# Patient Record
Sex: Male | Born: 1955 | Race: White | Hispanic: No | Marital: Single | Smoking: Never smoker
Health system: Southern US, Community
[De-identification: ages and names within clinical notes are randomized; demographics above are authoritative.]

## PROBLEM LIST (undated history)

## (undated) DIAGNOSIS — G8929 Other chronic pain: Secondary | ICD-10-CM

## (undated) DIAGNOSIS — G4730 Sleep apnea, unspecified: Secondary | ICD-10-CM

## (undated) HISTORY — PX: ABLATION OF DYSRHYTHMIC FOCUS: SHX254

## (undated) NOTE — *Deleted (*Deleted)
1030:Pt awoken for meds, pt asked to go back to sleep. Pt attempting to take a nap now. Will continue to monitor pt.

---

## 2020-02-21 ENCOUNTER — Other Ambulatory Visit: Payer: Self-pay

## 2020-02-21 ENCOUNTER — Inpatient Hospital Stay (HOSPITAL_COMMUNITY)
Admission: EM | Admit: 2020-02-21 | Discharge: 2020-03-27 | DRG: 917 | Disposition: A | Discharge status: Home / Self Care | Payer: Medicare Other | Attending: Family Medicine | Admitting: Family Medicine

## 2020-02-21 ENCOUNTER — Emergency Department (HOSPITAL_COMMUNITY): Payer: Medicare Other

## 2020-02-21 ENCOUNTER — Encounter (HOSPITAL_COMMUNITY): Payer: Self-pay | Admitting: Pulmonary Disease

## 2020-02-21 DIAGNOSIS — Z6836 Body mass index (BMI) 36.0-36.9, adult: Secondary | ICD-10-CM

## 2020-02-21 DIAGNOSIS — K625 Hemorrhage of anus and rectum: Secondary | ICD-10-CM | POA: Diagnosis not present

## 2020-02-21 DIAGNOSIS — D509 Iron deficiency anemia, unspecified: Secondary | ICD-10-CM | POA: Diagnosis present

## 2020-02-21 DIAGNOSIS — C9110 Chronic lymphocytic leukemia of B-cell type not having achieved remission: Secondary | ICD-10-CM | POA: Diagnosis present

## 2020-02-21 DIAGNOSIS — F4321 Adjustment disorder with depressed mood: Secondary | ICD-10-CM | POA: Diagnosis present

## 2020-02-21 DIAGNOSIS — S61512A Laceration without foreign body of left wrist, initial encounter: Secondary | ICD-10-CM | POA: Diagnosis present

## 2020-02-21 DIAGNOSIS — F1210 Cannabis abuse, uncomplicated: Secondary | ICD-10-CM | POA: Diagnosis present

## 2020-02-21 DIAGNOSIS — Z86711 Personal history of pulmonary embolism: Secondary | ICD-10-CM

## 2020-02-21 DIAGNOSIS — J984 Other disorders of lung: Secondary | ICD-10-CM | POA: Diagnosis present

## 2020-02-21 DIAGNOSIS — S066X9A Traumatic subarachnoid hemorrhage with loss of consciousness of unspecified duration, initial encounter: Secondary | ICD-10-CM | POA: Diagnosis present

## 2020-02-21 DIAGNOSIS — S61511D Laceration without foreign body of right wrist, subsequent encounter: Secondary | ICD-10-CM

## 2020-02-21 DIAGNOSIS — I110 Hypertensive heart disease with heart failure: Secondary | ICD-10-CM | POA: Diagnosis present

## 2020-02-21 DIAGNOSIS — R112 Nausea with vomiting, unspecified: Secondary | ICD-10-CM | POA: Diagnosis not present

## 2020-02-21 DIAGNOSIS — I482 Chronic atrial fibrillation, unspecified: Secondary | ICD-10-CM | POA: Diagnosis present

## 2020-02-21 DIAGNOSIS — E871 Hypo-osmolality and hyponatremia: Secondary | ICD-10-CM | POA: Diagnosis present

## 2020-02-21 DIAGNOSIS — W19XXXA Unspecified fall, initial encounter: Secondary | ICD-10-CM | POA: Diagnosis present

## 2020-02-21 DIAGNOSIS — K5900 Constipation, unspecified: Secondary | ICD-10-CM | POA: Diagnosis not present

## 2020-02-21 DIAGNOSIS — I252 Old myocardial infarction: Secondary | ICD-10-CM

## 2020-02-21 DIAGNOSIS — F1027 Alcohol dependence with alcohol-induced persisting dementia: Secondary | ICD-10-CM | POA: Diagnosis present

## 2020-02-21 DIAGNOSIS — X789XXA Intentional self-harm by unspecified sharp object, initial encounter: Secondary | ICD-10-CM | POA: Diagnosis present

## 2020-02-21 DIAGNOSIS — I4892 Unspecified atrial flutter: Secondary | ICD-10-CM | POA: Diagnosis present

## 2020-02-21 DIAGNOSIS — T424X2A Poisoning by benzodiazepines, intentional self-harm, initial encounter: Principal | ICD-10-CM | POA: Diagnosis present

## 2020-02-21 DIAGNOSIS — F332 Major depressive disorder, recurrent severe without psychotic features: Secondary | ICD-10-CM | POA: Diagnosis present

## 2020-02-21 DIAGNOSIS — G309 Alzheimer's disease, unspecified: Secondary | ICD-10-CM | POA: Diagnosis present

## 2020-02-21 DIAGNOSIS — E669 Obesity, unspecified: Secondary | ICD-10-CM | POA: Diagnosis present

## 2020-02-21 DIAGNOSIS — J4540 Moderate persistent asthma, uncomplicated: Secondary | ICD-10-CM | POA: Diagnosis present

## 2020-02-21 DIAGNOSIS — R45850 Homicidal ideations: Secondary | ICD-10-CM | POA: Diagnosis present

## 2020-02-21 DIAGNOSIS — Z7901 Long term (current) use of anticoagulants: Secondary | ICD-10-CM

## 2020-02-21 DIAGNOSIS — T1491XA Suicide attempt, initial encounter: Secondary | ICD-10-CM | POA: Diagnosis present

## 2020-02-21 DIAGNOSIS — M5030 Other cervical disc degeneration, unspecified cervical region: Secondary | ICD-10-CM | POA: Diagnosis present

## 2020-02-21 DIAGNOSIS — Z7982 Long term (current) use of aspirin: Secondary | ICD-10-CM

## 2020-02-21 DIAGNOSIS — Z23 Encounter for immunization: Secondary | ICD-10-CM

## 2020-02-21 DIAGNOSIS — G2581 Restless legs syndrome: Secondary | ICD-10-CM | POA: Diagnosis present

## 2020-02-21 DIAGNOSIS — S06369A Traumatic hemorrhage of cerebrum, unspecified, with loss of consciousness of unspecified duration, initial encounter: Secondary | ICD-10-CM | POA: Diagnosis present

## 2020-02-21 DIAGNOSIS — I5032 Chronic diastolic (congestive) heart failure: Secondary | ICD-10-CM | POA: Diagnosis present

## 2020-02-21 DIAGNOSIS — I609 Nontraumatic subarachnoid hemorrhage, unspecified: Secondary | ICD-10-CM

## 2020-02-21 DIAGNOSIS — M549 Dorsalgia, unspecified: Secondary | ICD-10-CM | POA: Diagnosis present

## 2020-02-21 DIAGNOSIS — F0280 Dementia in other diseases classified elsewhere without behavioral disturbance: Secondary | ICD-10-CM | POA: Diagnosis present

## 2020-02-21 DIAGNOSIS — R159 Full incontinence of feces: Secondary | ICD-10-CM | POA: Diagnosis not present

## 2020-02-21 DIAGNOSIS — T50902A Poisoning by unspecified drugs, medicaments and biological substances, intentional self-harm, initial encounter: Secondary | ICD-10-CM | POA: Diagnosis not present

## 2020-02-21 DIAGNOSIS — K5730 Diverticulosis of large intestine without perforation or abscess without bleeding: Secondary | ICD-10-CM | POA: Diagnosis present

## 2020-02-21 DIAGNOSIS — G928 Other toxic encephalopathy: Secondary | ICD-10-CM | POA: Diagnosis present

## 2020-02-21 DIAGNOSIS — N179 Acute kidney failure, unspecified: Secondary | ICD-10-CM | POA: Diagnosis present

## 2020-02-21 DIAGNOSIS — R110 Nausea: Secondary | ICD-10-CM

## 2020-02-21 DIAGNOSIS — S51812A Laceration without foreign body of left forearm, initial encounter: Secondary | ICD-10-CM | POA: Diagnosis present

## 2020-02-21 DIAGNOSIS — R1115 Cyclical vomiting syndrome unrelated to migraine: Secondary | ICD-10-CM | POA: Diagnosis not present

## 2020-02-21 DIAGNOSIS — Z7951 Long term (current) use of inhaled steroids: Secondary | ICD-10-CM

## 2020-02-21 DIAGNOSIS — Z85828 Personal history of other malignant neoplasm of skin: Secondary | ICD-10-CM

## 2020-02-21 DIAGNOSIS — I629 Nontraumatic intracranial hemorrhage, unspecified: Secondary | ICD-10-CM

## 2020-02-21 DIAGNOSIS — S51811A Laceration without foreign body of right forearm, initial encounter: Secondary | ICD-10-CM | POA: Diagnosis present

## 2020-02-21 DIAGNOSIS — Z820 Family history of epilepsy and other diseases of the nervous system: Secondary | ICD-10-CM

## 2020-02-21 DIAGNOSIS — R160 Hepatomegaly, not elsewhere classified: Secondary | ICD-10-CM | POA: Diagnosis present

## 2020-02-21 DIAGNOSIS — E512 Wernicke's encephalopathy: Secondary | ICD-10-CM | POA: Diagnosis present

## 2020-02-21 DIAGNOSIS — Z803 Family history of malignant neoplasm of breast: Secondary | ICD-10-CM

## 2020-02-21 DIAGNOSIS — I509 Heart failure, unspecified: Secondary | ICD-10-CM

## 2020-02-21 DIAGNOSIS — Z20822 Contact with and (suspected) exposure to covid-19: Secondary | ICD-10-CM | POA: Diagnosis present

## 2020-02-21 DIAGNOSIS — R441 Visual hallucinations: Secondary | ICD-10-CM | POA: Diagnosis present

## 2020-02-21 DIAGNOSIS — F1021 Alcohol dependence, in remission: Secondary | ICD-10-CM | POA: Diagnosis present

## 2020-02-21 DIAGNOSIS — Z833 Family history of diabetes mellitus: Secondary | ICD-10-CM

## 2020-02-21 DIAGNOSIS — K219 Gastro-esophageal reflux disease without esophagitis: Secondary | ICD-10-CM | POA: Diagnosis present

## 2020-02-21 DIAGNOSIS — G8929 Other chronic pain: Secondary | ICD-10-CM | POA: Diagnosis present

## 2020-02-21 DIAGNOSIS — T50902D Poisoning by unspecified drugs, medicaments and biological substances, intentional self-harm, subsequent encounter: Secondary | ICD-10-CM | POA: Diagnosis not present

## 2020-02-21 DIAGNOSIS — I272 Pulmonary hypertension, unspecified: Secondary | ICD-10-CM | POA: Diagnosis present

## 2020-02-21 DIAGNOSIS — G629 Polyneuropathy, unspecified: Secondary | ICD-10-CM | POA: Diagnosis present

## 2020-02-21 DIAGNOSIS — R590 Localized enlarged lymph nodes: Secondary | ICD-10-CM | POA: Diagnosis present

## 2020-02-21 DIAGNOSIS — F17290 Nicotine dependence, other tobacco product, uncomplicated: Secondary | ICD-10-CM | POA: Diagnosis present

## 2020-02-21 DIAGNOSIS — K229 Disease of esophagus, unspecified: Secondary | ICD-10-CM | POA: Diagnosis not present

## 2020-02-21 DIAGNOSIS — M4802 Spinal stenosis, cervical region: Secondary | ICD-10-CM | POA: Diagnosis present

## 2020-02-21 DIAGNOSIS — S61511A Laceration without foreign body of right wrist, initial encounter: Secondary | ICD-10-CM | POA: Diagnosis present

## 2020-02-21 DIAGNOSIS — R001 Bradycardia, unspecified: Secondary | ICD-10-CM | POA: Diagnosis not present

## 2020-02-21 DIAGNOSIS — G4733 Obstructive sleep apnea (adult) (pediatric): Secondary | ICD-10-CM | POA: Diagnosis present

## 2020-02-21 DIAGNOSIS — S61512D Laceration without foreign body of left wrist, subsequent encounter: Secondary | ICD-10-CM | POA: Diagnosis not present

## 2020-02-21 DIAGNOSIS — I959 Hypotension, unspecified: Secondary | ICD-10-CM | POA: Diagnosis not present

## 2020-02-21 DIAGNOSIS — Z8249 Family history of ischemic heart disease and other diseases of the circulatory system: Secondary | ICD-10-CM

## 2020-02-21 DIAGNOSIS — M62838 Other muscle spasm: Secondary | ICD-10-CM | POA: Diagnosis present

## 2020-02-21 DIAGNOSIS — R1310 Dysphagia, unspecified: Secondary | ICD-10-CM | POA: Diagnosis not present

## 2020-02-21 DIAGNOSIS — E876 Hypokalemia: Secondary | ICD-10-CM | POA: Diagnosis not present

## 2020-02-21 DIAGNOSIS — F5104 Psychophysiologic insomnia: Secondary | ICD-10-CM | POA: Diagnosis present

## 2020-02-21 HISTORY — DX: Sleep apnea, unspecified: G47.30

## 2020-02-21 HISTORY — DX: Other chronic pain: G89.29

## 2020-02-21 LAB — I-STAT ARTERIAL BLOOD GAS, ED
Acid-Base Excess: 0 mmol/L (ref 0.0–2.0)
Bicarbonate: 24.5 mmol/L (ref 20.0–28.0)
Calcium, Ion: 1.19 mmol/L (ref 1.15–1.40)
HCT: 33 % — ABNORMAL LOW (ref 39.0–52.0)
Hemoglobin: 11.2 g/dL — ABNORMAL LOW (ref 13.0–17.0)
O2 Saturation: 93 %
Potassium: 2.9 mmol/L — ABNORMAL LOW (ref 3.5–5.1)
Sodium: 133 mmol/L — ABNORMAL LOW (ref 135–145)
TCO2: 26 mmol/L (ref 22–32)
pCO2 arterial: 37 mmHg (ref 32.0–48.0)
pH, Arterial: 7.429 (ref 7.350–7.450)
pO2, Arterial: 65 mmHg — ABNORMAL LOW (ref 83.0–108.0)

## 2020-02-21 LAB — COMPREHENSIVE METABOLIC PANEL
ALT: 15 U/L (ref 0–44)
AST: 29 U/L (ref 15–41)
Albumin: 3.3 g/dL — ABNORMAL LOW (ref 3.5–5.0)
Alkaline Phosphatase: 56 U/L (ref 38–126)
Anion gap: 11 (ref 5–15)
BUN: 16 mg/dL (ref 8–23)
CO2: 22 mmol/L (ref 22–32)
Calcium: 8.3 mg/dL — ABNORMAL LOW (ref 8.9–10.3)
Chloride: 93 mmol/L — ABNORMAL LOW (ref 98–111)
Creatinine, Ser: 0.99 mg/dL (ref 0.61–1.24)
GFR calc non Af Amer: >60 mL/min (ref >60)
Glucose, Bld: 137 mg/dL — ABNORMAL HIGH (ref 70–99)
Potassium: 3.3 mmol/L — ABNORMAL LOW (ref 3.5–5.1)
Sodium: 126 mmol/L — ABNORMAL LOW (ref 135–145)
Total Bilirubin: 1.6 mg/dL — ABNORMAL HIGH (ref 0.3–1.2)
Total Protein: 5.8 g/dL — ABNORMAL LOW (ref 6.5–8.1)

## 2020-02-21 LAB — RAPID URINE DRUG SCREEN, HOSP PERFORMED
Amphetamines: NOT DETECTED
Barbiturates: NOT DETECTED
Benzodiazepines: NOT DETECTED
Cocaine: NOT DETECTED
Opiates: NOT DETECTED
Tetrahydrocannabinol: POSITIVE — AB

## 2020-02-21 LAB — CBC
HCT: 33 % — ABNORMAL LOW (ref 39.0–52.0)
Hemoglobin: 10.7 g/dL — ABNORMAL LOW (ref 13.0–17.0)
MCH: 28.2 pg (ref 26.0–34.0)
MCHC: 32.4 g/dL (ref 30.0–36.0)
MCV: 87.1 fL (ref 80.0–100.0)
Platelets: 204 10*3/uL (ref 150–400)
RBC: 3.79 MIL/uL — ABNORMAL LOW (ref 4.22–5.81)
RDW: 15 % (ref 11.5–15.5)
WBC: 27.1 10*3/uL — ABNORMAL HIGH (ref 4.0–10.5)
nRBC: 0 % (ref 0.0–0.2)

## 2020-02-21 LAB — GLUCOSE, CAPILLARY: Glucose-Capillary: 99 mg/dL (ref 70–99)

## 2020-02-21 LAB — RESPIRATORY PANEL BY RT PCR (FLU A&B, COVID)
Influenza A by PCR: NEGATIVE
Influenza B by PCR: NEGATIVE
SARS Coronavirus 2 by RT PCR: NEGATIVE

## 2020-02-21 LAB — CBG MONITORING, ED: Glucose-Capillary: 138 mg/dL — ABNORMAL HIGH (ref 70–99)

## 2020-02-21 LAB — SALICYLATE LEVEL: Salicylate Lvl: <7 mg/dL — ABNORMAL LOW (ref 7.0–30.0)

## 2020-02-21 LAB — SODIUM: Sodium: 133 mmol/L — ABNORMAL LOW (ref 135–145)

## 2020-02-21 LAB — PROTIME-INR
INR: 1.3 — ABNORMAL HIGH (ref 0.8–1.2)
INR: 1 (ref 0.8–1.2)
Prothrombin Time: 15.9 seconds — ABNORMAL HIGH (ref 11.4–15.2)
Prothrombin Time: 13.2 seconds (ref 11.4–15.2)

## 2020-02-21 LAB — ACETAMINOPHEN LEVEL: Acetaminophen (Tylenol), Serum: <10 ug/mL — ABNORMAL LOW (ref 10–30)

## 2020-02-21 LAB — MAGNESIUM: Magnesium: 2 mg/dL (ref 1.7–2.4)

## 2020-02-21 LAB — PHOSPHORUS: Phosphorus: 4 mg/dL (ref 2.5–4.6)

## 2020-02-21 LAB — HIV ANTIBODY (ROUTINE TESTING W REFLEX): HIV Screen 4th Generation wRfx: NONREACTIVE

## 2020-02-21 LAB — BRAIN NATRIURETIC PEPTIDE: B Natriuretic Peptide: 359.3 pg/mL — ABNORMAL HIGH (ref 0.0–100.0)

## 2020-02-21 LAB — MRSA PCR SCREENING: MRSA by PCR: NEGATIVE

## 2020-02-21 LAB — ETHANOL: Alcohol, Ethyl (B): <10 mg/dL (ref <10)

## 2020-02-21 MED ORDER — ALBUTEROL SULFATE (2.5 MG/3ML) 0.083% IN NEBU: 2.5000 mg | INHALATION_SOLUTION | RESPIRATORY_TRACT | Status: DC | PRN

## 2020-02-21 MED ORDER — DOPAMINE-DEXTROSE 3.2-5 MG/ML-% IV SOLN
0.0000 ug/kg/min | INTRAVENOUS | Status: DC
  Administered 2020-02-21: 5 ug/kg/min via INTRAVENOUS
  Filled 2020-02-21: qty 250

## 2020-02-21 MED ORDER — ONDANSETRON HCL 4 MG/2ML IJ SOLN
4.0000 mg | Freq: Four times a day (QID) | INTRAMUSCULAR | Status: DC | PRN
  Administered 2020-02-21 – 2020-03-27 (×23): 4 mg via INTRAVENOUS
  Filled 2020-02-21 (×26): qty 2

## 2020-02-21 MED ORDER — PROTHROMBIN COMPLEX CONC HUMAN 500 UNITS IV KIT
4713.0000 [IU] | PACK | Status: AC
  Administered 2020-02-21: 4713 [IU] via INTRAVENOUS
  Filled 2020-02-21: qty 4213

## 2020-02-21 MED ORDER — POTASSIUM CHLORIDE 10 MEQ/100ML IV SOLN: 10.0000 meq | INTRAVENOUS | Status: AC

## 2020-02-21 MED ORDER — DOCUSATE SODIUM 100 MG PO CAPS
100.0000 mg | ORAL_CAPSULE | Freq: Two times a day (BID) | ORAL | Status: DC | PRN
  Administered 2020-02-27 – 2020-03-17 (×5): 100 mg via ORAL
  Filled 2020-02-21 (×5): qty 1

## 2020-02-21 MED ORDER — NALOXONE HCL 0.4 MG/ML IJ SOLN
0.4000 mg | INTRAMUSCULAR | Status: DC | PRN
  Administered 2020-02-21: 0.4 mg via INTRAVENOUS
  Filled 2020-02-21: qty 1

## 2020-02-21 MED ORDER — DEXMEDETOMIDINE HCL IN NACL 400 MCG/100ML IV SOLN
0.2000 ug/kg/h | INTRAVENOUS | Status: DC
  Administered 2020-02-21: 0.2 ug/kg/h via INTRAVENOUS
  Filled 2020-02-21: qty 100

## 2020-02-21 MED ORDER — MIDAZOLAM HCL 2 MG/2ML IJ SOLN
INTRAMUSCULAR | Status: AC
  Filled 2020-02-21: qty 4

## 2020-02-21 MED ORDER — LIDOCAINE-EPINEPHRINE 1 %-1:100000 IJ SOLN
20.0000 mL | Freq: Once | INTRAMUSCULAR | Status: AC
  Administered 2020-02-21: 20 mL
  Filled 2020-02-21: qty 1

## 2020-02-21 MED ORDER — FAMOTIDINE IN NACL 20-0.9 MG/50ML-% IV SOLN
20.0000 mg | Freq: Two times a day (BID) | INTRAVENOUS | Status: DC
  Administered 2020-02-21 – 2020-02-23 (×5): 20 mg via INTRAVENOUS
  Filled 2020-02-21 (×5): qty 50

## 2020-02-21 MED ORDER — INFLUENZA VAC A&B SA ADJ QUAD 0.5 ML IM PRSY
0.5000 mL | PREFILLED_SYRINGE | INTRAMUSCULAR | Status: AC
  Administered 2020-02-22: 0.5 mL via INTRAMUSCULAR
  Filled 2020-02-21: qty 0.5

## 2020-02-21 MED ORDER — CHLORHEXIDINE GLUCONATE CLOTH 2 % EX PADS
6.0000 | MEDICATED_PAD | Freq: Every day | CUTANEOUS | Status: DC
  Administered 2020-02-22: 6 via TOPICAL

## 2020-02-21 MED ORDER — POTASSIUM CHLORIDE CRYS ER 20 MEQ PO TBCR
20.0000 meq | EXTENDED_RELEASE_TABLET | Freq: Once | ORAL | Status: AC
  Administered 2020-02-21: 20 meq via ORAL
  Filled 2020-02-21: qty 1

## 2020-02-21 MED ORDER — FENTANYL CITRATE (PF) 100 MCG/2ML IJ SOLN
INTRAMUSCULAR | Status: DC
Start: 2020-02-21 — End: 2020-02-21
  Filled 2020-02-21: qty 2

## 2020-02-21 MED ORDER — NALOXONE HCL 0.4 MG/ML IJ SOLN: 0.4000 mg | INTRAMUSCULAR | Status: DC | PRN

## 2020-02-21 MED ORDER — ETOMIDATE 2 MG/ML IV SOLN
INTRAVENOUS | Status: AC
  Filled 2020-02-21: qty 20

## 2020-02-21 MED ORDER — FENTANYL CITRATE (PF) 100 MCG/2ML IJ SOLN
25.0000 ug | INTRAMUSCULAR | Status: DC | PRN
  Administered 2020-02-21 (×3): 100 ug via INTRAVENOUS
  Filled 2020-02-21 (×3): qty 2

## 2020-02-21 MED ORDER — TETANUS-DIPHTH-ACELL PERTUSSIS 5-2.5-18.5 LF-MCG/0.5 IM SUSP
0.5000 mL | Freq: Once | INTRAMUSCULAR | Status: AC
  Administered 2020-02-21: 0.5 mL via INTRAMUSCULAR
  Filled 2020-02-21: qty 0.5

## 2020-02-21 MED ORDER — POLYETHYLENE GLYCOL 3350 17 G PO PACK
17.0000 g | PACK | Freq: Every day | ORAL | Status: DC | PRN
  Administered 2020-02-25 – 2020-03-03 (×7): 17 g via ORAL
  Filled 2020-02-21 (×9): qty 1

## 2020-02-21 MED ORDER — ROCURONIUM BROMIDE 10 MG/ML (PF) SYRINGE
PREFILLED_SYRINGE | INTRAVENOUS | Status: AC
  Filled 2020-02-21: qty 10

## 2020-02-21 MED ORDER — ENSURE ENLIVE PO LIQD
237.0000 mL | Freq: Two times a day (BID) | ORAL | Status: DC
  Administered 2020-02-21 – 2020-02-22 (×2): 237 mL via ORAL

## 2020-02-21 NOTE — Consult Note (Signed)
Reason for Consult: Intracranial hemorrhage Referring Physician: Emergency department  David Oneill is an 64 y.o. male.  HPI: 64 year old male status post likely suicide attempt.  Unclear as to whether there was any significant trauma to his head.  Patient amnestic to events and cannot provide history.  Patient's mental status is gradually improved.  He is awake and conversant but remains confused.  No past medical history on file.    No family history on file.  Social History:  has no history on file for tobacco use, alcohol use, and drug use.  Allergies: Not on File  Medications: I have reviewed the patient's current medications.  Results for orders placed or performed during the hospital encounter of 02/21/20 (from the past 48 hour(s))  Comprehensive metabolic panel     Status: Abnormal   Collection Time: 02/21/20  6:17 AM  Result Value Ref Range   Sodium 126 (L) 135 - 145 mmol/L   Potassium 3.3 (L) 3.5 - 5.1 mmol/L   Chloride 93 (L) 98 - 111 mmol/L   CO2 22 22 - 32 mmol/L   Glucose, Bld 137 (H) 70 - 99 mg/dL    Comment: Glucose reference range applies only to samples taken after fasting for at least 8 hours.   BUN 16 8 - 23 mg/dL   Creatinine, Ser 0.99 0.61 - 1.24 mg/dL   Calcium 8.3 (L) 8.9 - 10.3 mg/dL   Total Protein 5.8 (L) 6.5 - 8.1 g/dL   Albumin 3.3 (L) 3.5 - 5.0 g/dL   AST 29 15 - 41 U/L   ALT 15 0 - 44 U/L   Alkaline Phosphatase 56 38 - 126 U/L   Total Bilirubin 1.6 (H) 0.3 - 1.2 mg/dL   GFR calc non Af Amer >60 >60 mL/min   Anion gap 11 5 - 15    Comment: Performed at [facility], [place] [address] [place], [place] [address]  Ethanol     Status: None   Collection Time: 02/21/20  6:17 AM  Result Value Ref Range   Alcohol, Ethyl (B) <10 <10 mg/dL    Comment: (NOTE) Lowest detectable limit for serum alcohol is 10 mg/dL.  For medical purposes only. Performed at [facility], [place] [address], [place], [place] [address]   Salicylate level      Status: Abnormal   Collection Time: 02/21/20  6:17 AM  Result Value Ref Range   Salicylate Lvl <6.1 (L) 7.0 - 30.0 mg/dL    Comment: Performed at [facility] [address] [place], [place] [address]  Acetaminophen level     Status: Abnormal   Collection Time: 02/21/20  6:17 AM  Result Value Ref Range   Acetaminophen (Tylenol), Serum <10 (L) 10 - 30 ug/mL    Comment: (NOTE) Therapeutic concentrations vary significantly. A range of 10-30 ug/mL  may be an effective concentration for many patients. However, some  are best treated at concentrations outside of this range. Acetaminophen concentrations >150 ug/mL at 4 hours after ingestion  and >50 ug/mL at 12 hours after ingestion are often associated with  toxic reactions.  Performed at [facility], [place] [address] [place], [place] [address]   cbc     Status: Abnormal   Collection Time: 02/21/20  6:17 AM  Result Value Ref Range   WBC 27.1 (H) 4.0 - 10.5 K/uL   RBC 3.79 (L) 4.22 - 5.81 MIL/uL   Hemoglobin 10.7 (L) 13.0 - 17.0 g/dL  HCT 33.0 (L) 39 - 52 %   MCV 87.1 80.0 - 100.0 fL   MCH 28.2 26.0 - 34.0 pg   MCHC 32.4 30.0 - 36.0 g/dL   RDW 15.0 11.5 - 15.5 %   Platelets 204 150 - 400 K/uL   nRBC 0.0 0.0 - 0.2 %    Comment: Performed at [facility], [place] [address], [place], [place] [address]  Protime-INR     Status: Abnormal   Collection Time: 02/21/20  6:44 AM  Result Value Ref Range   Prothrombin Time 15.9 (H) 11.4 - 15.2 seconds   INR 1.3 (H) 0.8 - 1.2    Comment: (NOTE) INR goal varies based on device and disease states. Performed at [facility], [place] [address], [place], [place] [address]   Respiratory Panel by RT PCR (Flu A&B, Covid) - Nasopharyngeal Swab     Status: None   Collection Time: 02/21/20  6:58 AM   Specimen: Nasopharyngeal Swab  Result Value Ref Range   SARS Coronavirus 2 by RT PCR NEGATIVE NEGATIVE    Comment: (NOTE) SARS-CoV-2 target nucleic acids are NOT DETECTED.  The  SARS-CoV-2 RNA is generally detectable in upper respiratoy specimens during the acute phase of infection. The lowest concentration of SARS-CoV-2 viral copies this assay can detect is 131 copies/mL. A negative result does not preclude SARS-Cov-2 infection and should not be used as the sole basis for treatment or other patient management decisions. A negative result may occur with  improper specimen collection/handling, submission of specimen other than nasopharyngeal swab, presence of viral mutation(s) within the areas targeted by this assay, and inadequate number of viral copies (<131 copies/mL). A negative result must be combined with clinical observations, patient history, and epidemiological information. The expected result is Negative.  Fact Sheet for Patients:  PinkCheek.be  Fact Sheet for Healthcare Providers:  GravelBags.it  This test is no t yet approved or cleared by the [place] FDA and  has been authorized for detection and/or diagnosis of SARS-CoV-2 by FDA under an Emergency Use Authorization (EUA). This EUA will remain  in effect (meaning this test can be used) for the duration of the COVID-19 declaration under Section 564(b)(1) of the Act, 21 U.S.C. section 360bbb-3(b)(1), unless the authorization is terminated or revoked sooner.     Influenza A by PCR NEGATIVE NEGATIVE   Influenza B by PCR NEGATIVE NEGATIVE    Comment: (NOTE) The Xpert Xpress SARS-CoV-2/FLU/RSV assay is intended as an aid in  the diagnosis of influenza from Nasopharyngeal swab specimens and  should not be used as a sole basis for treatment. Nasal washings and  aspirates are unacceptable for Xpert Xpress SARS-CoV-2/FLU/RSV  testing.  Fact Sheet for Patients: PinkCheek.be  Fact Sheet for Healthcare Providers: GravelBags.it  This test is not yet approved or cleared by the [place] FDA and  has been authorized for detection and/or diagnosis of SARS-CoV-2 by  FDA under an Emergency Use Authorization (EUA). This EUA will remain  in effect (meaning this test can be used) for the duration of the  Covid-19 declaration under Section 564(b)(1) of the Act, 21  U.S.C. section 360bbb-3(b)(1), unless the authorization is  terminated or revoked. Performed at [facility], [place] [address] [place], [place] [address]   CBG monitoring, ED     Status: Abnormal   Collection Time: 02/21/20  7:39 AM  Result Value Ref Range   Glucose-Capillary 138 (H) 70 - 99 mg/dL  Comment: Glucose reference range applies only to samples taken after fasting for at least 8 hours.   Comment 1 Notify RN    Comment 2 Document in Chart   Rapid urine drug screen (hospital performed)     Status: Abnormal   Collection Time: 02/21/20 10:00 AM  Result Value Ref Range   Opiates NONE DETECTED NONE DETECTED   Cocaine NONE DETECTED NONE DETECTED   Benzodiazepines NONE DETECTED NONE DETECTED   Amphetamines NONE DETECTED NONE DETECTED   Tetrahydrocannabinol POSITIVE (A) NONE DETECTED   Barbiturates NONE DETECTED NONE DETECTED    Comment: (NOTE) DRUG SCREEN FOR MEDICAL PURPOSES ONLY.  IF CONFIRMATION IS NEEDED FOR ANY PURPOSE, NOTIFY LAB WITHIN 5 DAYS.  LOWEST DETECTABLE LIMITS FOR URINE DRUG SCREEN Drug Class                     Cutoff (ng/mL) Amphetamine and metabolites    1000 Barbiturate and metabolites    200 Benzodiazepine                 833 Tricyclics and metabolites     300 Opiates and metabolites        300 Cocaine and metabolites        300 THC                            50 Performed at [facility], [place] [address] [place], [place] [address]     CT Head Wo Contrast  Result Date: 02/21/2020 CLINICAL DATA:  Mental status change.  Intentional drug overdose. EXAM: CT HEAD WITHOUT CONTRAST TECHNIQUE: Contiguous axial images were obtained from the base of the skull  through the vertex without intravenous contrast. COMPARISON:  None. FINDINGS: Brain: Superficial left occipital hematoma measuring 18 mm. Small volume adjacent subarachnoid blood. No contusion or hemorrhage seen in the contralateral occipital lobe. The presence of trauma is unknown by the clinical team given the altered mental status. No generalized findings of posterior reversible encephalopathy. No infarct, hydrocephalus, or midline shift. Vascular: No hyperdense vessel or unexpected calcification. Skull: Normal. Negative for fracture or focal lesion. Scar-like appearance to the right temporal scalp. Sinuses/Orbits: No acute finding. Retention cyst appearance in the left maxillary sinus. Nasal trumpet in place. Other: Critical Value/emergent results were called by telephone at the time of interpretation on 02/21/2020 at 7:45 am to provider [facility] , who verbally acknowledged these results. IMPRESSION: 18 mm left occipital hematoma with small volume subarachnoid extension. Electronically Signed   By: Monte Fantasia M.D.   On: 02/21/2020 07:45   DG Chest Portable 1 View  Result Date: 02/21/2020 CLINICAL DATA:  Altered mental status EXAM: PORTABLE CHEST 1 VIEW COMPARISON:  None. FINDINGS: Cardiomegaly. Hazy opacity at the bases. There is no edema, air bronchogram, effusion, or pneumothorax. No acute osseous finding. Diffuse degenerative endplate spurring IMPRESSION: 1. Low volume chest with atelectatic appearance at bases. 2. Cardiomegaly. Electronically Signed   By: Monte Fantasia M.D.   On: 02/21/2020 07:02    Review of systems not obtained due to patient factors. Blood pressure (!) 94/58, pulse (!) 27, temperature (!) 97.5 F (36.4 C), temperature source Oral, resp. rate 14, height 6\' 1"  (1.854 m), weight 127 kg, SpO2 96 %. Awake and aware.  Oriented times person and year.  Speech fluent.  Cranial nerve function normal.  Motor and sensory exam appear intact in all extremities.  No outward signs  of  head trauma.  Oropharynx nasopharynx and external Auditory canals clear.  Chest and abdomen benign.  Assessment/Plan: Small left occipital cortical hemorrhage of unclear etiology but likely nontraumatic.  Situation complicated by Eliquis usage.  Agree with [facility] and ICU observation.  Should get follow-up head CT scan tomorrow.  No indication for neurosurgical intervention at present.  Mallie Mussel A Belvin Gauss 02/21/2020, 11:52 AM

## 2020-02-21 NOTE — ED Notes (Signed)
PA suturing right wrist at bedside.

## 2020-02-21 NOTE — ED Provider Notes (Signed)
Patient seen and examined, agree with assessment and plan by APP. Patient with SI, cut his wrists, ? Overdose, found on the neighbor's porch, has an ICH in occipital lobe on Xarelto. He is somnolent, does not rouse to verbal stimuli but maintaining airway. Bradycardic. Will reverse xarelto, anticipate ICU admission.    Truddie Hidden, MD 02/21/20 [phone]

## 2020-02-21 NOTE — H&P (Addendum)
NAME:  David Oneill, MRN:  [id number], DOB:  05/07/1956, LOS: 0 ADMISSION DATE:  02/21/2020, CONSULTATION DATE:  02/21/2020 REFERRING MD:  Junious Silk, CHIEF COMPLAINT:  OD, Suicide attempt   Brief History   David Oneill is a 64 y.o. male presenting 02/21/2020  for evaluation of Suicidal Intent  and drug overdose. Pt. Slit both wrists ( on Xarelto) and overdosed on his Oxycodone and Restoril. He fell and hit the back of  his head. Mental status is altered.He did receive Narcan 2 mg by EMS with little response.  CT Head shows  small occipital bleed with subarachnoid extension. No signs of head trauma. No mass effect. Eppie Gibson is currently infusing to reverse Xarelto in setting of head bleed.  . Additionally patient has bradycardia with rates dropping into the 30's.He was seen at [facility] yesterday due to abdominal pain, and discharged. PCCM have been asked to admit and manage care.  History of present illness   David Oneill is a 64 y.o. male with history of A. fib, on Xarelto, s/p ablation x 2, restrictive lung disease, OSA, HTN,  Large PEs 11/2019 prompting an NSTEMI, started on heparin and subsequently developed a chest hematoma, CLL with baseline white count of 20-30, history of alcohol abuse and .  Additionally, he has been  diagnosed with likely early stages of Alzheimer's versus possible dementia per [facility].  He also has  a history of Warnicke's encephalopathy diagnosed early 2020. [name]   Pt. Presented  10/5  for evaluation of Suicidal Intent and drug overdose. Mental status was altered on presentation. Pt. Slit both wrists ( on Xarelto) and overdosed on his Oxycodone( per neighbor at the scene, patient told him)  and Restoril. Per EMS this was an attempt to kill himself. He fell and hit the back of  his head on his daughters porch. He has had homicidal thoughts to hurt towards his daughter  and her children . She is afraid for her families safety, and can no longer olive with  him. She is his health care POA. Mental status was  altered on presentation . CT Head shows  small occipital bleed with subarachnoid extension. No signs of head trauma. No mass effect. Eppie Gibson is currently infusing to reverse Xarelto in setting of head bleed. Additionally patient has bradycardia with rates dropping into the 30's. Respirations are somnolent at times, but currently protecting airway and oxygenating well with sats of 94-98%.   Initially, only responsive to pain, however he has gradually improved to where he was responsive to voice.  When he is awake, he is alert to person, place, time.  He is unable to describe what happened.  Unable to state exactly what he took, when he took it, how much.  Patient with several large lacerations of the forearms, however he remains  grossly neurovascularly intact.   Drug screen, EKG, chest x-ray were ordered in the ED, and CT head was done as patient is on blood thinners. .   Labs show significant leukocytosis of 27, but when compared to yesterday [facility])  this is similar.  Patient with a history of CLL, currently being ops.  As he is without fever or known infectious symptoms, no antibiotics were initiated Labs were reassuring.  Chest x-ray shows no obvious pneumonia.  There is mild cardiomegaly.  Labs in ED :  Salicylate Lvl 7.0 - 86.5 mg/dL <7.0Low    Acetaminophen (Tylenol), Serum 10 - 30 ug/mL <10Low     Ethanol Level <  10 Na 126 K 3.3 Chloride 93 Creatinine 0.99 Total Bili 1.6 AST 29 ALT 15 WBC 27.1 HGB 10.7 Platelets 204 PT 15.9 INR 1.3 SARS Coronavirus 2>> Negative Flu A & B >> Negative Drug screen pending Lactate 10/4 at [facility] was 1.3 Troponin was 15  PCCM have been asked to admit and manage care.  Past Medical History  . Acute renal failure ([place]) 01/01/2020  . Atrial fibrillation ([place])  . Basal cell carcinoma  . Chronic neck pain  . Headache  . History of chronic back pain  . History of neuropathy  .  Hypertension  . Hypertension with goal to be determined  . Insomnia  . Moderate persistent asthma without complication 07/19/4399  . Neck pain  . Neuropathy  . NSTEMI (non-ST elevated myocardial infarction) ([place]) 11/27/2019  . OSA (obstructive sleep apnea)  . Restless leg syndrome  . Restrictive lung disease  . Sleep apnea  . Squamous cell carcinoma  . Stomach burning  due to smoking Hemp  . Syncope 01/01/2020  . Urinary tract infection  . Weight gain  Chest hematoma Ablation x 2  Significant Hospital Events   10/5 Admission for SI, OD and slashed wrists  Consults:  Neuro surgery  Procedures:    Significant Diagnostic Tests:  10/5 CT Head Superficial left occipital hematoma measuring 18 mm. Small volume adjacent subarachnoid blood. No contusion or hemorrhage seen in the contralateral occipital lobe. The presence of trauma is unknown by the clinical team given the altered mental status. No generalized findings of posterior reversible encephalopathy. No infarct, hydrocephalus, or midline shift.  Vascular: No hyperdense vessel or unexpected calcification.  Skull: Normal. Negative for fracture or focal lesion. Scar-like appearance to the right temporal scalp.  Sinuses/Orbits: No acute finding. Retention cyst appearance in the left maxillary sinus. Nasal trumpet in place.  Other: Critical Value/emergent results were called by telephone at the time of interpretation on 02/21/2020 at 7:45 am to provider [facility] , who verbally acknowledged these results.  IMPRESSION: 18 mm left occipital hematoma with small volume subarachnoid Extension.  10/5: CXR  Cardiomegaly. Hazy opacity at the bases. There is no edema, air bronchogram, effusion, or pneumothorax. No acute osseous finding. Diffuse degenerative endplate spurring 1. Low volume chest with atelectatic appearance at bases. 2. Cardiomegaly.  CT Abdomen 10/4 [facility] No evidence of mesenteric ischemia.  Patent vasculature.  2. Interval progression of adenopathy in the lower chest, abdomen, and pelvis.  3. Additional ancillary findings as detailed above.   Micro Data:  10/5: SARS Coronavirus 2>> Negative 10/5: Flu A & B >> Negative   Antimicrobials:  None  Interim history/subjective:  More awake with stimulation GCS 13 Remains lethargic  Bradycardia per Tele into the upper 30's atrial fib per EKG, QTc 456 ms  Objective   Blood pressure (!) 94/58, pulse (!) 27, temperature (!) 97.5 F (36.4 C), temperature source Oral, resp. rate 14, height 6\' 1"  (1.854 m), weight 127 kg, SpO2 96 %.       No intake or output data in the 24 hours ending 02/21/20 1007 Filed Weights   02/21/20 0614  Weight: 127 kg    Examination: General: Obtunded elderly male, currently protecting airway, on RA in NAD HENT: [place], No LAD, No JVD, PERRLA 2 mm and sluggish to respond Lungs: Bilateral chest excursion, clear throughout, diminished per bases Cardiovascular: S1, S2, IRR, fib with rate 30-50, No RMG Abdomen: Soft, NT, ND, BS diminished, Body mass index is 36.94 kg/m. Extremities:Bandages to wrists  bilaterally, warm and dry, brisk refill, scrapes and abrasions to arms bilaterally Neuro: Obtunded, arouses to call of mane and stimulation for short intervals, follws afew commands before falling back to sleep GU: Condom cath with good UO  Resolved Hospital Problem list     Assessment & Plan:  18 mm left occipital hematoma with small volume subarachnoid Extension. S/p fall, on blood thinners INR 1.3 Plan Appreciate Neuro Surgery consult No immediate intervention due at this time  Frequent Neuro Checks Q 2 Repeat CT Head per neuro surgery  Kaycentra given in ED on 10/5 ( INR was 1.3) Trend CBC and co-ags No heparin/ blood thinners   AMS in setting of  Drug OD and head trauma after fall Per neighbor oxycodone OD Plan Urine Drug Screen ( Pending) No sedation Frequent re-orientation Fall  risk  At risk for inability to protect airway Hx of OSA Plan ICU admission Monitor sats Titrate Oxygen to maintain sats of > 92% Consider CPAP as needed   Hyponatremia in setting of ETOH abuse( ? Beer Potomania) Na 126 K 3,3  Plan  Trend BMET Replete electrolytes as needed  Check Serum sodium now and Q 6 Replete K ( 2 runs now) Mag now  No IVF unless symptomatic IV meds in dextrose    A fib/ bradycardia Hx. Ablations x 2 Plan Tele  EKG in am and prn QTc monitoring Hold home Lopressor for now Consider cards consult Consider Echo Follow urine drug screen for potential drugs causing QTc abnormalities/ Arrythmias   CLL Baseline WBC 20-30>> WBC or 27K on 10/5 Afebrile  Plan Trend CBC Trend fever curve Culture as is clinically indicated Follow up with [facility] Oncology at discharge    Self inflicted injuries to wrists bilaterally Sutured in the ED No tendon or vascular damage noted in ED Full ROM bilaterally Plan Monitor neurovascular status Tetanus in ED 10/5 Wound care, monitor for drainage, redness or swelling Consider wound nurse consult  Best practice:  Diet: NPO for now Pain/Anxiety/Delirium protocol (if indicated):  VAP protocol (if indicated): NA DVT prophylaxis: PAS hose GI prophylaxis: Pepcid Glucose control: CBG Q 4 with SSI Mobility: BR for now Code Status: Full Family Communication: Daughter updated by ED staff Disposition: ICU  Labs   CBC: Recent Labs  Lab 02/21/20 0617  WBC 27.1*  HGB 10.7*  HCT 33.0*  MCV 87.1  PLT 601    Basic Metabolic Panel: Recent Labs  Lab 02/21/20 0617  NA 126*  K 3.3*  CL 93*  CO2 22  GLUCOSE 137*  BUN 16  CREATININE 0.99  CALCIUM 8.3*   GFR: Estimated Creatinine Clearance: 103.9 mL/min (by C-G formula based on SCr of 0.99 mg/dL). Recent Labs  Lab 02/21/20 0617  WBC 27.1*    Liver Function Tests: Recent Labs  Lab 02/21/20 0617  AST 29  ALT 15  ALKPHOS 56  BILITOT 1.6*    PROT 5.8*  ALBUMIN 3.3*   No results for input(s): LIPASE, AMYLASE in the last 168 hours. No results for input(s): AMMONIA in the last 168 hours.  ABG No results found for: PHART, PCO2ART, PO2ART, HCO3, TCO2, ACIDBASEDEF, O2SAT   Coagulation Profile: Recent Labs  Lab 02/21/20 0644  INR 1.3*    Cardiac Enzymes: No results for input(s): CKTOTAL, CKMB, CKMBINDEX, TROPONINI in the last 168 hours.  HbA1C: No results found for: HGBA1C  CBG: Recent Labs  Lab 02/21/20 0739  GLUCAP 138*    Review of Systems:   Unable>> Obtunded  Past Medical History  He,  has no past medical history on file.   Surgical History   CARDIAC ELECTROPHYSIOLOGY STUDY AND ABLATION 2012  x 2  . CERVICAL MEDIAL BRANCH BLOCK Left 09/23/2018  Procedure: CERVICAL MEDIAL BRANCH BLOCK C2-C3, TON ; Surgeon: London Sheer, MD; Location: [facility] [facility]; Service: Mittie Bodo; Laterality: Left;  . CERVICAL MEDIAL BRANCH BLOCK Left 10/13/2018  Procedure: CERVICAL MEDIAL BRANCH BLOCK C2-C3, TON Left (2nd block); Surgeon: London Sheer, MD; Location: [facility] [place] [facility]; Service: Mittie Bodo; Laterality: Left;  . CERVICAL MEDIAL BRANCH BLOCK Left 04/06/2019  Procedure: CERVICAL MEDIAL BRANCH BLOCK C3-6; Surgeon: London Sheer, MD; Location: [facility] [place] [facility]; Service: [facility] Darter Physiatry; Laterality: Left;  . COLONOSCOPY  . RADIOFREQUENCY ABLATION NERVES Left 11/11/2018  Procedure: RADIOFREQUENCY MEDIAL DENERVATION -CERVICAL left C2-C3; Surgeon: London Sheer, MD; Location: [facility] [place] [facility]; Service: Scheryl Darter Physiatry; Laterality: Left; patient is on Coumdin and must have a normal INR ie1.2 or less prior to his procedure  . SKIN BIOPSY  . TONSILLECTOMY  . WISDOM TOOTH EXTRACTION     Social History    Use  . Smoking status: Never Smoker  . Smokeless tobacco: Never Used  Vaping Use  . Vaping Use: Every day  . Substances: CBD,  HEMP  . Devices: Disposable  Substance Use Topics  . Alcohol use: Not Currently  Comment: none in over a year  . Drug use: Not Currently  Comment: last ~ 02/2017: HEMP now      Family History    Breast cancer Mother  . Hypertension Mother  . Diabetes Father  . Hypertension Father  . Hypertension Sister  . Hypertension Brother  . Hypertension Sister  . Allergic rhinitis Daughter  . Cancer Neg Hx  . Psoriasis Neg Hx  . Eczema Neg Hx   Allergies Not on File   Home Medications  Prior to Admission medications   Medication Sig Start Date End Date Taking? Authorizing Provider  acetaminophen (TYLENOL) 325 MG tablet Take 325 mg by mouth every 4 (four) hours as needed for mild pain.  01/01/20   [provider]  albuterol (VENTOLIN HFA) 108 (90 Base) MCG/ACT inhaler Inhale 1-2 puffs into the lungs every 6 (six) hours as needed for wheezing or shortness of breath.  08/24/19   [provider]  aspirin 81 MG EC tablet Take 81 mg by mouth daily.  12/07/19   [provider]  budesonide-formoterol (SYMBICORT) 160-4.5 MCG/ACT inhaler Inhale 2 puffs into the lungs daily.  01/01/20 03/31/20  [provider]  cyanocobalamin 1000 MCG tablet Take 1,000 mcg by mouth daily.  07/09/17   [provider]  DULoxetine (CYMBALTA) 60 MG capsule Take 60 mg by mouth daily.  10/27/19   [provider]  fluticasone (FLONASE) 50 MCG/ACT nasal spray Place 2 sprays into both nostrils daily as needed for allergies.  06/17/18   [provider]  melatonin 3 MG TABS tablet Take 3 mg by mouth. 12/07/19   [provider]  metoprolol tartrate (LOPRESSOR) 25 MG tablet Take 12.5 mg by mouth in the morning and at bedtime. 01/01/20   [provider]  ondansetron (ZOFRAN-ODT) 4 MG disintegrating tablet Take 4 mg by mouth every 8 (eight) hours as needed for nausea.  02/20/20 02/23/20  [provider]  oxyCODONE (OXY IR/ROXICODONE) 5 MG immediate  release tablet Take 10 mg by mouth every 8 (eight) hours as needed for moderate pain.     [provider]  pantoprazole (PROTONIX) 40 MG tablet Take 40 mg by mouth daily.  12/07/19   [provider]  promethazine (PHENERGAN) 25 MG tablet Take 25 mg by mouth every 6 (six) hours as needed for nausea.  12/27/19   [provider]  rOPINIRole (REQUIP) 1 MG tablet Take 2 mg by mouth at bedtime.  10/02/19   [provider]  rosuvastatin (CRESTOR) 40 MG tablet Take 40 mg by mouth daily.  12/07/19   [provider]  thiamine 500 MG tablet Take 1,000 mg by mouth daily.  02/09/19   [provider]  tiZANidine (ZANAFLEX) 4 MG tablet Take 4-8 mg by mouth every 8 (eight) hours as needed for muscle spasms.  02/01/20   [provider]  XARELTO 20 MG TABS tablet Take 20 mg by mouth at bedtime. 12/29/19   [provider]     Critical care time: 38 minutes    Magdalen Spatz, MSN, AGACNP-BC [place] for personal pager PCCM on call pager [phone] 02/21/2020 11:31 AM

## 2020-02-21 NOTE — ED Notes (Signed)
2mg  of Narcan given per ZN.BVAPOLI

## 2020-02-21 NOTE — ED Notes (Signed)
Pt to CT via stretcher

## 2020-02-21 NOTE — Progress Notes (Signed)
Brief update Admitted to 4N 24 Awake and alert Dopamine started for HR in the 40's BP remains soft Prn Narcan for decreased LOC Suicide sitter at bedside  Magdalen Spatz, MSN, AGACNP-BC [place] for personal pager PCCM on call pager [phone] 02/21/2020 2:25 PM

## 2020-02-21 NOTE — ED Notes (Signed)
PT IS OBTUNDED -CCM at bedside. Orders received.

## 2020-02-21 NOTE — ED Notes (Signed)
cbg 138

## 2020-02-21 NOTE — Progress Notes (Signed)
2130 SI sitter called this RN into the room because pt speech was more garbled and confused. Pt confused and no longer able to make fluid sentences about situation. Elink called and Narcan given d/t AMS.   2200 pt became agitated, attempting to gag himself, trying to get out of bed and yelling. New order for precedex gtt.  2330 pt is on 0.6 of precedex (max) and continues to gag himself with his finger attempting to make himself vomit.  RN will continue to monitor

## 2020-02-21 NOTE — Progress Notes (Signed)
eLink Physician-Brief Progress Note Patient Name: David Oneill DOB: 1955-10-02 MRN: [id number]   Date of Service  02/21/2020  HPI/Events of Note  Severe agitation - Patient trying to get out of bed.   eICU Interventions  Plan: 1. Precedex IV infusion (low dose) 0.2 to 0.6 mcg/kg/min Titrate to RASS = 0.      Intervention Category Major Interventions: Delirium, psychosis, severe agitation - evaluation and management  Corneisha Alvi Eugene 02/21/2020, 10:23 PM

## 2020-02-21 NOTE — ED Triage Notes (Signed)
Pt arrived via [facility] for eval of SI & overdose on Restoril. Pt found on neighbor's porch; per EMS, daughter lives 2 doors down from pt, stated that pt expressed intent; was seen & DC from hospital yesterday, attempt was made to IVC but was unsuccessful. Pt presents with bilateral wrist lacerations, bleeding controlled with gauze on arrival. Pt presents with pinpoint pupils, given 2mg  Narcan IV PTA, little response. Pt responsive to pain, CA&Ox4 when aroused.  VSS on arrival 20# to L anterior [facility]

## 2020-02-21 NOTE — ED Provider Notes (Addendum)
[facility]ERG[facility]Y DEPARTMENT Provider Note   CSN: [id number] Arrival date & time: 02/21/20  0551     History Chief Complaint  Patient presents with  . Suicidal  . Drug Overdose    David Oneill is a 64 y.o. male presenting for evaluation of SI and drug overdose.   Level V caveat due to AMS.   Pt unable to provide any history due to altered mental status.  Per EMS, patient to slit his wrists and attempt to kill himself.  He is reporting SI recently.  He was seen at another hospital recently, unknown hospital or complaint.  Per triage note, patient overdosed on Restoril.  Additional history obtained from patient's daughter, Welford Roche.  She reports patient has been reporting SI as well as homicidal thoughts to hurt towards her and her children.  He was seen at [facility] yesterday due to abdominal pain.  Due to concerns for her safety and the safety of her children, she told patient yesterday that he will not be able to continue living with him.  Daughter reports patient has a history of A. fib, on Xarelto.  In July he had large PEs prompting an NSTEMI.  He was started on heparin and subsequently developed a chest hematoma.  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  Patient's daughter is his healthcare power of attorney.  Additional history obtained from [place].  Patient told neighbor on scene that he overdosed on his oxycodone.  Pill bottle is visible in the truck window, however was not able to be counted.  No other pill bottles noted.  Additional history obtained from chart review.  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 fib status post ablation x2, restrictive lung disease,  OSA, hypertension, PE and subsequent NSTEMI on Xarelto.  HPI     No past medical history on file.  There are no problems to display for this patient.    No family history on file.  Social History   Tobacco Use  . Smoking status: Not on file  Substance Use Topics  . Alcohol use: Not on file  . Drug use: Not on file    Home Medications Prior to Admission medications   Medication Sig Start Date End Date Taking? Authorizing Provider  acetaminophen (TYLENOL) 325 MG tablet Take 325 mg by mouth every 4 (four) hours as needed for mild pain.  01/01/20   [provider]  albuterol (VENTOLIN HFA) 108 (90 Base) MCG/ACT inhaler Inhale 1-2 puffs into the lungs every 6 (six) hours as needed for wheezing or shortness of breath.  08/24/19   [provider]  aspirin 81 MG EC tablet Take 81 mg by mouth daily.  12/07/19   [provider]  budesonide-formoterol (SYMBICORT) 160-4.5 MCG/ACT inhaler Inhale 2 puffs into the lungs daily.  01/01/20 03/31/20  [provider]  cyanocobalamin 1000 MCG tablet Take 1,000 mcg by mouth daily.  07/09/17   [provider]  DULoxetine (CYMBALTA) 60 MG capsule Take 60 mg by mouth daily.  10/27/19   [provider]  fluticasone (FLONASE) 50 MCG/ACT nasal spray Place 2 sprays into both nostrils daily as needed for allergies.  06/17/18   [provider]  melatonin 3 MG TABS tablet Take  3 mg by mouth. 12/07/19   [provider]  metoprolol tartrate (LOPRESSOR) 25 MG tablet Take 12.5 mg by mouth in the morning and at bedtime. 01/01/20   [provider]  ondansetron (ZOFRAN-ODT) 4 MG disintegrating tablet Take 4 mg by mouth every 8 (eight) hours as needed for nausea.  02/20/20 02/23/20  [provider]  oxyCODONE (OXY IR/ROXICODONE) 5 MG immediate release tablet Take 10 mg by mouth every 8 (eight) hours as needed for moderate pain.     [provider]  pantoprazole (PROTONIX) 40 MG  tablet Take 40 mg by mouth daily.  12/07/19   [provider]  promethazine (PHENERGAN) 25 MG tablet Take 25 mg by mouth every 6 (six) hours as needed for nausea.  12/27/19   [provider]  rOPINIRole (REQUIP) 1 MG tablet Take 2 mg by mouth at bedtime.  10/02/19   [provider]  rosuvastatin (CRESTOR) 40 MG tablet Take 40 mg by mouth daily.  12/07/19   [provider]  thiamine 500 MG tablet Take 1,000 mg by mouth daily.  02/09/19   [provider]  tiZANidine (ZANAFLEX) 4 MG tablet Take 4-8 mg by mouth every 8 (eight) hours as needed for muscle spasms.  02/01/20   [provider]  XARELTO 20 MG TABS tablet Take 20 mg by mouth at bedtime. 12/29/19   [provider]    Allergies    Patient has no allergy information on record.  Review of Systems   Review of Systems  Unable to perform ROS: Mental status change  Hematological: Bruises/bleeds easily.  Psychiatric/Behavioral: Positive for confusion, self-injury and suicidal ideas.    Physical Exam Updated Vital Signs BP (!) 94/58   Pulse (!) 27   Temp (!) 97.5 F (36.4 C) (Oral)   Resp 14   Ht 6\' 1"  (1.854 m)   Wt 127 kg   SpO2 96%   BMI 36.94 kg/m   Physical Exam Vitals and nursing note reviewed.  Constitutional:      General: He is not in acute distress.    Appearance: He is well-developed.  HENT:     Head: Normocephalic and atraumatic.  Eyes:     Extraocular Movements: Extraocular movements intact.     Conjunctiva/sclera: Conjunctivae normal.     Comments: Pinpoint pupils  Cardiovascular:     Rate and Rhythm: Tachycardia present. Rhythm irregular.     Pulses: Normal pulses.     Comments: Bradycardic between 45 and 55.  Irregular. Pulmonary:     Effort: Pulmonary effort is normal. No respiratory distress.     Breath sounds: Normal breath sounds. No wheezing.  Abdominal:     General: There is no distension.     Palpations: Abdomen is soft. There is no mass.      Tenderness: There is no abdominal tenderness. There is no guarding or rebound.  Musculoskeletal:        General: Normal range of motion.     Cervical back: Normal range of motion and neck supple.     Right lower leg: No edema.     Left lower leg: No edema.     Comments: Large laceration of the right distal forearm with minimal bleeding.  Exam limited due to mental status, however patient with intact grip strength and good cap refill. 2 lacerations of the distal left forearm with no active bleeding. intact grip strength and good distal cap refill  Radial pulses 2+ bilaterally.  Skin:  General: Skin is warm and dry.     Capillary Refill: Capillary refill takes less than 2 seconds.  Neurological:     GCS: GCS eye subscore is 2. GCS verbal subscore is 4. GCS motor subscore is 6.     Comments: Initial GCS of 12, improved to 13 over time.      ED Results / Procedures / Treatments   Labs (all labs ordered are listed, but only abnormal results are displayed) Labs Reviewed  COMPREHENSIVE METABOLIC PANEL - Abnormal; Notable for the following components:      Result Value   Sodium 126 (*)    Potassium 3.3 (*)    Chloride 93 (*)    Glucose, Bld 137 (*)    Calcium 8.3 (*)    Total Protein 5.8 (*)    Albumin 3.3 (*)    Total Bilirubin 1.6 (*)    All other components within normal limits  SALICYLATE LEVEL - Abnormal; Notable for the following components:   Salicylate Lvl <1.9 (*)    All other components within normal limits  ACETAMINOPHEN LEVEL - Abnormal; Notable for the following components:   Acetaminophen (Tylenol), Serum <10 (*)    All other components within normal limits  CBC - Abnormal; Notable for the following components:   WBC 27.1 (*)    RBC 3.79 (*)    Hemoglobin 10.7 (*)    HCT 33.0 (*)    All other components within normal limits  PROTIME-INR - Abnormal; Notable for the following components:   Prothrombin Time 15.9 (*)    INR 1.3 (*)    All other components within  normal limits  CBG MONITORING, ED - Abnormal; Notable for the following components:   Glucose-Capillary 138 (*)    All other components within normal limits  RESPIRATORY PANEL BY RT PCR (FLU A&B, COVID)  ETHANOL  RAPID URINE DRUG SCREEN, HOSP PERFORMED  DRUG SCREEN 10 W/CONF, SERUM  DRUG SCREEN 10 W/CONF, SERUM    EKG EKG Interpretation  Date/Time:  Tuesday February 21 2020 06:15:04 EDT Ventricular Rate:  55 PR Interval:    QRS Duration: 112 QT Interval:  493 QTC Calculation: 472 R Axis:   86 Text Interpretation: Atrial fibrillation Consider anterior infarct No prior ecg for comparison Confirmed by Veryl Speak [phone]) on 02/21/2020 6:20:46 AM   Radiology CT Head Wo Contrast  Result Date: 02/21/2020 CLINICAL DATA:  Mental status change.  Intentional drug overdose. EXAM: CT HEAD WITHOUT CONTRAST TECHNIQUE: Contiguous axial images were obtained from the base of the skull through the vertex without intravenous contrast. COMPARISON:  None. FINDINGS: Brain: Superficial left occipital hematoma measuring 18 mm. Small volume adjacent subarachnoid blood. No contusion or hemorrhage seen in the contralateral occipital lobe. The presence of trauma is unknown by the clinical team given the altered mental status. No generalized findings of posterior reversible encephalopathy. No infarct, hydrocephalus, or midline shift. Vascular: No hyperdense vessel or unexpected calcification. Skull: Normal. Negative for fracture or focal lesion. Scar-like appearance to the right temporal scalp. Sinuses/Orbits: No acute finding. Retention cyst appearance in the left maxillary sinus. Nasal trumpet in place. Other: Critical Value/emergent results were called by telephone at the time of interpretation on 02/21/2020 at 7:45 am to provider [facility] , who verbally acknowledged these results. IMPRESSION: 18 mm left occipital hematoma with small volume subarachnoid extension. Electronically Signed   By: Monte Fantasia  M.D.   On: 02/21/2020 07:45   DG Chest Portable 1 View  Result Date: 02/21/2020 CLINICAL  DATA:  Altered mental status EXAM: PORTABLE CHEST 1 VIEW COMPARISON:  None. FINDINGS: Cardiomegaly. Hazy opacity at the bases. There is no edema, air bronchogram, effusion, or pneumothorax. No acute osseous finding. Diffuse degenerative endplate spurring IMPRESSION: 1. Low volume chest with atelectatic appearance at bases. 2. Cardiomegaly. Electronically Signed   By: Monte Fantasia M.D.   On: 02/21/2020 07:02    Procedures .Marland KitchenLaceration Repair  Date/Time: 02/21/2020 9:20 AM Performed by: Franchot Heidelberg, PA-C Authorized by: Franchot Heidelberg, PA-C   Consent:    Consent obtained:  Verbal Anesthesia (see MAR for exact dosages):    Anesthesia method:  Local infiltration   Local anesthetic:  Lidocaine 2% WITH epi Laceration details:    Location:  Shoulder/arm   Shoulder/arm location:  R lower arm   Length (cm):  5   Depth (mm):  4 Repair type:    Repair type:  Simple Pre-procedure details:    Preparation:  Patient was prepped and draped in usual sterile fashion Exploration:    Hemostasis achieved with:  Cautery   Wound exploration: wound explored through full range of motion and entire depth of wound probed and visualized     Wound extent: no tendon damage noted and no vascular damage noted   Treatment:    Area cleansed with:  Saline   Amount of cleaning:  Standard Skin repair:    Repair method:  Sutures   Suture size:  3-0   Suture material:  Prolene   Suture technique:  Horizontal mattress   Number of sutures:  5 Approximation:    Approximation:  Close Post-procedure details:    Dressing:  Sterile dressing   Patient tolerance of procedure:  Tolerated well, no immediate complications .Marland KitchenLaceration Repair  Date/Time: 02/21/2020 9:21 AM Performed by: Franchot Heidelberg, PA-C Authorized by: Franchot Heidelberg, PA-C   Consent:    Consent obtained:  Verbal Anesthesia (see MAR for exact  dosages):    Anesthesia method:  Local infiltration   Local anesthetic:  Lidocaine 2% WITH epi Laceration details:    Location:  Shoulder/arm   Shoulder/arm location:  L lower arm   Length (cm):  3   Depth (mm):  2 Repair type:    Repair type:  Simple Pre-procedure details:    Preparation:  Patient was prepped and draped in usual sterile fashion Exploration:    Hemostasis achieved with:  Cautery   Wound exploration: wound explored through full range of motion and entire depth of wound probed and visualized     Wound extent: no vascular damage noted   Treatment:    Area cleansed with:  Saline   Amount of cleaning:  Standard Skin repair:    Repair method:  Sutures   Suture size:  3-0   Suture material:  Prolene   Suture technique:  Simple interrupted   Number of sutures:  4 Approximation:    Approximation:  Close Post-procedure details:    Dressing:  Sterile dressing   Patient tolerance of procedure:  Tolerated well, no immediate complications .Marland KitchenLaceration Repair  Date/Time: 02/21/2020 9:22 AM Performed by: Franchot Heidelberg, PA-C Authorized by: Franchot Heidelberg, PA-C   Consent:    Consent obtained:  Verbal Anesthesia (see MAR for exact dosages):    Anesthesia method:  Local infiltration   Local anesthetic:  Lidocaine 2% WITH epi Laceration details:    Location:  Shoulder/arm   Shoulder/arm location:  L lower arm   Length (cm):  4   Depth (mm):  3 Repair type:  Repair type:  Simple Pre-procedure details:    Preparation:  Patient was prepped and draped in usual sterile fashion Exploration:    Wound exploration: wound explored through full range of motion and entire depth of wound probed and visualized     Wound extent: no tendon damage noted and no vascular damage noted   Treatment:    Area cleansed with:  Saline   Amount of cleaning:  Standard Skin repair:    Repair method:  Sutures   Suture size:  3-0   Suture material:  Prolene   Suture technique:  Simple  interrupted   Number of sutures:  4 Approximation:    Approximation:  Close Post-procedure details:    Dressing:  Sterile dressing   Patient tolerance of procedure:  Tolerated well, no immediate complications .Critical Care Performed by: Franchot Heidelberg, PA-C Authorized by: Franchot Heidelberg, PA-C   Critical care provider statement:    Critical care time (minutes):  50   Critical care time was exclusive of:  Separately billable procedures and treating other patients and teaching time   Critical care was necessary to treat or prevent imminent or life-threatening deterioration of the following conditions:  CNS failure or compromise   Critical care was time spent personally by me on the following activities:  Blood draw for specimens, development of treatment plan with patient or surrogate, evaluation of patient's response to treatment, examination of patient, obtaining history from patient or surrogate, ordering and performing treatments and interventions, ordering and review of laboratory studies, ordering and review of radiographic studies, review of old charts, pulse oximetry and re-evaluation of patient's condition   I assumed direction of critical care for this patient from another provider in my specialty: no   Comments:     Pt with intracranial bleed on xarelto, kcentra started and pt admitted to ICU   (including critical care time)  Medications Ordered in ED Medications  lidocaine-EPINEPHrine (XYLOCAINE W/EPI) 1 %-1:100000 (with pres) injection 20 mL (20 mLs Infiltration Given 02/21/20 0828)  Tdap (BOOSTRIX) injection 0.5 mL (0.5 mLs Intramuscular Given 02/21/20 0828)  prothrombin complex conc human (KCENTRA) IVPB 4,713 Units (4,713 Units Intravenous New Bag/Given 02/21/20 0827)    ED Course  I have reviewed the triage vital signs and the nursing notes.  Pertinent labs & imaging results that were available during my care of the patient were reviewed by me and considered in my  medical decision making (see chart for details).    MDM Rules/Calculators/A&P                          Patient presenting for evaluation of altered mental status and suicidal attempt.  On exam, patient is altered.  Initially, only responsive to pain, however he gradually improved to where he was responsive to voice.  When he is awake, he is alert to person, place, time.  He is unable to describe what happened.  Unable to state exactly what he took, when he took it, how much.  Patient with several large lacerations of the forearms, however he is grossly neurovascularly intact.  Will obtain labs including drug screen, EKG, chest x-ray.  As patient is on a blood thinner, will obtain a CT head.  Labs show significant leukocytosis of 27, but when compared to yesterday this is similar.  Patient with a history of CLL, currently being ops.  As he is without fever or known infectious symptoms, will hold on antibiotics.  Otherwise labs are  reassuring.  Chest x-ray viewed interpreted by me, no obvious pneumonia.  There is mild cardiomegaly.  CT head shows small occipital bleed with subarachnoid extension. No signs of head trauma. No mass effect.  Discussed with pharmacy, recommend starting patient on Kcentra.  Will consult with neurosurgery.  Discussed with Dr. Trenton Gammon from neurosurgery, who states there is no immediate intervention that needs to be done at this time.  His team will continue to consult.  Lacerations repaired as described above.  Tetanus updated.  IVC paperwork filled out.   Discussed with S Minor from critical care, patient to be admitted.  Final Clinical Impression(s) / ED Diagnoses Final diagnoses:  Intracranial bleed ([place])  Suicide attempt [facility])  Intentional drug overdose, initial encounter [facility])    Rx / [place] Orders ED Discharge Orders    None       Franchot Heidelberg, PA-C 02/21/20 [place], Roberto Hlavaty, PA-C 02/21/20 1833    Truddie Hidden, MD 02/21/20 1225

## 2020-02-21 NOTE — ED Notes (Signed)
Pt is awake, answering questions appropriately- thinks he is at [facility]-- re-oriented. Does not remember what happened. This nurse removed nasal trumpet without any difficulty.

## 2020-02-22 ENCOUNTER — Inpatient Hospital Stay (HOSPITAL_COMMUNITY): Payer: Medicare Other

## 2020-02-22 ENCOUNTER — Encounter (HOSPITAL_COMMUNITY): Payer: Self-pay | Admitting: Pulmonary Disease

## 2020-02-22 DIAGNOSIS — I609 Nontraumatic subarachnoid hemorrhage, unspecified: Secondary | ICD-10-CM

## 2020-02-22 DIAGNOSIS — T1491XA Suicide attempt, initial encounter: Secondary | ICD-10-CM

## 2020-02-22 HISTORY — DX: Suicide attempt, initial encounter: T14.91XA

## 2020-02-22 LAB — BASIC METABOLIC PANEL
Anion gap: 11 (ref 5–15)
BUN: 15 mg/dL (ref 8–23)
CO2: 23 mmol/L (ref 22–32)
Calcium: 8.7 mg/dL — ABNORMAL LOW (ref 8.9–10.3)
Chloride: 101 mmol/L (ref 98–111)
Creatinine, Ser: 1.21 mg/dL (ref 0.61–1.24)
GFR calc non Af Amer: >60 mL/min (ref >60)
Glucose, Bld: 126 mg/dL — ABNORMAL HIGH (ref 70–99)
Potassium: 3.5 mmol/L (ref 3.5–5.1)
Sodium: 135 mmol/L (ref 135–145)

## 2020-02-22 LAB — HEPATIC FUNCTION PANEL
ALT: 23 U/L (ref 0–44)
AST: 42 U/L — ABNORMAL HIGH (ref 15–41)
Albumin: 3.5 g/dL (ref 3.5–5.0)
Alkaline Phosphatase: 58 U/L (ref 38–126)
Bilirubin, Direct: 0.3 mg/dL — ABNORMAL HIGH (ref 0.0–0.2)
Indirect Bilirubin: 1.2 mg/dL — ABNORMAL HIGH (ref 0.3–0.9)
Total Bilirubin: 1.5 mg/dL — ABNORMAL HIGH (ref 0.3–1.2)
Total Protein: 6.2 g/dL — ABNORMAL LOW (ref 6.5–8.1)

## 2020-02-22 LAB — CBC
HCT: 36.4 % — ABNORMAL LOW (ref 39.0–52.0)
Hemoglobin: 11.8 g/dL — ABNORMAL LOW (ref 13.0–17.0)
MCH: 28 pg (ref 26.0–34.0)
MCHC: 32.4 g/dL (ref 30.0–36.0)
MCV: 86.5 fL (ref 80.0–100.0)
Platelets: 197 10*3/uL (ref 150–400)
RBC: 4.21 MIL/uL — ABNORMAL LOW (ref 4.22–5.81)
RDW: 15.1 % (ref 11.5–15.5)
WBC: 24 10*3/uL — ABNORMAL HIGH (ref 4.0–10.5)
nRBC: 0 % (ref 0.0–0.2)

## 2020-02-22 LAB — PHOSPHORUS: Phosphorus: 4.7 mg/dL — ABNORMAL HIGH (ref 2.5–4.6)

## 2020-02-22 LAB — MAGNESIUM: Magnesium: 2 mg/dL (ref 1.7–2.4)

## 2020-02-22 MED ORDER — METOCLOPRAMIDE HCL 5 MG/ML IJ SOLN
5.0000 mg | Freq: Once | INTRAMUSCULAR | Status: AC
  Administered 2020-02-22: 5 mg via INTRAVENOUS
  Filled 2020-02-22: qty 2

## 2020-02-22 MED ORDER — ENSURE ENLIVE PO LIQD
237.0000 mL | Freq: Three times a day (TID) | ORAL | Status: DC
  Administered 2020-02-23 – 2020-02-29 (×17): 237 mL via ORAL

## 2020-02-22 MED ORDER — ACETAMINOPHEN 325 MG PO TABS
650.0000 mg | ORAL_TABLET | Freq: Four times a day (QID) | ORAL | Status: DC | PRN
  Administered 2020-02-23 – 2020-03-27 (×46): 650 mg via ORAL
  Filled 2020-02-22 (×46): qty 2

## 2020-02-22 MED ORDER — ADULT MULTIVITAMIN W/MINERALS CH
1.0000 | ORAL_TABLET | Freq: Every day | ORAL | Status: DC
  Administered 2020-02-23 – 2020-03-27 (×34): 1 via ORAL
  Filled 2020-02-22 (×34): qty 1

## 2020-02-22 MED ORDER — MOMETASONE FURO-FORMOTEROL FUM 100-5 MCG/ACT IN AERO
2.0000 | INHALATION_SPRAY | Freq: Two times a day (BID) | RESPIRATORY_TRACT | Status: DC
  Administered 2020-02-23 – 2020-03-27 (×60): 2 via RESPIRATORY_TRACT
  Filled 2020-02-22 (×3): qty 8.8

## 2020-02-22 MED ORDER — THIAMINE HCL 100 MG PO TABS
100.0000 mg | ORAL_TABLET | Freq: Every day | ORAL | Status: DC
  Administered 2020-02-23 – 2020-02-28 (×6): 100 mg via ORAL
  Filled 2020-02-22 (×6): qty 1

## 2020-02-22 NOTE — Progress Notes (Signed)
Overall stable through the night.  Currently awake and alert.  Pleasantly confused and conversant.  Denies significant headache.  Motor and sensory exam intact.  Follow-up head CT scan not performed yet.  Overall stable.  Patient is small left occipital likely traumatic contusion.  Recommend follow-up head CT scan to evaluate for progression of hemorrhage or new contusions becoming more apparent.  No other recommendations from my standpoint.

## 2020-02-22 NOTE — Progress Notes (Signed)
°   02/22/20 1345  Clinical Encounter Type  Visited With Patient  Visit Type Initial  Referral From Nurse  Consult/Referral To Chaplain  Stress Factors  Patient Stress Factors Family relationships;Health changes;Loss of control   Chaplain responded to referral. Chaplain provided emotional support. Pt appeared to be uncomfortable. Chaplain encouraged Pt and engaged in active listening. Chaplain remains available as needed.  This note was prepared by Chaplain Resident, Dante Gang, MDiv. For questions, please contact by phone at [phone].

## 2020-02-22 NOTE — Progress Notes (Signed)
Initial Nutrition Assessment  DOCUMENTATION CODES:   Obesity unspecified  INTERVENTION:  Increase Ensure Enlive to po TID, each supplement provides 350 kcal and 20 grams of protein  MVI daily   NUTRITION DIAGNOSIS:   Inadequate oral intake related to decreased appetite as evidenced by meal completion < 50%.    GOAL:   Patient will meet greater than or equal to 90% of their needs    MONITOR:   PO intake, Supplement acceptance, Weight trends, Skin, Labs, I & O's  REASON FOR ASSESSMENT:   Malnutrition Screening Tool    ASSESSMENT:   Pt admitted with AMS 2/2 intentional drug OD and head trauma after fall. Pt also slit both wrists. PMH includes A.fib, s/p ablation x2, restrictive lung disease, OSA, HTN, large PEs 11/2019 prompting NSTEMI, CLL, and h/o EtOH abuse. 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   Pt sleeping at time of RD visit and did not wake to RD voice/touch. Discussed pt with SI sitter. Per sitter, pt is typically very talkative. Pt informed sitter that he did not eat for 4 days PTA. Observed meal tray, pt did not eat well. However, per sitter and RN, pt did consume Ensure Enlive well (ordered BID).  PO intake: 25% x 1 recorded meal  Per Care Everywhere, pt weighed 250 lbs on 11/27/2019. Pt now weighs 233.91 lbs. This indicates a 6.4% wt loss x3 months, which is insignificant for time frame.   UOP: 3444ml x24 hours I/O: -2447.37ml since admit  Labs: Phosphorus 4.7 (H) Medications: thiamine, IV pepcid  NUTRITION - FOCUSED PHYSICAL EXAM:    Most Recent Value  Orbital Region No depletion  Upper Arm Region Mild depletion  Thoracic and Lumbar Region No depletion  Buccal Region No depletion  Temple Region No depletion  Clavicle Bone Region No depletion  Clavicle and Acromion Bone Region No depletion  Scapular Bone Region No depletion  Dorsal Hand No depletion  Patellar Region No depletion  Anterior Thigh Region  Mild depletion  Posterior Calf Region Mild depletion  Edema (RD Assessment) None  Hair Reviewed  Eyes Reviewed  Mouth Reviewed  Skin Reviewed  Nails Reviewed       Diet Order:   Diet Order            Diet regular Room service appropriate? Yes with Assist; Fluid consistency: Thin  Diet effective now                 EDUCATION NEEDS:   Not appropriate for education at this time  Skin:  Skin Assessment: Skin Integrity Issues: Skin Integrity Issues:: Other (Comment) Other: lacerations bilateral wrists  Last BM:  10/3  Height:   Ht Readings from Last 1 Encounters:  02/21/20 6' (1.829 m)    Weight:   Wt Readings from Last 1 Encounters:  02/21/20 106.1 kg    Ideal Body Weight:  80.91 kg  BMI:  Body mass index is 31.72 kg/m.  Estimated Nutritional Needs:   Kcal:  2600-2800  Protein:  130-150 grams  Fluid:  >/=2.6L/d    Larkin Ina, MS, RD, LDN RD pager number and weekend/on-call pager number located in [place].

## 2020-02-22 NOTE — Plan of Care (Signed)

## 2020-02-22 NOTE — Progress Notes (Signed)
CSW spoke with Welford Roche, pt daughter and POA.  She has emailed POA paperwork to pt RN.  She was asking about help with locating assisted living placement for pt once he is DC from geropsych or whichever department he ends up at the hospital.  Pt does have disability income at present, along with Medicare and a supplement.  She gave the following history: pt has lived with [place], her husband, and their young children for 3.5 years due to being unable to function independently.  Pt has history of alcohol issues that she has controlled by keeping pt from having access.  Pt also has history of using pain pills and currently is smoking CBD.  Pt has never been willing to seek any psychiatric treatment.  Things have deteriorated enough that [place] and her husband informed pt on Monday, 10/4, that he would no longer be able to live with them.  Pt has since been in the ED once and then had the current suicide attempt after his discharge from the ED.  Pt is not interested in living in a facility and told [place] that he had plenty of family options where he could stay but Vito Backers said that extended family are not going to be willing to take pt in either.  Pt was at SNF once before and was pretty uncooperative and she is not sure he will cooperate in an ALF either.  CSW discussed with pt that psych has been consulted and will see what they recommend, that there will be social workers working with pt but that securing ALF placement is not simple.  She also said that they did take out IVC paperwork after the suicide attempt and were told that pt would be served at the hospital.  Lurline Idol, MSW, LCSW 10/6/20214:03 PM

## 2020-02-22 NOTE — Progress Notes (Addendum)
NAME:  David Oneill, MRN:  [id number], DOB:  1955/07/05, LOS: 1 ADMISSION DATE:  02/21/2020, CONSULTATION DATE:  02/21/2020 REFERRING MD:  Junious Silk, CHIEF COMPLAINT:  OD, Suicide attempt   Brief History   David Oneill is a 64 y.o. male presenting 02/21/2020  for evaluation of Suicidal Intent  and drug overdose. Pt. Slit both wrists ( on Xarelto) and overdosed on his Oxycodone and Restoril. He fell and hit the back of  his head. Mental status is altered.He did receive Narcan 2 mg by EMS with little response.  CT Head shows  small occipital bleed with subarachnoid extension. No signs of head trauma. No mass effect. David Oneill is currently infusing to reverse Xarelto in setting of head bleed.  . Additionally patient has bradycardia with rates dropping into the 30's.He was seen at [facility] yesterday due to abdominal pain, and discharged. PCCM have been asked to admit and manage care.  History of present illness   David Oneill is a 64 y.o. male with history of A. fib, on Xarelto, s/p ablation x 2, restrictive lung disease, OSA, HTN,  Large PEs 11/2019 prompting an NSTEMI, started on heparin and subsequently developed a chest hematoma, CLL with baseline white count of 20-30, history of alcohol abuse and .  Additionally, he has been  diagnosed with likely early stages of Alzheimer's versus possible dementia per [facility].  He also has  a history of Warnicke's encephalopathy diagnosed early 2020. [name]   Pt. Presented  10/5  for evaluation of Suicidal Intent and drug overdose. Mental status was altered on presentation. Pt. Slit both wrists ( on Xarelto) and overdosed on his Oxycodone( per neighbor at the scene, patient told him)  and Restoril. Per EMS this was an attempt to kill himself. He fell and hit the back of  his head on his daughters porch. He has had homicidal thoughts to hurt towards his daughter  and her children . She is afraid for her families safety, and can no longer olive with  him. She is his health care POA. Mental status was  altered on presentation . CT Head shows  small occipital bleed with subarachnoid extension. No signs of head trauma. No mass effect. David Oneill is currently infusing to reverse Xarelto in setting of head bleed. Additionally patient has bradycardia with rates dropping into the 30's. Respirations are somnolent at times, but currently protecting airway and oxygenating well with sats of 94-98%.   Past Medical History  . Acute renal failure ([place]) 01/01/2020  . Atrial fibrillation ([place])  . Basal cell carcinoma  . Chronic neck pain  . Headache  . History of chronic back pain  . History of neuropathy  . Hypertension  . Hypertension with goal to be determined  . Insomnia  . Moderate persistent asthma without complication 0/10/3014  . Neck pain  . Neuropathy  . NSTEMI (non-ST elevated myocardial infarction) ([place]) 11/27/2019  . OSA (obstructive sleep apnea)  . Restless leg syndrome  . Restrictive lung disease  . Sleep apnea  . Squamous cell carcinoma  . Stomach burning  due to smoking Hemp  . Syncope 01/01/2020  . Urinary tract infection  . Weight gain  Chest hematoma Ablation x 2  Significant Hospital Events   10/5 Admission for SI, OD and slashed wrists  Consults:  Neuro surgery  Procedures:  N/A  Significant Diagnostic Tests:  10/5 CT Head 18 mm left occipital hematoma with small volume subarachnoid Extension. 10/5: CXR Cardiomegaly. Hazy opacity at the bases.  There is no edema, air bronchogram, effusion, or pneumothorax. No acute osseous finding. Diffuse degenerative endplate spurring 35/3 CT Head: Stable and unchanged hematoma with small volume subarachnoid hemorrhage   Micro Data:  10/5: SARS Coronavirus 2>> Negative 10/5: Flu A & B >> Negative  Antimicrobials:  None  Interim history/subjective:   Overnight, David Oneill was noted to be quite agitated and requiring the initiation of Precedex drip.  He was trying to gag  himself and has been nauseous.  He reports to me this morning that he did vomit small volume amounts.  In addition, patient developed bradycardia, requiring the initiation of dopamine drip.  This morning, both Precedex and dopamine drips were able to be weaned off this morning.  David Oneill states he feels fine at the moment.  He denies any headaches, dizziness, chest pain, palpitations, weakness.  We discussed the events that brought him to the hospital including his wrist injury.  David Oneill states he has never intentionally hurt himself before he does not quite remember cutting his wrists.  He notes he has been having difficulty with his family, as his daughter and son-in-law live by him. They are now moving to another home and do not want David Oneill to come with him.  He states his only support system is his daughter and son-in-law, but does not feel like they are there for him.  This is what led to that events that brought him in.  Mr. Panel/not remember following including his hemorrhage.  He denies any drug or alcohol use, but notes that he took 2 to 3 tablets of his prescribed tizanidine.  States he has been on tizanidine for an extended period of time and takes frequently for headache and neck pain.  Objective   Blood pressure (!) 88/76, pulse 75, temperature 98.4 F (36.9 C), temperature source Oral, resp. rate 14, height 6' (1.829 m), weight 106.1 kg, SpO2 100 %.        Intake/Output Summary (Last 24 hours) at 02/22/2020 0801 Last data filed at 02/22/2020 0600 Gross per 24 hour  Intake 511.51 ml  Output 3400 ml  Net -2888.49 ml   Filed Weights   02/21/20 0614 02/21/20 1307  Weight: 127 kg 106.1 kg   Physical Exam Vitals and nursing note reviewed.  Constitutional:      General: He is not in acute distress.    Appearance: He is overweight.  HENT:     Head: Normocephalic.     Mouth/Throat:     Mouth: Mucous membranes are moist.     Pharynx: Oropharynx is clear.  Eyes:      General: No visual field deficit or scleral icterus.    Extraocular Movements: Extraocular movements intact.     Conjunctiva/sclera: Conjunctivae normal.     Pupils: Pupils are equal, round, and reactive to light.  Cardiovascular:     Rate and Rhythm: Normal rate and regular rhythm.     Heart sounds: No murmur heard.   Pulmonary:     Effort: Pulmonary effort is normal. No respiratory distress.     Breath sounds: Normal breath sounds. No wheezing, rhonchi or rales.  Abdominal:     General: Bowel sounds are normal. There is no distension.     Palpations: Abdomen is soft.     Tenderness: There is no abdominal tenderness. There is no guarding.  Musculoskeletal:        General: No tenderness or signs of injury.     Right wrist: Laceration (3 cm  horizontal laceration to the wrist that is sutured.  There is significant tenting of the skin between the sutures with very little bloody oozing.) present. Normal range of motion. Normal pulse.     Left wrist: Laceration (4 cm horizontal laceration that is clean and dry with sutures intact.  Several superficial laceration surrounding this as well.) present. Normal range of motion. Normal pulse.     Right lower leg: No edema.     Left lower leg: No edema.  Skin:    General: Skin is warm and dry.  Neurological:     Mental Status: He is alert and oriented to person, place, and time.     Cranial Nerves: No dysarthria or facial asymmetry.     Sensory: Sensation is intact.     Motor: No weakness.     Comments: 5 out of 5 strength in bilateral upper and lower extremities.   Psychiatric:        Attention and Perception: Attention normal.        Speech: Speech is tangential.        Behavior: Behavior is not agitated, aggressive or withdrawn. Behavior is cooperative.    Resolved Hospital Problem list   Hyponatremia   Assessment & Plan:   # Subdural Hematoma with Subarachnoid Extension  - Secondary to trauma in light of anti-coagulant use (Xarelto)    - Xarelto reversed with Kcentra in the ED - Neurosurgery consulted; appreciate their recommendations - Repeat head CT stable - No neurological deficits on examination - Continue holding any coagulation  - Stable for transfer to floor  # Acute Encephalopathy:  - Likely multifactorial in light of head trauma + drug overdose + hx of ? dementia  - Patient declines any drug use but per ED note, neighbor endorses Oxycodone use.  - Initial UDS negative, extended UDS pending  - Overnight, patient did become agitated requiring Precedex and Narcan. This morning, he is back to baseline and AOx3. Wonder if aspect of delirium/sundowning.  - Discontinue opioids for now - If patient c/o pain, Tylenol has been ordered.   # Atrial Fibrillation/Flutter  - Hx of prior ablations without success. On Xarelto and Lopressor prior to admission  - Telemetry reviewed; patient continues to fluctuate between sinus and flutter without RVR - Holding Lopressor in light of bradycardia and hypotension overnight; consider restarting if vitals improve.  - Holding Xarelto given SAH  # Bradycardia  # Hx of First-Degree AVB - Likely secondary to Precedex. Improved today since drip was stopped.  - Monitor closely when using AV nodal blocking agents.   # Suicide Attempt  # Bilateral Wrist Lacerations  - Lacerations healing well at this time - Psychiatry consulted  - Sitter at bedside, suicide precautions   # HFrEF with recovered EF - Euvolemic on examination today - Patient is not on diuretics at home  - Continue to monitor   # Hx of Massive PE  - Massive PE c/b NSTEMI in July 2021. Patient on Warfarin at the time but subtherapeutic. Transitioned to [place] at this time due to [facility] - Will discuss with neurosurgery on restarting anticoagulation   # Leukocytosis # Hx of CLL - Improving - Outpatient follow up with Oncology   # Chronic Pain - Tizanidine strongly recommended to be discontinued. Given hx of  hypotension and hallucinations, this is a high risk medication for the patient. May have contributed to worsening mental status  - Avoid opioids given the need for Narcan twice in the  past 24 hours.   Best practice:  Diet: Regular  Pain/Anxiety/Delirium protocol (if indicated):  VAP protocol (if indicated): N/A DVT prophylaxis: PAS hose GI prophylaxis: Pepcid Glucose control: N/A Mobility: Up with assistance  Code Status: Full Family Communication:  Disposition: ICU  Labs   CBC: Recent Labs  Lab 02/21/20 0617 02/21/20 1208 02/22/20 0445  WBC 27.1*  --  24.0*  HGB 10.7* 11.2* 11.8*  HCT 33.0* 33.0* 36.4*  MCV 87.1  --  86.5  PLT 204  --  151    Basic Metabolic Panel: Recent Labs  Lab 02/21/20 0617 02/21/20 1144 02/21/20 1208 02/22/20 0445  NA 126* 133* 133* 135  K 3.3*  --  2.9* 3.5  CL 93*  --   --  101  CO2 22  --   --  23  GLUCOSE 137*  --   --  126*  BUN 16  --   --  15  CREATININE 0.99  --   --  1.21  CALCIUM 8.3*  --   --  8.7*  MG  --  2.0  --  2.0  PHOS  --  4.0  --  4.7*   GFR: Estimated Creatinine Clearance: 76.6 mL/min (by C-G formula based on SCr of 1.21 mg/dL). Recent Labs  Lab 02/21/20 0617 02/22/20 0445  WBC 27.1* 24.0*    Liver Function Tests: Recent Labs  Lab 02/21/20 0617 02/22/20 0445  AST 29 42*  ALT 15 23  ALKPHOS 56 58  BILITOT 1.6* 1.5*  PROT 5.8* 6.2*  ALBUMIN 3.3* 3.5   No results for input(s): LIPASE, AMYLASE in the last 168 hours. No results for input(s): AMMONIA in the last 168 hours.  ABG    Component Value Date/Time   PHART 7.429 02/21/2020 1208   PCO2ART 37.0 02/21/2020 1208   PO2ART 65 (L) 02/21/2020 1208   HCO3 24.5 02/21/2020 1208   TCO2 26 02/21/2020 1208   O2SAT 93.0 02/21/2020 1208     Coagulation Profile: Recent Labs  Lab 02/21/20 0644 02/21/20 1144  INR 1.3* 1.0    Cardiac Enzymes: No results for input(s): CKTOTAL, CKMB, CKMBINDEX, TROPONINI in the last 168 hours.  HbA1C: No results  found for: HGBA1C  CBG: Recent Labs  Lab 02/21/20 0739 02/21/20 2158  GLUCAP 138* 99    Review of Systems:   Negative except as noted above.   Past Medical History  He,  has no past medical history on file.   Surgical History   CARDIAC ELECTROPHYSIOLOGY STUDY AND ABLATION 2012  x 2  . CERVICAL MEDIAL BRANCH BLOCK Left 09/23/2018  Procedure: CERVICAL MEDIAL BRANCH BLOCK C2-C3, TON ; Surgeon: London Sheer, MD; Location: [facility]; Service: Mittie Bodo; Laterality: Left;  . CERVICAL MEDIAL BRANCH BLOCK Left 10/13/2018  Procedure: CERVICAL MEDIAL BRANCH BLOCK C2-C3, TON Left (2nd block); Surgeon: London Sheer, MD; Location: [facility]; Service: Mittie Bodo; Laterality: Left;  . CERVICAL MEDIAL BRANCH BLOCK Left 04/06/2019  Procedure: CERVICAL MEDIAL BRANCH BLOCK C3-6; Surgeon: London Sheer, MD; Location: [facility]; Service: [facility]; Laterality: Left;  . COLONOSCOPY  . RADIOFREQUENCY ABLATION NERVES Left 11/11/2018  Procedure: RADIOFREQUENCY MEDIAL DENERVATION -CERVICAL left C2-C3; Surgeon: London Sheer, MD; Location: [facility]; Service: Sc[facility] Darter Physiat[facility]; Laterality: Left; patient is on Coumdin and must have a normal INR ie1.2 or less prior to his procedure  . SKIN BIOPSY  . TONSILLECTOMY  . WISDOM TOOTH EXTRACTION  Social History    Use  . Smoking status: Never Smoker  . Smokeless tobacco: Never Used  Vaping Use  . Vaping Use: Every day  . Substances: CBD, HEMP  . Devices: Disposable  Substance Use Topics  . Alcohol use: Not Currently  Comment: none in over a year  . Drug use: Not Currently  Comment: last ~ 02/2017: HEMP now      Family History    Breast cancer Mother  . Hypertension Mother  . Diabetes Father  . Hypertension Father  . Hypertension Sister  . Hypertension Brother  . Hypertension Sister  . Allergic rhinitis Daughter  .  Cancer Neg Hx  . Psoriasis Neg Hx  . Eczema Neg Hx   Allergies Not on File   Home Medications  Prior to Admission medications   Medication Sig Start Date End Date Taking? Authorizing Provider  acetaminophen (TYLENOL) 325 MG tablet Take 325 mg by mouth every 4 (four) hours as needed for mild pain.  01/01/20   [provider]  albuterol (VENTOLIN HFA) 108 (90 Base) MCG/ACT inhaler Inhale 1-2 puffs into the lungs every 6 (six) hours as needed for wheezing or shortness of breath.  08/24/19   [provider]  aspirin 81 MG EC tablet Take 81 mg by mouth daily.  12/07/19   [provider]  budesonide-formoterol (SYMBICORT) 160-4.5 MCG/ACT inhaler Inhale 2 puffs into the lungs daily.  01/01/20 03/31/20  [provider]  cyanocobalamin 1000 MCG tablet Take 1,000 mcg by mouth daily.  07/09/17   [provider]  DULoxetine (CYMBALTA) 60 MG capsule Take 60 mg by mouth daily.  10/27/19   [provider]  fluticasone (FLONASE) 50 MCG/ACT nasal spray Place 2 sprays into both nostrils daily as needed for allergies.  06/17/18   [provider]  melatonin 3 MG TABS tablet Take 3 mg by mouth. 12/07/19   [provider]  metoprolol tartrate (LOPRESSOR) 25 MG tablet Take 12.5 mg by mouth in the morning and at bedtime. 01/01/20   [provider]  ondansetron (ZOFRAN-ODT) 4 MG disintegrating tablet Take 4 mg by mouth every 8 (eight) hours as needed for nausea.  02/20/20 02/23/20  [provider]  oxyCODONE (OXY IR/ROXICODONE) 5 MG immediate release tablet Take 10 mg by mouth every 8 (eight) hours as needed for moderate pain.     [provider]  pantoprazole (PROTONIX) 40 MG tablet Take 40 mg by mouth daily.  12/07/19   [provider]  promethazine (PHENERGAN) 25 MG tablet Take 25 mg by mouth every 6 (six) hours as needed for nausea.  12/27/19   [provider]  rOPINIRole (REQUIP) 1 MG tablet Take 2 mg by mouth  at bedtime.  10/02/19   [provider]  rosuvastatin (CRESTOR) 40 MG tablet Take 40 mg by mouth daily.  12/07/19   [provider]  thiamine 500 MG tablet Take 1,000 mg by mouth daily.  02/09/19   [provider]  tiZANidine (ZANAFLEX) 4 MG tablet Take 4-8 mg by mouth every 8 (eight) hours as needed for muscle spasms.  02/01/20   [provider]  XARELTO 20 MG TABS tablet Take 20 mg by mouth at bedtime. 12/29/19   [provider]     Dr. Jose Persia Internal Medicine PGY-2  Pager: [phone] After 5pm on weekdays and 1pm on weekends: On Call pager [phone]  02/22/2020, 1:30 PM

## 2020-02-22 NOTE — Progress Notes (Signed)
Transported patient to [id number] with all belongings.

## 2020-02-23 ENCOUNTER — Encounter (HOSPITAL_COMMUNITY): Payer: Self-pay | Admitting: Pulmonary Disease

## 2020-02-23 DIAGNOSIS — T50902A Poisoning by unspecified drugs, medicaments and biological substances, intentional self-harm, initial encounter: Secondary | ICD-10-CM | POA: Diagnosis not present

## 2020-02-23 DIAGNOSIS — I629 Nontraumatic intracranial hemorrhage, unspecified: Secondary | ICD-10-CM

## 2020-02-23 DIAGNOSIS — T1491XA Suicide attempt, initial encounter: Secondary | ICD-10-CM | POA: Diagnosis present

## 2020-02-23 LAB — COMPREHENSIVE METABOLIC PANEL
ALT: 24 U/L (ref 0–44)
AST: 43 U/L — ABNORMAL HIGH (ref 15–41)
Albumin: 3.7 g/dL (ref 3.5–5.0)
Alkaline Phosphatase: 60 U/L (ref 38–126)
Anion gap: 12 (ref 5–15)
BUN: 13 mg/dL (ref 8–23)
CO2: 24 mmol/L (ref 22–32)
Calcium: 8.8 mg/dL — ABNORMAL LOW (ref 8.9–10.3)
Chloride: 101 mmol/L (ref 98–111)
Creatinine, Ser: 1.07 mg/dL (ref 0.61–1.24)
GFR calc non Af Amer: >60 mL/min (ref >60)
Glucose, Bld: 89 mg/dL (ref 70–99)
Potassium: 3.1 mmol/L — ABNORMAL LOW (ref 3.5–5.1)
Sodium: 137 mmol/L (ref 135–145)
Total Bilirubin: 1 mg/dL (ref 0.3–1.2)
Total Protein: 6.4 g/dL — ABNORMAL LOW (ref 6.5–8.1)

## 2020-02-23 LAB — HEMOGLOBIN A1C
Hgb A1c MFr Bld: 5.5 % (ref 4.8–5.6)
Mean Plasma Glucose: 111.15 mg/dL

## 2020-02-23 LAB — CBC WITH DIFFERENTIAL/PLATELET
Abs Immature Granulocytes: 0 10*3/uL (ref 0.00–0.07)
Basophils Absolute: 0.3 10*3/uL — ABNORMAL HIGH (ref 0.0–0.1)
Basophils Relative: 1 %
Eosinophils Absolute: 1 10*3/uL — ABNORMAL HIGH (ref 0.0–0.5)
Eosinophils Relative: 4 %
HCT: 36 % — ABNORMAL LOW (ref 39.0–52.0)
Hemoglobin: 11.9 g/dL — ABNORMAL LOW (ref 13.0–17.0)
Lymphocytes Relative: 21 %
Lymphs Abs: 5.5 10*3/uL — ABNORMAL HIGH (ref 0.7–4.0)
MCH: 29 pg (ref 26.0–34.0)
MCHC: 33.1 g/dL (ref 30.0–36.0)
MCV: 87.6 fL (ref 80.0–100.0)
Monocytes Absolute: 3.1 10*3/uL — ABNORMAL HIGH (ref 0.1–1.0)
Monocytes Relative: 12 %
Neutro Abs: 16.2 10*3/uL — ABNORMAL HIGH (ref 1.7–7.7)
Neutrophils Relative %: 62 %
Platelets: 213 10*3/uL (ref 150–400)
RBC: 4.11 MIL/uL — ABNORMAL LOW (ref 4.22–5.81)
RDW: 15.2 % (ref 11.5–15.5)
WBC: 26.1 10*3/uL — ABNORMAL HIGH (ref 4.0–10.5)
nRBC: 0 % (ref 0.0–0.2)
nRBC: 0 /100 WBC

## 2020-02-23 MED ORDER — POTASSIUM CHLORIDE 20 MEQ PO PACK
40.0000 meq | PACK | Freq: Once | ORAL | Status: AC
  Administered 2020-02-23: 40 meq via ORAL
  Filled 2020-02-23: qty 2

## 2020-02-23 MED ORDER — METOPROLOL TARTRATE 12.5 MG HALF TABLET
12.5000 mg | ORAL_TABLET | Freq: Two times a day (BID) | ORAL | Status: DC
  Administered 2020-02-23 – 2020-03-27 (×67): 12.5 mg via ORAL
  Filled 2020-02-23 (×67): qty 1

## 2020-02-23 MED ORDER — FAMOTIDINE 20 MG PO TABS
20.0000 mg | ORAL_TABLET | Freq: Two times a day (BID) | ORAL | Status: DC
  Administered 2020-02-23 – 2020-03-05 (×23): 20 mg via ORAL
  Filled 2020-02-23 (×23): qty 1

## 2020-02-23 NOTE — Progress Notes (Signed)
Clinically stable. No new issues or problems.  Follow-up head CT scan with stable appearance of a small left occipital hemorrhage.  No new recommendations at this time. Patient may be mobilized ad lib. No further imaging required. Patient may resume his anticoagulation in 4 days. Please call me if any new problems.

## 2020-02-23 NOTE — Progress Notes (Addendum)
PHARMACIST - PHYSICIAN COMMUNICATION  DR:   Eliseo Squires  CONCERNING: IV to Oral Route Change Policy  RECOMMENDATION: This patient is receiving Famotidine by the intravenous route.  Based on criteria approved by the Pharmacy and Therapeutics Committee, the intravenous medication(s) is/are being converted to the equivalent oral dose form(s).   DESCRIPTION: These criteria include:  The patient is eating (either orally or via tube) and/or has been taking other orally administered medications for a least 24 hours  The patient has no evidence of active gastrointestinal bleeding or impaired GI absorption (gastrectomy, short bowel, patient on TNA or NPO).  If you have questions about this conversion, please contact the Pharmacy Department  []   [phone] )  [facility] []   [phone] )  [facility] [x]   [phone] )  [facility] []   [phone] )  [facility] []   [phone] )  [place], [place] 02/23/2020 11:14 AM   Addendum  Ok to give KCL 40 meq x1 per Dr. Iverson Alamin, PharmD, BCIDP, AAHIVP, CPP Infectious Disease Pharmacist 02/23/2020 11:32 AM

## 2020-02-23 NOTE — Progress Notes (Signed)
Patient inquired about his blood thing he stated that he had a blood clot in left after Covid vaccination. Explained that because of his brain bleed the medication is on hold and Md will reevaluate. He stated he understood.Arthor Captain LPN

## 2020-02-23 NOTE — Plan of Care (Signed)

## 2020-02-23 NOTE — Progress Notes (Signed)
Progress Note    David Oneill  ZOX:[id number] DOB: 27-Sep-1955  DOA: 02/21/2020 PCP: Patient, No Pcp Per    Brief Narrative:     Medical records reviewed and are as summarized below:  David Oneill is an 64 y.o. male with suicidal intent, presented after drug overdose with oxycodone and Restoril and was noted to have both a wrist lacerations.  CT head was noted with small occipital intraparenchymal hemorrhage with subarachnoid extension.  Assessment/Plan:   Active Problems:   SAH (subarachnoid hemorrhage) (HCC)   Non-aneurysmal small left occipital intraparenchymal hemorrhage with subarachnoid extension Acute toxic encephalopathy due to drug overdose with oxycodone and Restoril -Secondary to trauma in light of anti-coagulant use (Xarelto)  - Xarelto reversed with Kcentra in the ED - Neurosurgery consulted; appreciate their recommendations - Repeat head CT stable -mental status stable  Chronic atrial fibrillation -hold xarelto for 5 days -resume BB with holding parameters  Suicidal attempt with bilateral wrist laceration and drug overdose -psych consult -suicide precautions  HFrEF, not in acute exacerbation  Recent Massive PE, anticoagulation is on hold -per Neurosurgery can resume after 5 days.  obesity Body mass index is 32.05 kg/m.  Hypokalemia -replete   Family Communication/Anticipated D/C date and plan/Code Status   DVT prophylaxis: scd Code Status: Full Code.  Disposition Plan: Status is: Inpatient  Remains inpatient appropriate because:Unsafe d/c plan   Dispo: The patient is from: Home              Anticipated d/c is to: Home              Anticipated d/c date is: 2 days              Patient currently is not medically stable to d/c.         Medical Consultants:    PCCM  psych    Subjective:   In bed, sleeping-- will awaken. No complaints  Objective:    Vitals:   02/22/20 2017 02/22/20 2020 02/23/20 0535 02/23/20  1315  BP: (!) 91/50 (!) 145/72 134/69 126/71  Pulse: 71 79 63 96  Resp: 16  18 14   Temp: 99.5 F (37.5 C)  98.2 F (36.8 C) 98.1 F (36.7 C)  TempSrc: Oral Oral Oral Oral  SpO2: 98% 99% 98% 98%  Weight:   107.2 kg   Height:        Intake/Output Summary (Last 24 hours) at 02/23/2020 1330 Last data filed at 02/23/2020 1247 Gross per 24 hour  Intake 1800 ml  Output 2300 ml  Net -500 ml   Filed Weights   02/21/20 0614 02/21/20 1307 02/23/20 0535  Weight: 127 kg 106.1 kg 107.2 kg    Exam:  General: Appearance:    Obese male in no acute distress     Lungs:     respirations unlabored  Heart:    Normal heart rate. Normal rhythm. No murmurs, rubs, or gallops.   MS:   All extremities are intact.   Neurologic:   Awake, alert    Data Reviewed:   I have personally reviewed following labs and imaging studies:  Labs: Labs show the following:   Basic Metabolic Panel: Recent Labs  Lab 02/21/20 0617 02/21/20 0617 02/21/20 1144 02/21/20 1208 02/21/20 1208 02/22/20 0445 02/23/20 0545  NA 126*  --  133* 133*  --  135 137  K 3.3*   < >  --  2.9*   < > 3.5 3.1*  CL 93*  --   --   --   --  101 101  CO2 22  --   --   --   --  23 24  GLUCOSE 137*  --   --   --   --  126* 89  BUN 16  --   --   --   --  15 13  CREATININE 0.99  --   --   --   --  1.21 1.07  CALCIUM 8.3*  --   --   --   --  8.7* 8.8*  MG  --   --  2.0  --   --  2.0  --   PHOS  --   --  4.0  --   --  4.7*  --    < > = values in this interval not displayed.   GFR Estimated Creatinine Clearance: 87 mL/min (by C-G formula based on SCr of 1.07 mg/dL). Liver Function Tests: Recent Labs  Lab 02/21/20 0617 02/22/20 0445 02/23/20 0545  AST 29 42* 43*  ALT 15 23 24   ALKPHOS 56 58 60  BILITOT 1.6* 1.5* 1.0  PROT 5.8* 6.2* 6.4*  ALBUMIN 3.3* 3.5 3.7   No results for input(s): LIPASE, AMYLASE in the last 168 hours. No results for input(s): AMMONIA in the last 168 hours. Coagulation profile Recent Labs  Lab  02/21/20 0644 02/21/20 1144  INR 1.3* 1.0    CBC: Recent Labs  Lab 02/21/20 0617 02/21/20 1208 02/22/20 0445 02/23/20 0545  WBC 27.1*  --  24.0* 26.1*  NEUTROABS  --   --   --  16.2*  HGB 10.7* 11.2* 11.8* 11.9*  HCT 33.0* 33.0* 36.4* 36.0*  MCV 87.1  --  86.5 87.6  PLT 204  --  197 213   Cardiac Enzymes: No results for input(s): CKTOTAL, CKMB, CKMBINDEX, TROPONINI in the last 168 hours. BNP (last 3 results) No results for input(s): PROBNP in the last 8760 hours. CBG: Recent Labs  Lab 02/21/20 0739 02/21/20 2158  GLUCAP 138* 99   D-Dimer: No results for input(s): DDIMER in the last 72 hours. Hgb A1c: Recent Labs    02/23/20 0545  HGBA1C 5.5   Lipid Profile: No results for input(s): CHOL, HDL, LDLCALC, TRIG, CHOLHDL, LDLDIRECT in the last 72 hours. Thyroid function studies: No results for input(s): TSH, T4TOTAL, T3FREE, THYROIDAB in the last 72 hours.  Invalid input(s): FREET3 Anemia work up: No results for input(s): VITAMINB12, FOLATE, FERRITIN, TIBC, IRON, RETICCTPCT in the last 72 hours. Sepsis Labs: Recent Labs  Lab 02/21/20 0617 02/22/20 0445 02/23/20 0545  WBC 27.1* 24.0* 26.1*    Microbiology Recent Results (from the past 240 hour(s))  Respiratory Panel by RT PCR (Flu A&B, Covid) - Nasopharyngeal Swab     Status: None   Collection Time: 02/21/20  6:58 AM   Specimen: Nasopharyngeal Swab  Result Value Ref Range Status   SARS Coronavirus 2 by RT PCR NEGATIVE NEGATIVE Final    Comment: (NOTE) SARS-CoV-2 target nucleic acids are NOT DETECTED.  The SARS-CoV-2 RNA is generally detectable in upper respiratoy specimens during the acute phase of infection. The lowest concentration of SARS-CoV-2 viral copies this assay can detect is 131 copies/mL. A negative result does not preclude SARS-Cov-2 infection and should not be used as the sole basis for treatment or other patient management decisions. A negative result may occur with  improper specimen  collection/handling, submission of specimen other than nasopharyngeal swab, presence of viral mutation(s) within the areas targeted by this assay, and inadequate number of  viral copies (<131 copies/mL). A negative result must be combined with clinical observations, patient history, and epidemiological information. The expected result is Negative.  Fact Sheet for Patients:  PinkCheek.be  Fact Sheet for Healthcare Providers:  GravelBags.it  This test is no t yet approved or cleared by the [place] FDA and  has been authorized for detection and/or diagnosis of SARS-CoV-2 by FDA under an Emergency Use Authorization (EUA). This EUA will remain  in effect (meaning this test can be used) for the duration of the COVID-19 declaration under Section 564(b)(1) of the Act, 21 U.S.C. section 360bbb-3(b)(1), unless the authorization is terminated or revoked sooner.     Influenza A by PCR NEGATIVE NEGATIVE Final   Influenza B by PCR NEGATIVE NEGATIVE Final    Comment: (NOTE) The Xpert Xpress SARS-CoV-2/FLU/RSV assay is intended as an aid in  the diagnosis of influenza from Nasopharyngeal swab specimens and  should not be used as a sole basis for treatment. Nasal washings and  aspirates are unacceptable for Xpert Xpress SARS-CoV-2/FLU/RSV  testing.  Fact Sheet for Patients: PinkCheek.be  Fact Sheet for Healthcare Providers: GravelBags.it  This test is not yet approved or cleared by the [place] FDA and  has been authorized for detection and/or diagnosis of SARS-CoV-2 by  FDA under an Emergency Use Authorization (EUA). This EUA will remain  in effect (meaning this test can be used) for the duration of the  Covid-19 declaration under Section 564(b)(1) of the Act, 21  U.S.C. section 360bbb-3(b)(1), unless the authorization is  terminated or revoked. Performed at [facility], [place] [address] [place], [place] [address]   MRSA PCR Screening     Status: None   Collection Time: 02/21/20  1:04 PM   Specimen: Nasal Mucosa; Nasopharyngeal  Result Value Ref Range Status   MRSA by PCR NEGATIVE NEGATIVE Final    Comment:        The GeneXpert MRSA Assay (FDA approved for NASAL specimens only), is one component of a comprehensive MRSA colonization surveillance program. It is not intended to diagnose MRSA infection nor to guide or monitor treatment for MRSA infections. Performed at [facility], [place] [address], [place], [place] [address]     Procedures and diagnostic studies:  CT HEAD WO CONTRAST  Result Date: 02/22/2020 CLINICAL DATA:  Follow-up intracranial hemorrhage. EXAM: CT HEAD WITHOUT CONTRAST TECHNIQUE: Contiguous axial images were obtained from the base of the skull through the vertex without intravenous contrast. COMPARISON:  CT head 02/21/2020 FINDINGS: Brain: No substantial change in size/appearance of a superficial left occipital hematoma measuring approximatelyr 18 mm. Similar small volume of adjacent subarachnoid hemorrhage. Similar surrounding edema. No evidence of new hemorrhage. No midline shift. Basal cisterns are patent. No evidence of acute large vascular territory infarct. No hydrocephalus. Vascular: No hyperdense vessel or unexpected calcification. Calcific atherosclerosis. Skull: Normal. Negative for fracture or focal lesion. Sinuses/Orbits: Left maxillary sinus retention cyst. Frothy secretions in the right maxillary sinus and sphenoid sinuses, and scattered ethmoid air cells. IMPRESSION: Similar size/appearance of an 18 mm left occipital hematoma with similar small volume subarachnoid hemorrhage extension. No evidence of new hemorrhage. Electronically Signed   By: Margaretha Sheffield MD   On: 02/22/2020 11:11   DG Chest Port 1 View  Result Date: 02/22/2020 CLINICAL DATA:  Cardiomegaly. EXAM: PORTABLE CHEST 1 VIEW COMPARISON:   02/21/2020 FINDINGS: 0527 hours. The lungs are clear without focal pneumonia, edema, pneumothorax or pleural effusion. Cardiopericardial silhouette is at upper limits  of normal for size. Hazy opacity at the cardiac apex compatible with fat pad as seen on CT of 02/20/2020. IMPRESSION: No active disease. Electronically Signed   By: Misty Stanley M.D.   On: 02/22/2020 07:59    Medications:   . famotidine  20 mg Oral BID  . feeding supplement (ENSURE ENLIVE)  237 mL Oral TID BM  . mometasone-formoterol  2 puff Inhalation BID  . multivitamin with minerals  1 tablet Oral Daily  . potassium chloride  40 mEq Oral Once  . thiamine  100 mg Oral Daily   Continuous Infusions:   LOS: 2 days   Geradine Girt  [facility]   How to contact the [facility] Attending or Consulting provider [place] or covering provider during after hours [place], for this patient?  1. Check the care team in [facility] and look for a) attending/consulting [facility] provider listed and b) the [facility] team listed 2. Log into [email or web address] and use [place]'s universal password to access. If you do not have the password, please contact the hospital operator. 3. Locate the [facility] provider you are looking for under [facility] and page to a number that you can be directly reached. 4. If you still have difficulty reaching the provider, please page the [facility] (Director on Call) for the Hospitalists listed on amion for assistance.  02/23/2020, 1:30 PM

## 2020-02-23 NOTE — Consult Note (Addendum)
[facility] Consult   Reason for Consult:  Suicidal ideation Referring Physician:  Jacky Kindle, MD Patient Identification: David Oneill MRN:  [id number] Principal Diagnosis: Suicide attempt [facility]) Diagnosis:  Principal Problem:   Suicide attempt [facility]) Active Problems:   SAH (subarachnoid hemorrhage) ([place])   Total Time spent with patient: 45 minutes  Subjective:  David Oneill is a 64 y.o. male patient admitted after presented after drug overdose of oxycodone and Restoril, lacerations, bilateral wrist.  HPI:  David Oneill, 64 y.o., male patient seen face to face by this provider, consulted with Dr. Dwyane Dee; and chart reviewed on 02/23/20.  On evaluation David Oneill reports he doesn't know what happened prior to his hospital admission.  "I had been sick and my family was trying to get me to go to the hospital but I wanted to try and handle it myself.  I had some medication for nausea and I was just going to take that.  I remember waking up outside and crawling to my neighbors house; I believe I told them I had tried to kill myself or something.  But I don't remember."  Patient states that he has never attempted suicide in past and states that he is really not depressed.  "I have some family issues.  I moved here from [place] cause my daughter told me she would help; so I sold my property and gave the money to my daughter to build a house it's been over a year."  Patient continue to tell the story of how he may not be able to move back in with daughter, how the house doesn't have a 2 car garage and another garage for him to work on his old car collection eventually stating "My son in law said I was nothing but a bum and that he was tired of me." Patient denies that he has a reason to try and kill himself and denies prior suicide attempt.  Patient also states that he has never been admitted to a psychiatric hospital.  Patient also denies anxiety and depression.  Continues  to repeat he doesn't remember anything.  When asked about his support system patient goes off subject to talk about how he cared for his mother, she having a fall, which lead the family not trusting him and that he really doesn't have any support "was suppose to be my daughter that is the main reason I moved here but know don't know if I can go back there."  Patient denies illicit drug use; but tells a story of how he was once prescribed pain medication and stopped only to have a doctor prescribe it to him again.  Patient stating that he is ashamed of everything that has happened and wishes he would have went to the hospital when he was first asked by his family.  When asked if he was having suicidal thoughts he replied "No I have so much to live for, things I want to do in life; like work on my 45 camaro."   During evaluation David Oneill is sitting on side of bed eating his lunch.  He is alert/oriented x 4; calm/cooperative; and mood congruent with affect.  Patient is speaking in a clear tone at moderate volume, and normal pace; with good eye contact.  His thought process is coherent, circumstantial and at times irrelevant.  There is no indication that he is currently responding to internal/external stimuli or experiencing delusional thought content.  Patient denies suicidal/self-harm/homicidal ideation, psychosis, and paranoia, but also stating  that he reported to his neighbor that he tried to kill himself, now minimizes what has happened.  Patient has remained calm throughout assessment. Patient discharged from [facility] on 104/21 where he was treated for abdominal pain.  A phone call made to [facility] by patients' daughter to the Transitional Care Team on 02/21/20:  David Oneill, Pt identifiers verified David Oneill states that the pt is currently at [facility] because he slit his wrist last night and tried to commit suicide  She states that  after discharge he cannot live with her because he did that while her kids were present   David Oneill empathized with her and she states that she has been talking with the people from [facility] to see if he can live there   Past Psychiatric History: Denies prior psychiatric history  Risk to Self:  Yes Risk to Others:  NO Prior Inpatient Therapy:  Denies Prior Outpatient Therapy:  Denies  Past Medical History:  Past Medical History:  Diagnosis Date  . Chronic back pain   . Suicide attempt ([place]) 02/22/2020    Past Surgical History:  Procedure Laterality Date  . ABLATION OF DYSRHYTHMIC FOCUS     Family History: History reviewed. No pertinent family history. Family Psychiatric  History: Denies Social History:  Social History   Substance and Sexual Activity  Alcohol Use Not Currently     Social History   Substance and Sexual Activity  Drug Use Yes  . Types: Marijuana    Social History   Socioeconomic History  . Marital status: Single    Spouse name: Not on file  . Number of children: Not on file  . Years of education: Not on file  . Highest education level: Not on file  Occupational History  . Not on file  Tobacco Use  . Smoking status: Never Smoker  . Smokeless tobacco: Never Used  Vaping Use  . Vaping Use: Never used  Substance and Sexual Activity  . Alcohol use: Not Currently  . Drug use: Yes    Types: Marijuana  . Sexual activity: Not on file  Other Topics Concern  . Not on file  Social History Narrative  . Not on file   Social Determinants of Health   Financial Resource Strain:   . Difficulty of Paying Living Expenses: Not on file  Food Insecurity:   . Worried About Charity fundraiser in the Last Year: Not on file  . Ran Out of Food in the Last Year: Not on file  Transportation Needs:   . Lack of Transportation (Medical): Not on file  . Lack of Transportation (Non-Medical): Not on file  Physical Activity:   . Days of Exercise per Week: Not on  file  . Minutes of Exercise per Session: Not on file  Stress:   . Feeling of Stress : Not on file  Social Connections:   . Frequency of Communication with Friends and Family: Not on file  . Frequency of Social Gatherings with Friends and Family: Not on file  . Attends Religious Services: Not on file  . Active Member of Clubs or Organizations: Not on file  . Attends Archivist Meetings: Not on file  . Marital Status: Not on file   Additional Social History:    Allergies:  Not on File  Labs:  Results for orders placed or performed during the hospital encounter of 02/21/20 (from the past 48 hour(s))  Glucose, capillary     Status: None   Collection Time: 02/21/20  9:58 PM  Result Value Ref Range   Glucose-Capillary 99 70 - 99 mg/dL    Comment: Glucose reference range applies only to samples taken after fasting for at least 8 hours.  CBC     Status: Abnormal   Collection Time: 02/22/20  4:45 AM  Result Value Ref Range   WBC 24.0 (H) 4.0 - 10.5 K/uL   RBC 4.21 (L) 4.22 - 5.81 MIL/uL   Hemoglobin 11.8 (L) 13.0 - 17.0 g/dL   HCT 36.4 (L) 39 - 52 %   MCV 86.5 80.0 - 100.0 fL   MCH 28.0 26.0 - 34.0 pg   MCHC 32.4 30.0 - 36.0 g/dL   RDW 15.1 11.5 - 15.5 %   Platelets 197 150 - 400 K/uL   nRBC 0.0 0.0 - 0.2 %    Comment: Performed at [facility], [place] [address] [place], [place] [address]  Basic metabolic panel     Status: Abnormal   Collection Time: 02/22/20  4:45 AM  Result Value Ref Range   Sodium 135 135 - 145 mmol/L   Potassium 3.5 3.5 - 5.1 mmol/L   Chloride 101 98 - 111 mmol/L   CO2 23 22 - 32 mmol/L   Glucose, Bld 126 (H) 70 - 99 mg/dL    Comment: Glucose reference range applies only to samples taken after fasting for at least 8 hours.   BUN 15 8 - 23 mg/dL   Creatinine, Ser 1.21 0.61 - 1.24 mg/dL   Calcium 8.7 (L) 8.9 - 10.3 mg/dL   GFR calc non Af Amer >60 >60 mL/min   Anion gap 11 5 - 15    Comment: Performed at [facility] [address], [place], [place] [address]  Magnesium     Status: None   Collection Time: 02/22/20  4:45 AM  Result Value Ref Range   Magnesium 2.0 1.7 - 2.4 mg/dL    Comment: Performed at [facility] [address], [place], [place] [address]  Phosphorus     Status: Abnormal   Collection Time: 02/22/20  4:45 AM  Result Value Ref Range   Phosphorus 4.7 (H) 2.5 - 4.6 mg/dL    Comment: Performed at [facility] [address], [place], [place] [address]  Hepatic function panel     Status: Abnormal   Collection Time: 02/22/20  4:45 AM  Result Value Ref Range   Total Protein 6.2 (L) 6.5 - 8.1 g/dL   Albumin 3.5 3.5 - 5.0 g/dL   AST 42 (H) 15 - 41 U/L   ALT 23 0 - 44 U/L   Alkaline Phosphatase 58 38 - 126 U/L   Total Bilirubin 1.5 (H) 0.3 - 1.2 mg/dL   Bilirubin, Direct 0.3 (H) 0.0 - 0.2 mg/dL   Indirect Bilirubin 1.2 (H) 0.3 - 0.9 mg/dL    Comment: Performed at [facility] [address], [place], [place] [address]  Hemoglobin A1c     Status: None   Collection Time: 02/23/20  5:45 AM  Result Value Ref Range   Hgb A1c MFr Bld 5.5 4.8 - 5.6 %    Comment: (NOTE) Pre diabetes:          5.7%-6.4%  Diabetes:              >6.4%  Glycemic control for   <7.0% adults with diabetes  Mean Plasma Glucose 111.15 mg/dL    Comment: Performed at [facility] [address], [place], [place] [address]  Comprehensive metabolic panel     Status: Abnormal   Collection Time: 02/23/20  5:45 AM  Result Value Ref Range   Sodium 137 135 - 145 mmol/L   Potassium 3.1 (L) 3.5 - 5.1 mmol/L   Chloride 101 98 - 111 mmol/L   CO2 24 22 - 32 mmol/L   Glucose, Bld 89 70 - 99 mg/dL    Comment: Glucose reference range applies only to samples taken after fasting for at least 8 hours.   BUN 13 8 - 23 mg/dL   Creatinine, Ser 1.07 0.61 - 1.24 mg/dL   Calcium 8.8 (L) 8.9 - 10.3 mg/dL   Total Protein 6.4 (L) 6.5 - 8.1 g/dL   Albumin 3.7 3.5 - 5.0 g/dL   AST 43 (H) 15 - 41 U/L   ALT 24 0 - 44 U/L    Alkaline Phosphatase 60 38 - 126 U/L   Total Bilirubin 1.0 0.3 - 1.2 mg/dL   GFR calc non Af Amer >60 >60 mL/min   Anion gap 12 5 - 15    Comment: Performed at [facility], [place] [address] [place], [place] [address]  CBC with Differential/Platelet     Status: Abnormal   Collection Time: 02/23/20  5:45 AM  Result Value Ref Range   WBC 26.1 (H) 4.0 - 10.5 K/uL   RBC 4.11 (L) 4.22 - 5.81 MIL/uL   Hemoglobin 11.9 (L) 13.0 - 17.0 g/dL   HCT 36.0 (L) 39 - 52 %   MCV 87.6 80.0 - 100.0 fL   MCH 29.0 26.0 - 34.0 pg   MCHC 33.1 30.0 - 36.0 g/dL   RDW 15.2 11.5 - 15.5 %   Platelets 213 150 - 400 K/uL   nRBC 0.0 0.0 - 0.2 %   Neutrophils Relative % 62 %   Neutro Abs 16.2 (H) 1.7 - 7.7 K/uL   Lymphocytes Relative 21 %   Lymphs Abs 5.5 (H) 0.7 - 4.0 K/uL   Monocytes Relative 12 %   Monocytes Absolute 3.1 (H) 0.1 - 1.0 K/uL   Eosinophils Relative 4 %   Eosinophils Absolute 1.0 (H) 0 - 0 K/uL   Basophils Relative 1 %   Basophils Absolute 0.3 (H) 0 - 0 K/uL   nRBC 0 0 /100 WBC   Abs Immature Granulocytes 0.00 0.00 - 0.07 K/uL    Comment: Performed at [facility], [place] [address] [place], [place] [address]    Current Facility-Administered Medications  Medication Dose Route Frequency Provider Last Rate Last Admin  . acetaminophen (TYLENOL) tablet 650 mg  650 mg Oral Q6H PRN Jose Persia, MD   650 mg at 02/23/20 0945  . albuterol (PROVENTIL) (2.5 MG/3ML) 0.083% nebulizer solution 2.5 mg  2.5 mg Nebulization Q2H PRN Magdalen Spatz, NP      . docusate sodium (COLACE) capsule 100 mg  100 mg Oral BID PRN Magdalen Spatz, NP      . famotidine (PEPCID) tablet 20 mg  20 mg Oral BID Vann, Jessica U, DO      . feeding supplement (ENSURE ENLIVE) (ENSURE ENLIVE) liquid 237 mL  237 mL Oral TID BM Jacky Kindle, MD   237 mL at 02/23/20 1358  . metoprolol tartrate (LOPRESSOR) tablet 12.5 mg  12.5 mg Oral BID Eulogio Bear U, DO   12.5 mg at 02/23/20 1750  .  mometasone-formoterol (DULERA)  100-5 MCG/ACT inhaler 2 puff  2 puff Inhalation BID Jose Persia, MD   2 puff at 02/23/20 0758  . multivitamin with minerals tablet 1 tablet  1 tablet Oral Daily Jacky Kindle, MD   1 tablet at 02/23/20 0931  . naloxone [facility]) injection 0.4 mg  0.4 mg Intravenous PRN Simonne Maffucci B, MD   0.4 mg at 02/21/20 2202  . ondansetron (ZOFRAN) injection 4 mg  4 mg Intravenous Q6H PRN Magdalen Spatz, NP   4 mg at 02/22/20 2022  . polyethylene glycol (MIRALAX / GLYCOLAX) packet 17 g  17 g Oral Daily PRN Magdalen Spatz, NP      . thiamine tablet 100 mg  100 mg Oral Daily Jose Persia, MD   100 mg at 02/23/20 5170    Musculoskeletal: Strength & Muscle Tone: within normal limits Gait & Station: normal Patient leans: N/A  Psychiatric Specialty Exam: Physical Exam Vitals and nursing note reviewed. Exam conducted with a chaperone present.  HENT:     Head: Normocephalic.  Pulmonary:     Effort: Pulmonary effort is normal.  Musculoskeletal:        General: Normal range of motion.  Skin:    General: Skin is warm and dry.     Comments: Gauze bandage bilateral wrist  Neurological:     General: No focal deficit present.     Mental Status: He is alert and oriented to person, place, and time.  Psychiatric:        Attention and Perception: Attention and perception normal. He does not perceive auditory or visual hallucinations.        Mood and Affect: Mood and affect normal.        Speech: Speech is tangential.        Behavior: Behavior normal. Behavior is cooperative.        Thought Content: Thought content is delusional. Thought content is not paranoid. Thought content does not include homicidal or suicidal ideation.        Cognition and Memory: Cognition and memory normal.        Judgment: Judgment is impulsive.     Review of Systems  Gastrointestinal: Positive for abdominal pain.  Musculoskeletal: Positive for arthralgias.  Skin:       Laceration covered with gauze   Psychiatric/Behavioral: Confusion: Denies. Hallucinations: Denies. Self-injury: Denies. Suicidal ideas: Denies. Nervous/anxious: Denies.        Reports he has cuts on each of his wrist but doesn't remember how they got there and that he has no reason to try to kill himself  All other systems reviewed and are negative.   Blood pressure 134/75, pulse 81, temperature 98.1 F (36.7 C), temperature source Oral, resp. rate 18, height 6' (1.829 m), weight 107.2 kg, SpO2 100 %.Body mass index is 32.05 kg/m.  General Appearance: Casual  Eye Contact:  Good  Speech:  Clear and Coherent and Normal Rate  Volume:  Normal  Mood:  Anxious  Affect:  Appropriate  Thought Process:  Coherent and Descriptions of Associations: Circumstantial  Orientation:  Full (Time, Place, and Person)  Thought Content:  WDL and Rumination  Suicidal Thoughts:  Suicide attempt prior to admission.  Currently denies suicidal ideation, and denies that he has a reason to attempt suicide  Homicidal Thoughts:  No  Memory:  Immediate;   Good Recent;   Fair  Judgement:  Fair  Insight:  Fair and Present  Psychomotor Activity:  Normal  Concentration:  Concentration:  Good and Attention Span: Good  Recall:  [place] of Knowledge:  Good  Language:  Good  Akathisia:  No  Handed:  Right  AIMS (if indicated):     Assets:  Communication Skills Desire for Improvement Housing  ADL's:  Intact  Cognition:  WNL  Sleep:      It is apparent that patient did try to kill himself and is currently minimizing situation.   Treatment Plan Summary: Plan Inpatient psychiatric treatment  Disposition: Recommend psychiatric Inpatient admission when medically cleared.   Will order a social work consult to work with patient once he has been medically cleared to organize psychiatric hospitalization.     Rmoni Keplinger, NP 02/23/2020 6:18 PM

## 2020-02-24 DIAGNOSIS — C9110 Chronic lymphocytic leukemia of B-cell type not having achieved remission: Secondary | ICD-10-CM | POA: Diagnosis present

## 2020-02-24 DIAGNOSIS — I629 Nontraumatic intracranial hemorrhage, unspecified: Secondary | ICD-10-CM | POA: Diagnosis not present

## 2020-02-24 DIAGNOSIS — T50902A Poisoning by unspecified drugs, medicaments and biological substances, intentional self-harm, initial encounter: Secondary | ICD-10-CM | POA: Diagnosis not present

## 2020-02-24 DIAGNOSIS — T1491XA Suicide attempt, initial encounter: Secondary | ICD-10-CM | POA: Diagnosis not present

## 2020-02-24 LAB — BASIC METABOLIC PANEL
Anion gap: 10 (ref 5–15)
BUN: 13 mg/dL (ref 8–23)
CO2: 25 mmol/L (ref 22–32)
Calcium: 9.2 mg/dL (ref 8.9–10.3)
Chloride: 101 mmol/L (ref 98–111)
Creatinine, Ser: 1.07 mg/dL (ref 0.61–1.24)
GFR calc non Af Amer: >60 mL/min (ref >60)
Glucose, Bld: 105 mg/dL — ABNORMAL HIGH (ref 70–99)
Potassium: 3.3 mmol/L — ABNORMAL LOW (ref 3.5–5.1)
Sodium: 136 mmol/L (ref 135–145)

## 2020-02-24 LAB — CBC WITH DIFFERENTIAL/PLATELET
Abs Immature Granulocytes: 0.12 10*3/uL — ABNORMAL HIGH (ref 0.00–0.07)
Basophils Absolute: 0.1 10*3/uL (ref 0.0–0.1)
Basophils Relative: 0 %
Eosinophils Absolute: 0.4 10*3/uL (ref 0.0–0.5)
Eosinophils Relative: 2 %
HCT: 35.2 % — ABNORMAL LOW (ref 39.0–52.0)
Hemoglobin: 11.4 g/dL — ABNORMAL LOW (ref 13.0–17.0)
Immature Granulocytes: 0 %
Lymphocytes Relative: 40 %
Lymphs Abs: 10.7 10*3/uL — ABNORMAL HIGH (ref 0.7–4.0)
MCH: 28.8 pg (ref 26.0–34.0)
MCHC: 32.4 g/dL (ref 30.0–36.0)
MCV: 88.9 fL (ref 80.0–100.0)
Monocytes Absolute: 4.7 10*3/uL — ABNORMAL HIGH (ref 0.1–1.0)
Monocytes Relative: 18 %
Neutro Abs: 10.7 10*3/uL — ABNORMAL HIGH (ref 1.7–7.7)
Neutrophils Relative %: 40 %
Platelets: 226 10*3/uL (ref 150–400)
RBC: 3.96 MIL/uL — ABNORMAL LOW (ref 4.22–5.81)
RDW: 15.6 % — ABNORMAL HIGH (ref 11.5–15.5)
WBC: 26.7 10*3/uL — ABNORMAL HIGH (ref 4.0–10.5)
nRBC: 0 % (ref 0.0–0.2)

## 2020-02-24 MED ORDER — VITAMIN B-12 1000 MCG PO TABS
1000.0000 ug | ORAL_TABLET | Freq: Every day | ORAL | Status: DC
  Administered 2020-02-24 – 2020-03-27 (×33): 1000 ug via ORAL
  Filled 2020-02-24 (×33): qty 1

## 2020-02-24 MED ORDER — FLUTICASONE PROPIONATE 50 MCG/ACT NA SUSP
2.0000 | Freq: Every day | NASAL | Status: DC | PRN
  Administered 2020-02-25 – 2020-03-26 (×16): 2 via NASAL
  Filled 2020-02-24: qty 16

## 2020-02-24 MED ORDER — POTASSIUM CHLORIDE CRYS ER 20 MEQ PO TBCR
40.0000 meq | EXTENDED_RELEASE_TABLET | ORAL | Status: AC
  Administered 2020-02-24 (×2): 40 meq via ORAL
  Filled 2020-02-24 (×2): qty 2

## 2020-02-24 MED ORDER — PANTOPRAZOLE SODIUM 40 MG PO TBEC
40.0000 mg | DELAYED_RELEASE_TABLET | Freq: Every day | ORAL | Status: DC
  Administered 2020-02-24: 40 mg via ORAL
  Filled 2020-02-24: qty 1

## 2020-02-24 MED ORDER — MELATONIN 3 MG PO TABS
3.0000 mg | ORAL_TABLET | Freq: Every day | ORAL | Status: DC
  Administered 2020-02-24 – 2020-03-02 (×8): 3 mg via ORAL
  Filled 2020-02-24 (×8): qty 1

## 2020-02-24 NOTE — Progress Notes (Signed)
Progress Note    David Oneill  HBZ:[id number] DOB: [birth year]  DOA: 02/21/2020 PCP: Patient, No Pcp Per    Brief Narrative:     Medical records reviewed and are as summarized below:  David Oneill is an 64 y.o. male with suicidal intent, presented after drug overdose with oxycodone and Restoril and was noted to have both a wrist lacerations.  CT head was noted with small occipital intraparenchymal hemorrhage with subarachnoid extension.  Awaiting geri-psych placement  Assessment/Plan:   Active Problems:   SAH (subarachnoid hemorrhage) (HCC)   Suicide attempt ([place])   Intentional drug overdose ([place])   CLL (chronic lymphocytic leukemia) (HCC)   Non-aneurysmal small left occipital intraparenchymal hemorrhage with subarachnoid extension Acute toxic encephalopathy due to drug overdose with oxycodone and Restoril -Secondary to trauma in light of anti-coagulant use (Xarelto)  - Xarelto reversed with Kcentra in the ED - Neurosurgery consulted; appreciate their recommendations- resume xarelto on Monday - Repeat head CT stable -mental status stable  Chronic atrial fibrillation -can resume xraelto Monday -resume BB with holding parameters  Suicidal attempt with bilateral wrist laceration and drug overdose -psych consult- needs geri-psych placement -suicide precautions -medically stable for D/C  HFrEF, not in acute exacerbation  Recent Massive PE, anticoagulation is on hold -per Neurosurgery can resume on Monday  CLL -WBCs stable  obesity Body mass index is 31.26 kg/m.  Hypokalemia -replete   Family Communication/Anticipated D/C date and plan/Code Status   DVT prophylaxis: scd Code Status: Full Code.  Disposition Plan: Status is: Inpatient  Remains inpatient appropriate because:Unsafe d/c plan   Dispo: The patient is from: Home              Anticipated d/c is to: Home              Anticipated d/c date is: 2 days              Patient currently is  medically stable to inpatient psych         Medical Consultants:    PCCM  psych    Subjective:   Multiple complaints, difficult to keep on conversation  Objective:    Vitals:   02/24/20 0441 02/24/20 0728 02/24/20 1020 02/24/20 1216  BP: 125/71  (!) 114/51 123/67  Pulse: 76  91 82  Resp:    18  Temp: 98.9 F (37.2 C)  98.4 F (36.9 C) 98.5 F (36.9 C)  TempSrc: Oral  Oral Oral  SpO2: 100% 100% 98% 97%  Weight: 104.6 kg     Height:        Intake/Output Summary (Last 24 hours) at 02/24/2020 1506 Last data filed at 02/24/2020 1229 Gross per 24 hour  Intake 980 ml  Output 2200 ml  Net -1220 ml   Filed Weights   02/21/20 1307 02/23/20 0535 02/24/20 0441  Weight: 106.1 kg 107.2 kg 104.6 kg    Exam:  General: Appearance:    Obese male in no acute distress, rambles, no eye contact     Lungs:     Clear to auscultation bilaterally, respirations unlabored  Heart:    Normal heart rate. Normal rhythm. No murmurs, rubs, or gallops.   MS:   All extremities are intact.   Neurologic:   Awake, alert, oriented x 3. No apparent focal neurological           defect.      Data Reviewed:   I have personally reviewed following labs and imaging studies:  Labs: Labs  show the following:   Basic Metabolic Panel: Recent Labs  Lab 02/21/20 0617 02/21/20 0617 02/21/20 1144 02/21/20 1208 02/21/20 1208 02/22/20 0445 02/22/20 0445 02/23/20 0545 02/24/20 0319  NA 126*   < > 133* 133*  --  135  --  137 136  K 3.3*   < >  --  2.9*   < > 3.5   < > 3.1* 3.3*  CL 93*  --   --   --   --  101  --  101 101  CO2 22  --   --   --   --  23  --  24 25  GLUCOSE 137*  --   --   --   --  126*  --  89 105*  BUN 16  --   --   --   --  15  --  13 13  CREATININE 0.99  --   --   --   --  1.21  --  1.07 1.07  CALCIUM 8.3*  --   --   --   --  8.7*  --  8.8* 9.2  MG  --   --  2.0  --   --  2.0  --   --   --   PHOS  --   --  4.0  --   --  4.7*  --   --   --    < > = values in this  interval not displayed.   GFR Estimated Creatinine Clearance: 86.1 mL/min (by C-G formula based on SCr of 1.07 mg/dL). Liver Function Tests: Recent Labs  Lab 02/21/20 0617 02/22/20 0445 02/23/20 0545  AST 29 42* 43*  ALT 15 23 24   ALKPHOS 56 58 60  BILITOT 1.6* 1.5* 1.0  PROT 5.8* 6.2* 6.4*  ALBUMIN 3.3* 3.5 3.7   No results for input(s): LIPASE, AMYLASE in the last 168 hours. No results for input(s): AMMONIA in the last 168 hours. Coagulation profile Recent Labs  Lab 02/21/20 0644 02/21/20 1144  INR 1.3* 1.0    CBC: Recent Labs  Lab 02/21/20 0617 02/21/20 1208 02/22/20 0445 02/23/20 0545 02/24/20 0319  WBC 27.1*  --  24.0* 26.1* 26.7*  NEUTROABS  --   --   --  16.2* 10.7*  HGB 10.7* 11.2* 11.8* 11.9* 11.4*  HCT 33.0* 33.0* 36.4* 36.0* 35.2*  MCV 87.1  --  86.5 87.6 88.9  PLT 204  --  197 213 226   Cardiac Enzymes: No results for input(s): CKTOTAL, CKMB, CKMBINDEX, TROPONINI in the last 168 hours. BNP (last 3 results) No results for input(s): PROBNP in the last 8760 hours. CBG: Recent Labs  Lab 02/21/20 0739 02/21/20 2158  GLUCAP 138* 99   D-Dimer: No results for input(s): DDIMER in the last 72 hours. Hgb A1c: Recent Labs    02/23/20 0545  HGBA1C 5.5   Lipid Profile: No results for input(s): CHOL, HDL, LDLCALC, TRIG, CHOLHDL, LDLDIRECT in the last 72 hours. Thyroid function studies: No results for input(s): TSH, T4TOTAL, T3FREE, THYROIDAB in the last 72 hours.  Invalid input(s): FREET3 Anemia work up: No results for input(s): VITAMINB12, FOLATE, FERRITIN, TIBC, IRON, RETICCTPCT in the last 72 hours. Sepsis Labs: Recent Labs  Lab 02/21/20 0617 02/22/20 0445 02/23/20 0545 02/24/20 0319  WBC 27.1* 24.0* 26.1* 26.7*    Microbiology Recent Results (from the past 240 hour(s))  Respiratory Panel by RT PCR (Flu A&B, Covid) - Nasopharyngeal Swab  Status: None   Collection Time: 02/21/20  6:58 AM   Specimen: Nasopharyngeal Swab  Result  Value Ref Range Status   SARS Coronavirus 2 by RT PCR NEGATIVE NEGATIVE Final    Comment: (NOTE) SARS-CoV-2 target nucleic acids are NOT DETECTED.  The SARS-CoV-2 RNA is generally detectable in upper respiratoy specimens during the acute phase of infection. The lowest concentration of SARS-CoV-2 viral copies this assay can detect is 131 copies/mL. A negative result does not preclude SARS-Cov-2 infection and should not be used as the sole basis for treatment or other patient management decisions. A negative result may occur with  improper specimen collection/handling, submission of specimen other than nasopharyngeal swab, presence of viral mutation(s) within the areas targeted by this assay, and inadequate number of viral copies (<131 copies/mL). A negative result must be combined with clinical observations, patient history, and epidemiological information. The expected result is Negative.  Fact Sheet for Patients:  PinkCheek.be  Fact Sheet for Healthcare Providers:  GravelBags.it  This test is no t yet approved or cleared by the [place] FDA and  has been authorized for detection and/or diagnosis of SARS-CoV-2 by FDA under an Emergency Use Authorization (EUA). This EUA will remain  in effect (meaning this test can be used) for the duration of the COVID-19 declaration under Section 564(b)(1) of the Act, 21 U.S.C. section 360bbb-3(b)(1), unless the authorization is terminated or revoked sooner.     Influenza A by PCR NEGATIVE NEGATIVE Final   Influenza B by PCR NEGATIVE NEGATIVE Final    Comment: (NOTE) The Xpert Xpress SARS-CoV-2/FLU/RSV assay is intended as an aid in  the diagnosis of influenza from Nasopharyngeal swab specimens and  should not be used as a sole basis for treatment. Nasal washings and  aspirates are unacceptable for Xpert Xpress SARS-CoV-2/FLU/RSV  testing.  Fact Sheet for  Patients: PinkCheek.be  Fact Sheet for Healthcare Providers: GravelBags.it  This test is not yet approved or cleared by the [place] FDA and  has been authorized for detection and/or diagnosis of SARS-CoV-2 by  FDA under an Emergency Use Authorization (EUA). This EUA will remain  in effect (meaning this test can be used) for the duration of the  Covid-19 declaration under Section 564(b)(1) of the Act, 21  U.S.C. section 360bbb-3(b)(1), unless the authorization is  terminated or revoked. Performed at [facility], [place] [address] [place], [place] [address]   MRSA PCR Screening     Status: None   Collection Time: 02/21/20  1:04 PM   Specimen: Nasal Mucosa; Nasopharyngeal  Result Value Ref Range Status   MRSA by PCR NEGATIVE NEGATIVE Final    Comment:        The GeneXpert MRSA Assay (FDA approved for NASAL specimens only), is one component of a comprehensive MRSA colonization surveillance program. It is not intended to diagnose MRSA infection nor to guide or monitor treatment for MRSA infections. Performed at [facility], [place] [address], [place], [place] [address]     Procedures and diagnostic studies:  No results found.  Medications:   . famotidine  20 mg Oral BID  . feeding supplement (ENSURE ENLIVE)  237 mL Oral TID BM  . melatonin  3 mg Oral QHS  . metoprolol tartrate  12.5 mg Oral BID  . mometasone-formoterol  2 puff Inhalation BID  . multivitamin with minerals  1 tablet Oral Daily  . pantoprazole  40 mg Oral Daily  . potassium chloride  40 mEq Oral Q4H  while awake  . thiamine  100 mg Oral Daily  . cyanocobalamin  1,000 mcg Oral Daily   Continuous Infusions:   LOS: 3 days   Geradine Girt  [facility]   How to contact the [facility] Attending or Consulting provider [place] or covering provider during after hours [place], for this patient?  1. Check the care team in [facility] and look for  a) attending/consulting TR[facility] provider listed and b) the [facility] team listed 2. Log into [email or web address] and use [place] universal password to access. If you do not have the password, please contact the hospital operator. 3. Locate the [facility] provider you are looking for under [facility] and page to a number that you can be directly reached. 4. If you still have difficulty reaching the provider, please page the [facility] (Director on Call) for the Hospitalists listed on amion for assistance.  02/24/2020, 3:06 PM

## 2020-02-24 NOTE — Progress Notes (Addendum)
CSW spoke with pt at bedside at length about why he was in the hospital. Pt stated he would like the opportunity to speak with his daughter. CSW discussed with pt the need for him to go to inpatient psych, pt agreed but was focused on speaking with his daughter and asking for forgiveness. CSW advised pt that she would try to contact daughter and arrange a phone call but couldn't make any promises. Pt stated he is having an issue with nausea, CSW Agricultural engineer. Pt was very tearful and cried a few times during the conversation, stating he missed his daughter a great deal. CSW spoke with pt's daughter Vito Backers by phone ([phone]), she stated that the pt can't return to her home and needs to go to SNF/IL/ALF afterwards. CSW provided information on how she could do that. CSW updated Cassandra on how pt agreed to go to inpatient psych. CSW advised Cassandra that paperwork had already been sent to [facility]. Cassandra said that was fine, she stated she would be available to sign paperwork when needed. Cassandra also stated that pt could call her by phone, RN made aware. CSW faxed clinicals over to [place] at [place] stated she would have them reviewed and reach out to weekend CSW with a response. CSW will continue to follow.

## 2020-02-24 NOTE — Evaluation (Addendum)
Physical Therapy Evaluation and D/C Patient Details Name: David Oneill MRN: [id number] DOB: 1956/02/27 Today's Date: 02/24/2020   History of Present Illness  Drevion Offord is a 64 y.o. male patient admitted after presented after drug overdose of oxycodone and Restoril, lacerations, bilateral wrist.  Clinical Impression  Pt admitted with above diagnosis. Pt Independent and has no mobility issues at this time.  Can be up with nursing on unit.  Agree with A or I  living after pcychiatric admission if pt will agree.   No further skilled PT needs at this time.     Follow Up Recommendations No PT follow up    Equipment Recommendations  None recommended by PT    Recommendations for Other Services       Precautions / Restrictions Precautions Precautions: None Restrictions Weight Bearing Restrictions: No      Mobility  Bed Mobility Overal bed mobility: Independent                Transfers Overall transfer level: Independent                  Ambulation/Gait Ambulation/Gait assistance: Independent Gait Distance (Feet): 450 Feet Assistive device: None Gait Pattern/deviations: WFL(Within Functional Limits)   Gait velocity interpretation: >2.62 ft/sec, indicative of community ambulatory General Gait Details: No LOB with challenges.  Pt DOE 2/4 but overall pt tolerated walk well.   Stairs            Wheelchair Mobility    Modified Rankin (Stroke Patients Only)       Balance Overall balance assessment: Independent                               Standardized Balance Assessment Standardized Balance Assessment : Dynamic Gait Index   Dynamic Gait Index Level Surface: Normal Change in Gait Speed: Normal Gait with Horizontal Head Turns: Normal Gait with Vertical Head Turns: Normal Gait and Pivot Turn: Normal Step Over Obstacle: Normal Step Around Obstacles: Normal Steps: Normal Total Score: 24       Pertinent Vitals/Pain Pain  Assessment: No/denies pain    Home Living Family/patient expects to be discharged to:: Unsure                      Prior Function Level of Independence: Independent               Hand Dominance   Dominant Hand: Right    Extremity/Trunk Assessment   Upper Extremity Assessment Upper Extremity Assessment: Defer to OT evaluation    Lower Extremity Assessment Lower Extremity Assessment: Overall WFL for tasks assessed    Cervical / Trunk Assessment Cervical / Trunk Assessment: Normal  Communication   Communication: No difficulties  Cognition Arousal/Alertness: Awake/alert Behavior During Therapy: WFL for tasks assessed/performed Overall Cognitive Status: History of cognitive impairments - at baseline                                        General Comments      Exercises     Assessment/Plan    PT Assessment Patent does not need any further PT services  PT Problem List         PT Treatment Interventions      PT Goals (Current goals can be found in the Care Plan section)  Acute Rehab PT  Goals Patient Stated Goal: to go home PT Goal Formulation: All assessment and education complete, DC therapy    Frequency     Barriers to discharge        Co-evaluation               AM-PAC PT "6 Clicks" Mobility  Outcome Measure Help needed turning from your back to your side while in a flat bed without using bedrails?: None Help needed moving from lying on your back to sitting on the side of a flat bed without using bedrails?: None Help needed moving to and from a bed to a chair (including a wheelchair)?: None Help needed standing up from a chair using your arms (e.g., wheelchair or bedside chair)?: None Help needed to walk in hospital room?: None Help needed climbing 3-5 steps with a railing? : None 6 Click Score: 24    End of Session Equipment Utilized During Treatment: Gait belt Activity Tolerance: Patient tolerated treatment  well Patient left: in chair;with call bell/phone within reach;with nursing/sitter in room Nurse Communication: Mobility status PT Visit Diagnosis: Muscle weakness (generalized) (M62.81)    Time: 9234-1443 PT Time Calculation (min) (ACUTE ONLY): 10 min   Charges:   PT Evaluation $PT Eval Low Complexity: 1 Low          Brahm Barbeau W,PT Acute Rehabilitation Services Pager:  [phone]  Office:  [place] 02/24/2020, 3:16 PM

## 2020-02-25 DIAGNOSIS — T1491XA Suicide attempt, initial encounter: Secondary | ICD-10-CM | POA: Diagnosis not present

## 2020-02-25 DIAGNOSIS — I629 Nontraumatic intracranial hemorrhage, unspecified: Secondary | ICD-10-CM | POA: Diagnosis not present

## 2020-02-25 DIAGNOSIS — I609 Nontraumatic subarachnoid hemorrhage, unspecified: Secondary | ICD-10-CM | POA: Diagnosis not present

## 2020-02-25 DIAGNOSIS — C9110 Chronic lymphocytic leukemia of B-cell type not having achieved remission: Secondary | ICD-10-CM | POA: Diagnosis not present

## 2020-02-25 MED ORDER — METOCLOPRAMIDE HCL 5 MG/ML IJ SOLN
5.0000 mg | Freq: Four times a day (QID) | INTRAMUSCULAR | Status: DC | PRN
  Administered 2020-02-25: 5 mg via INTRAVENOUS
  Filled 2020-02-25: qty 2

## 2020-02-25 MED ORDER — PANTOPRAZOLE SODIUM 40 MG PO TBEC
40.0000 mg | DELAYED_RELEASE_TABLET | Freq: Two times a day (BID) | ORAL | Status: DC
  Administered 2020-02-25 – 2020-03-20 (×50): 40 mg via ORAL
  Filled 2020-02-25 (×50): qty 1

## 2020-02-25 MED ORDER — TRAZODONE HCL 100 MG PO TABS
100.0000 mg | ORAL_TABLET | Freq: Every day | ORAL | Status: DC
  Administered 2020-02-25 – 2020-02-27 (×3): 100 mg via ORAL
  Filled 2020-02-25 (×3): qty 1

## 2020-02-25 NOTE — Progress Notes (Signed)
Progress Note    David Oneill  HER:[id number] DOB: 1955-10-07  DOA: 02/21/2020 PCP: Patient, No Pcp Per    Brief Narrative:     Medical records reviewed and are as summarized below:  David Oneill is an 64 y.o. male with suicidal intent, presented after drug overdose with oxycodone and Restoril and was noted to have both a wrist lacerations.  CT head was noted with small occipital intraparenchymal hemorrhage with subarachnoid extension.  Awaiting geri-psych placement.   Assessment/Plan:   Active Problems:   SAH (subarachnoid hemorrhage) (HCC)   Suicide attempt ([place])   Intentional drug overdose ([place])   CLL (chronic lymphocytic leukemia) (HCC)   Non-aneurysmal small left occipital intraparenchymal hemorrhage with subarachnoid extension Acute toxic encephalopathy due to drug overdose with oxycodone and Restoril -Secondary to trauma in light of anti-coagulant use (Xarelto)  - Xarelto reversed with Kcentra in the ED - Neurosurgery consulted; appreciate their recommendations- resume xarelto on Monday - Repeat head CT stable -mental status stable  Chronic atrial fibrillation -can resume xraelto Monday -resume BB with holding parameters  Suicidal attempt with bilateral wrist laceration and drug overdose -psych consult- needs geri-psych placement -suicide precautions -medically stable for D/C  HFrEF, not in acute exacerbation  Recent Massive PE, anticoagulation is on hold -per Neurosurgery can resume on Monday  CLL -WBCs stable  obesity Body mass index is 31.32 kg/m.  Hypokalemia -replete  Insomnia -trazodone   Family Communication/Anticipated D/C date and plan/Code Status   DVT prophylaxis: scd Code Status: Full Code.  Disposition Plan: Status is: Inpatient  Remains inpatient appropriate because:Unsafe d/c plan   Dispo: The patient is from: Home              Anticipated d/c is to: inpatient psych              Anticipated d/c date is: when bed  available              Patient currently is medically stable to inpatient psych         Medical Consultants:    PCCM  psych    Subjective:   Has been walking in hallways which he likes  Objective:    Vitals:   02/24/20 2119 02/25/20 0420 02/25/20 1022 02/25/20 1134  BP: 136/70 133/80 (!) 162/86 (!) 120/57  Pulse: 88 84 100 94  Resp:  20 18 18   Temp:  98.9 F (37.2 C) 98.7 F (37.1 C) 98.5 F (36.9 C)  TempSrc:  Oral Oral   SpO2: 99% 98% 99% 95%  Weight:  104.7 kg    Height:        Intake/Output Summary (Last 24 hours) at 02/25/2020 1321 Last data filed at 02/25/2020 1209 Gross per 24 hour  Intake 1277 ml  Output 4125 ml  Net -2848 ml   Filed Weights   02/23/20 0535 02/24/20 0441 02/25/20 0420  Weight: 107.2 kg 104.6 kg 104.7 kg    Exam:   General: Appearance:    Obese male in no acute distress     Lungs:     respirations unlabored  Heart:    Normal heart rate. Normal rhythm. No murmurs, rubs, or gallops.   MS:   All extremities are intact.   Neurologic:   Awake, alert    Data Reviewed:   I have personally reviewed following labs and imaging studies:  Labs: Labs show the following:   Basic Metabolic Panel: Recent Labs  Lab 02/21/20 0617 02/21/20 0617 02/21/20 1144 02/21/20  1208 02/21/20 1208 02/22/20 0445 02/22/20 0445 02/23/20 0545 02/24/20 0319  NA 126*   < > 133* 133*  --  135  --  137 136  K 3.3*   < >  --  2.9*   < > 3.5   < > 3.1* 3.3*  CL 93*  --   --   --   --  101  --  101 101  CO2 22  --   --   --   --  23  --  24 25  GLUCOSE 137*  --   --   --   --  126*  --  89 105*  BUN 16  --   --   --   --  15  --  13 13  CREATININE 0.99  --   --   --   --  1.21  --  1.07 1.07  CALCIUM 8.3*  --   --   --   --  8.7*  --  8.8* 9.2  MG  --   --  2.0  --   --  2.0  --   --   --   PHOS  --   --  4.0  --   --  4.7*  --   --   --    < > = values in this interval not displayed.   GFR Estimated Creatinine Clearance: 86.1 mL/min (by C-G  formula based on SCr of 1.07 mg/dL). Liver Function Tests: Recent Labs  Lab 02/21/20 0617 02/22/20 0445 02/23/20 0545  AST 29 42* 43*  ALT 15 23 24   ALKPHOS 56 58 60  BILITOT 1.6* 1.5* 1.0  PROT 5.8* 6.2* 6.4*  ALBUMIN 3.3* 3.5 3.7   No results for input(s): LIPASE, AMYLASE in the last 168 hours. No results for input(s): AMMONIA in the last 168 hours. Coagulation profile Recent Labs  Lab 02/21/20 0644 02/21/20 1144  INR 1.3* 1.0    CBC: Recent Labs  Lab 02/21/20 0617 02/21/20 1208 02/22/20 0445 02/23/20 0545 02/24/20 0319  WBC 27.1*  --  24.0* 26.1* 26.7*  NEUTROABS  --   --   --  16.2* 10.7*  HGB 10.7* 11.2* 11.8* 11.9* 11.4*  HCT 33.0* 33.0* 36.4* 36.0* 35.2*  MCV 87.1  --  86.5 87.6 88.9  PLT 204  --  197 213 226   Cardiac Enzymes: No results for input(s): CKTOTAL, CKMB, CKMBINDEX, TROPONINI in the last 168 hours. BNP (last 3 results) No results for input(s): PROBNP in the last 8760 hours. CBG: Recent Labs  Lab 02/21/20 0739 02/21/20 2158  GLUCAP 138* 99   D-Dimer: No results for input(s): DDIMER in the last 72 hours. Hgb A1c: Recent Labs    02/23/20 0545  HGBA1C 5.5   Lipid Profile: No results for input(s): CHOL, HDL, LDLCALC, TRIG, CHOLHDL, LDLDIRECT in the last 72 hours. Thyroid function studies: No results for input(s): TSH, T4TOTAL, T3FREE, THYROIDAB in the last 72 hours.  Invalid input(s): FREET3 Anemia work up: No results for input(s): VITAMINB12, FOLATE, FERRITIN, TIBC, IRON, RETICCTPCT in the last 72 hours. Sepsis Labs: Recent Labs  Lab 02/21/20 0617 02/22/20 0445 02/23/20 0545 02/24/20 0319  WBC 27.1* 24.0* 26.1* 26.7*    Microbiology Recent Results (from the past 240 hour(s))  Respiratory Panel by RT PCR (Flu A&B, Covid) - Nasopharyngeal Swab     Status: None   Collection Time: 02/21/20  6:58 AM   Specimen: Nasopharyngeal Swab  Result Value Ref  Range Status   SARS Coronavirus 2 by RT PCR NEGATIVE NEGATIVE Final     Comment: (NOTE) SARS-CoV-2 target nucleic acids are NOT DETECTED.  The SARS-CoV-2 RNA is generally detectable in upper respiratoy specimens during the acute phase of infection. The lowest concentration of SARS-CoV-2 viral copies this assay can detect is 131 copies/mL. A negative result does not preclude SARS-Cov-2 infection and should not be used as the sole basis for treatment or other patient management decisions. A negative result may occur with  improper specimen collection/handling, submission of specimen other than nasopharyngeal swab, presence of viral mutation(s) within the areas targeted by this assay, and inadequate number of viral copies (<131 copies/mL). A negative result must be combined with clinical observations, patient history, and epidemiological information. The expected result is Negative.  Fact Sheet for Patients:  PinkCheek.be  Fact Sheet for Healthcare Providers:  GravelBags.it  This test is no t yet approved or cleared by the [place] FDA and  has been authorized for detection and/or diagnosis of SARS-CoV-2 by FDA under an Emergency Use Authorization (EUA). This EUA will remain  in effect (meaning this test can be used) for the duration of the COVID-19 declaration under Section 564(b)(1) of the Act, 21 U.S.C. section 360bbb-3(b)(1), unless the authorization is terminated or revoked sooner.     Influenza A by PCR NEGATIVE NEGATIVE Final   Influenza B by PCR NEGATIVE NEGATIVE Final    Comment: (NOTE) The Xpert Xpress SARS-CoV-2/FLU/RSV assay is intended as an aid in  the diagnosis of influenza from Nasopharyngeal swab specimens and  should not be used as a sole basis for treatment. Nasal washings and  aspirates are unacceptable for Xpert Xpress SARS-CoV-2/FLU/RSV  testing.  Fact Sheet for Patients: PinkCheek.be  Fact Sheet for Healthcare  Providers: GravelBags.it  This test is not yet approved or cleared by the [place] FDA and  has been authorized for detection and/or diagnosis of SARS-CoV-2 by  FDA under an Emergency Use Authorization (EUA). This EUA will remain  in effect (meaning this test can be used) for the duration of the  Covid-19 declaration under Section 564(b)(1) of the Act, 21  U.S.C. section 360bbb-3(b)(1), unless the authorization is  terminated or revoked. Performed at [facility], [place] [address] [place], [place] [address]   MRSA PCR Screening     Status: None   Collection Time: 02/21/20  1:04 PM   Specimen: Nasal Mucosa; Nasopharyngeal  Result Value Ref Range Status   MRSA by PCR NEGATIVE NEGATIVE Final    Comment:        The GeneXpert MRSA Assay (FDA approved for NASAL specimens only), is one component of a comprehensive MRSA colonization surveillance program. It is not intended to diagnose MRSA infection nor to guide or monitor treatment for MRSA infections. Performed at [facility], [place] [address] [place], [place] [address]     Procedures and diagnostic studies:  No results found.  Medications:   . famotidine  20 mg Oral BID  . feeding supplement (ENSURE ENLIVE)  237 mL Oral TID BM  . melatonin  3 mg Oral QHS  . metoprolol tartrate  12.5 mg Oral BID  . mometasone-formoterol  2 puff Inhalation BID  . multivitamin with minerals  1 tablet Oral Daily  . pantoprazole  40 mg Oral BID  . thiamine  100 mg Oral Daily  . traZODone  100 mg Oral QHS  . cyanocobalamin  1,000 mcg Oral Daily   Continuous Infusions:  LOS: 4 days   Geradine Girt  [facility]   How to contact the [facility] Attending or Consulting provider [place] or covering provider during after hours [place], for this patient?  1. Check the care team in [facility] and look for a) attending/consulting TRH provider listed and b) the [facility] team listed 2. Log into [email or web address] and  use [place]'s universal password to access. If you do not have the password, please contact the hospital operator. 3. Locate the [facility] provider you are looking for under [facility] and page to a number that you can be directly reached. 4. If you still have difficulty reaching the provider, please page the [facility] (Director on Call) for the Hospitalists listed on amion for assistance.  02/25/2020, 1:21 PM

## 2020-02-25 NOTE — Progress Notes (Signed)
Pt requested to walk after their lunch. Pt walked outside of the hallway. Pt calm and cooperative, will continue to monitor pt.

## 2020-02-25 NOTE — Progress Notes (Signed)
Pt c/o nausea. Post anti-emetic administration, pt began to rock back and forth verbalizing hopelessness w/ chronic back/neck pain. Also verbalized unable to have long periods of relief from nausea since the covid vaccine he received in July. Allowed pt to voice feelings/concerns however unable to redirect pt to a calmer state. Pt began to pace in room. Sat in recliner then immediately stood and paced to the door followed by pusihing buttons on tv.  This RN continued to ask open ended questions regarding current thought process. Pt repeating "This is probably why my daughter & son-in-law kicked me out. I'm too much to handle."  Will administer scheduled sleep aids and prn tylenol once pt verbalizes nausea has passed.  1:1 sitter for safety.  Will con't to monitor.

## 2020-02-25 NOTE — Progress Notes (Signed)
Pt.s breakfast has arrived. Pt sitting up and eating his breakfast. Will continue to monitor pt.

## 2020-02-25 NOTE — Progress Notes (Signed)
Pt walked down the hallway. Pt calm and cooperative. Will continue to monitor pt.

## 2020-02-25 NOTE — Progress Notes (Signed)
Pt.s lunch has arrived. Pt sitting up and eating his lunch. Will continue to monitor pt.

## 2020-02-25 NOTE — Progress Notes (Signed)
Pt walked down the hallway, a 9 minute walk. Grabbed 2 cups of ice for pt. Pt now back in his room, will continue to monitor pt.

## 2020-02-25 NOTE — Progress Notes (Signed)
Pt.s dinner has arrived. Pt sitting up and eating his dinner. Will continue to monitor pt.

## 2020-02-26 DIAGNOSIS — T1491XA Suicide attempt, initial encounter: Secondary | ICD-10-CM | POA: Diagnosis not present

## 2020-02-26 DIAGNOSIS — I629 Nontraumatic intracranial hemorrhage, unspecified: Secondary | ICD-10-CM | POA: Diagnosis not present

## 2020-02-26 DIAGNOSIS — C9110 Chronic lymphocytic leukemia of B-cell type not having achieved remission: Secondary | ICD-10-CM | POA: Diagnosis not present

## 2020-02-26 LAB — BASIC METABOLIC PANEL
Anion gap: 10 (ref 5–15)
BUN: 17 mg/dL (ref 8–23)
CO2: 27 mmol/L (ref 22–32)
Calcium: 9.9 mg/dL (ref 8.9–10.3)
Chloride: 98 mmol/L (ref 98–111)
Creatinine, Ser: 1.17 mg/dL (ref 0.61–1.24)
GFR, Estimated: >60 mL/min (ref >60)
Glucose, Bld: 122 mg/dL — ABNORMAL HIGH (ref 70–99)
Potassium: 5.2 mmol/L — ABNORMAL HIGH (ref 3.5–5.1)
Sodium: 135 mmol/L (ref 135–145)

## 2020-02-26 NOTE — Progress Notes (Signed)
Patient relayed that he has not been able to have adequate sleep for the past year. Patient expresses concern about having a sleeping aid tonight -similar to med given last night. RN reassured that medication trazodone and melatonin- is scheduled daily at bedtime.

## 2020-02-26 NOTE — Progress Notes (Signed)
Progress Note    David Oneill  JJO:[id number] DOB: [birth year]  DOA: 02/21/2020 PCP: Patient, No Pcp Per    Brief Narrative:     Medical records reviewed and are as summarized below:  David Oneill is an 64 y.o. male with suicidal intent, presented after drug overdose with oxycodone and Restoril and was noted to have both a wrist lacerations.  CT head was noted with small occipital intraparenchymal hemorrhage with subarachnoid extension.  Awaiting geri-psych placement.   Assessment/Plan:   Active Problems:   SAH (subarachnoid hemorrhage) (HCC)   Suicide attempt ([place])   Intentional drug overdose ([place])   CLL (chronic lymphocytic leukemia) (HCC)   Non-aneurysmal small left occipital intraparenchymal hemorrhage with subarachnoid extension Acute toxic encephalopathy due to drug overdose with oxycodone and Restoril -Secondary to trauma in light of anti-coagulant use (Xarelto)  - Xarelto reversed with Kcentra in the ED - Neurosurgery consulted; appreciate their recommendations- resume xarelto on Monday - Repeat head CT stable -mental status stable  Chronic atrial fibrillation -can resume xraelto Monday -resume BB with holding parameters  Suicidal attempt with bilateral wrist laceration and drug overdose -psych consult- needs geri-psych placement -suicide precautions -medically stable for D/C  HFrEF, not in acute exacerbation  Recent Massive PE, anticoagulation is on hold -per Neurosurgery can resume on Monday  CLL -WBCs stable  obesity Body mass index is 31.45 kg/m.  Hypokalemia -replete  Insomnia -trazodone   Family Communication/Anticipated D/C date and plan/Code Status   DVT prophylaxis: scd Code Status: Full Code.  Disposition Plan: Status is: Inpatient  Remains inpatient appropriate because:Unsafe d/c plan   Dispo: The patient is from: Home              Anticipated d/c is to: inpatient psych              Anticipated d/c date is: when bed  available              Patient currently is medically stable to inpatient psych         Medical Consultants:    PCCM  psych    Subjective:   sleeping  Objective:    Vitals:   02/26/20 0300 02/26/20 0807 02/26/20 0923 02/26/20 1159  BP: 125/64  127/66 114/62  Pulse: 99  (!) 109 99  Resp: 18  18 20   Temp: 98.7 F (37.1 C)   98.3 F (36.8 C)  TempSrc: Oral   Oral  SpO2: 100% 95% 98% 99%  Weight: 105.2 kg     Height:        Intake/Output Summary (Last 24 hours) at 02/26/2020 1316 Last data filed at 02/26/2020 1234 Gross per 24 hour  Intake 2597 ml  Output 2250 ml  Net 347 ml   Filed Weights   02/24/20 0441 02/25/20 0420 02/26/20 0300  Weight: 104.6 kg 104.7 kg 105.2 kg    Exam:   General: Appearance:    Obese male in no acute distress, sleeping     Lungs:      respirations unlabored  Heart:    Normal heart rate. Normal rhythm. No murmurs, rubs, or gallops.           Data Reviewed:   I have personally reviewed following labs and imaging studies:  Labs: Labs show the following:   Basic Metabolic Panel: Recent Labs  Lab 02/21/20 0617 02/21/20 0617 02/21/20 1144 02/21/20 1144 02/21/20 1208 02/21/20 1208 02/22/20 0445 02/22/20 0445 02/23/20 1601 02/23/20 0932 02/24/20 0319 02/26/20 0304  NA 126*   < > 133*   < > 133*  --  135  --  137  --  136 135  K 3.3*   < >  --   --  2.9*   < > 3.5   < > 3.1*   < > 3.3* 5.2*  CL 93*  --   --   --   --   --  101  --  101  --  101 98  CO2 22  --   --   --   --   --  23  --  24  --  25 27  GLUCOSE 137*  --   --   --   --   --  126*  --  89  --  105* 122*  BUN 16  --   --   --   --   --  15  --  13  --  13 17  CREATININE 0.99  --   --   --   --   --  1.21  --  1.07  --  1.07 1.17  CALCIUM 8.3*  --   --   --   --   --  8.7*  --  8.8*  --  9.2 9.9  MG  --   --  2.0  --   --   --  2.0  --   --   --   --   --   PHOS  --   --  4.0  --   --   --  4.7*  --   --   --   --   --    < > = values in this  interval not displayed.   GFR Estimated Creatinine Clearance: 78.9 mL/min (by C-G formula based on SCr of 1.17 mg/dL). Liver Function Tests: Recent Labs  Lab 02/21/20 0617 02/22/20 0445 02/23/20 0545  AST 29 42* 43*  ALT 15 23 24   ALKPHOS 56 58 60  BILITOT 1.6* 1.5* 1.0  PROT 5.8* 6.2* 6.4*  ALBUMIN 3.3* 3.5 3.7   No results for input(s): LIPASE, AMYLASE in the last 168 hours. No results for input(s): AMMONIA in the last 168 hours. Coagulation profile Recent Labs  Lab 02/21/20 0644 02/21/20 1144  INR 1.3* 1.0    CBC: Recent Labs  Lab 02/21/20 0617 02/21/20 1208 02/22/20 0445 02/23/20 0545 02/24/20 0319  WBC 27.1*  --  24.0* 26.1* 26.7*  NEUTROABS  --   --   --  16.2* 10.7*  HGB 10.7* 11.2* 11.8* 11.9* 11.4*  HCT 33.0* 33.0* 36.4* 36.0* 35.2*  MCV 87.1  --  86.5 87.6 88.9  PLT 204  --  197 213 226   Cardiac Enzymes: No results for input(s): CKTOTAL, CKMB, CKMBINDEX, TROPONINI in the last 168 hours. BNP (last 3 results) No results for input(s): PROBNP in the last 8760 hours. CBG: Recent Labs  Lab 02/21/20 0739 02/21/20 2158  GLUCAP 138* 99   D-Dimer: No results for input(s): DDIMER in the last 72 hours. Hgb A1c: No results for input(s): HGBA1C in the last 72 hours. Lipid Profile: No results for input(s): CHOL, HDL, LDLCALC, TRIG, CHOLHDL, LDLDIRECT in the last 72 hours. Thyroid function studies: No results for input(s): TSH, T4TOTAL, T3FREE, THYROIDAB in the last 72 hours.  Invalid input(s): FREET3 Anemia work up: No results for input(s): VITAMINB12, FOLATE, FERRITIN, TIBC, IRON, RETICCTPCT in the last 72 hours. Sepsis Labs: Recent Labs  Lab 02/21/20 0617 02/22/20 0445 02/23/20 0545 02/24/20 0319  WBC 27.1* 24.0* 26.1* 26.7*    Microbiology Recent Results (from the past 240 hour(s))  Respiratory Panel by RT PCR (Flu A&B, Covid) - Nasopharyngeal Swab     Status: None   Collection Time: 02/21/20  6:58 AM   Specimen: Nasopharyngeal Swab    Result Value Ref Range Status   SARS Coronavirus 2 by RT PCR NEGATIVE NEGATIVE Final    Comment: (NOTE) SARS-CoV-2 target nucleic acids are NOT DETECTED.  The SARS-CoV-2 RNA is generally detectable in upper respiratoy specimens during the acute phase of infection. The lowest concentration of SARS-CoV-2 viral copies this assay can detect is 131 copies/mL. A negative result does not preclude SARS-Cov-2 infection and should not be used as the sole basis for treatment or other patient management decisions. A negative result may occur with  improper specimen collection/handling, submission of specimen other than nasopharyngeal swab, presence of viral mutation(s) within the areas targeted by this assay, and inadequate number of viral copies (<131 copies/mL). A negative result must be combined with clinical observations, patient history, and epidemiological information. The expected result is Negative.  Fact Sheet for Patients:  PinkCheek.be  Fact Sheet for Healthcare Providers:  GravelBags.it  This test is no t yet approved or cleared by the [place] FDA and  has been authorized for detection and/or diagnosis of SARS-CoV-2 by FDA under an Emergency Use Authorization (EUA). This EUA will remain  in effect (meaning this test can be used) for the duration of the COVID-19 declaration under Section 564(b)(1) of the Act, 21 U.S.C. section 360bbb-3(b)(1), unless the authorization is terminated or revoked sooner.     Influenza A by PCR NEGATIVE NEGATIVE Final   Influenza B by PCR NEGATIVE NEGATIVE Final    Comment: (NOTE) The Xpert Xpress SARS-CoV-2/FLU/RSV assay is intended as an aid in  the diagnosis of influenza from Nasopharyngeal swab specimens and  should not be used as a sole basis for treatment. Nasal washings and  aspirates are unacceptable for Xpert Xpress SARS-CoV-2/FLU/RSV  testing.  Fact Sheet for  Patients: PinkCheek.be  Fact Sheet for Healthcare Providers: GravelBags.it  This test is not yet approved or cleared by the [place] FDA and  has been authorized for detection and/or diagnosis of SARS-CoV-2 by  FDA under an Emergency Use Authorization (EUA). This EUA will remain  in effect (meaning this test can be used) for the duration of the  Covid-19 declaration under Section 564(b)(1) of the Act, 21  U.S.C. section 360bbb-3(b)(1), unless the authorization is  terminated or revoked. Performed at [facility], [place] [address], [place], [place] [address]   MRSA PCR Screening     Status: None   Collection Time: 02/21/20  1:04 PM   Specimen: Nasal Mucosa; Nasopharyngeal  Result Value Ref Range Status   MRSA by PCR NEGATIVE NEGATIVE Final    Comment:        The GeneXpert MRSA Assay (FDA approved for NASAL specimens only), is one component of a comprehensive MRSA colonization surveillance program. It is not intended to diagnose MRSA infection nor to guide or monitor treatment for MRSA infections. Performed at [facility], [place] [address], [place], [place] [address]     Procedures and diagnostic studies:  No results found.  Medications:   . famotidine  20 mg Oral BID  . feeding supplement (ENSURE ENLIVE)  237 mL Oral TID BM  . melatonin  3 mg Oral QHS  .  metoprolol tartrate  12.5 mg Oral BID  . mometasone-formoterol  2 puff Inhalation BID  . multivitamin with minerals  1 tablet Oral Daily  . pantoprazole  40 mg Oral BID  . thiamine  100 mg Oral Daily  . traZODone  100 mg Oral QHS  . cyanocobalamin  1,000 mcg Oral Daily   Continuous Infusions:   LOS: 5 days   Geradine Girt  [facility]   How to contact the [facility] Attending or Consulting provider [place] or covering provider during after hours [place], for this patient?  1. Check the care team in [facility] and look for a)  attending/consulting TRH provider listed and b) the [facility] team listed 2. Log into [email or web address] and use [place]'s universal password to access. If you do not have the password, please contact the hospital operator. 3. Locate the [facility] provider you are looking for under [facility] and page to a number that you can be directly reached. 4. If you still have difficulty reaching the provider, please page the [facility] (Director on Call) for the Hospitalists listed on amion for assistance.  02/26/2020, 1:16 PM

## 2020-02-26 NOTE — TOC Progression Note (Signed)
Transition of Care [facility]) - Progression Note    Patient Details  Name: David Oneill MRN: [id number] Date of Birth: April 23, 1956  Transition of [facility]) CM/SW [place], [place] Phone Number: [phone] 02/26/2020, 4:26 PM  Clinical Narrative:     CSW followed with [facility] in regards to referral that was sent. They stated that they had not received the clinicals therefore CSW refaxed clinicals to the fax number provided.[phone]. CSW updated weekday CSW..  TOC team will continue to assist with discharge planning needs.        Expected Discharge Plan and Services                                                 Social Determinants of Health (SDOH) Interventions    Readmission Risk Interventions No flowsheet data found.

## 2020-02-26 NOTE — Plan of Care (Signed)

## 2020-02-27 DIAGNOSIS — C9110 Chronic lymphocytic leukemia of B-cell type not having achieved remission: Secondary | ICD-10-CM | POA: Diagnosis not present

## 2020-02-27 DIAGNOSIS — T1491XA Suicide attempt, initial encounter: Secondary | ICD-10-CM | POA: Diagnosis not present

## 2020-02-27 DIAGNOSIS — I609 Nontraumatic subarachnoid hemorrhage, unspecified: Secondary | ICD-10-CM | POA: Diagnosis not present

## 2020-02-27 LAB — BASIC METABOLIC PANEL
Anion gap: 11 (ref 5–15)
BUN: 20 mg/dL (ref 8–23)
CO2: 27 mmol/L (ref 22–32)
Calcium: 9.6 mg/dL (ref 8.9–10.3)
Chloride: 98 mmol/L (ref 98–111)
Creatinine, Ser: 1.27 mg/dL — ABNORMAL HIGH (ref 0.61–1.24)
GFR, Estimated: 59 mL/min — ABNORMAL LOW (ref >60)
Glucose, Bld: 130 mg/dL — ABNORMAL HIGH (ref 70–99)
Potassium: 4.1 mmol/L (ref 3.5–5.1)
Sodium: 136 mmol/L (ref 135–145)

## 2020-02-27 MED ORDER — RIVAROXABAN 20 MG PO TABS
20.0000 mg | ORAL_TABLET | Freq: Every day | ORAL | Status: DC
  Administered 2020-02-27 – 2020-03-04 (×7): 20 mg via ORAL
  Filled 2020-02-27 (×7): qty 1

## 2020-02-27 NOTE — Progress Notes (Signed)
Progress Note    David Oneill  WGY:[id number] DOB: 02-Jul-1955  DOA: 02/21/2020 PCP: Patient, No Pcp Per    Brief Narrative:     Medical records reviewed and are as summarized below:  David Oneill is an 64 y.o. male with suicidal intent, presented after drug overdose with oxycodone and Restoril and was noted to have both a wrist lacerations.  CT head was noted with small occipital intraparenchymal hemorrhage with subarachnoid extension.  Awaiting geri-psych placement.   Assessment/Plan:   Active Problems:   SAH (subarachnoid hemorrhage) (HCC)   Suicide attempt ([place])   Intentional drug overdose ([place])   CLL (chronic lymphocytic leukemia) (HCC)   Non-aneurysmal small left occipital intraparenchymal hemorrhage with subarachnoid extension Acute toxic encephalopathy due to drug overdose with oxycodone and Restoril -Secondary to trauma in light of anti-coagulant use (Xarelto)  - Xarelto reversed with Kcentra in the ED - Neurosurgery consulted; appreciate their recommendations- resume xarelto 10/11 - Repeat head CT stable -mental status stable  Chronic atrial fibrillation/flutter -resumed xarelto -resumed BB with holding parameters  Suicidal attempt with bilateral wrist laceration and drug overdose -psych consult- needs geri-psych placement -suicide precautions -medically stable for D/C  HFrEF, not in acute exacerbation  Recent Massive PE -xarelto resumed  CLL -WBCs stable  obesity Body mass index is 31.04 kg/m.  Hypokalemia -repleted  Insomnia -trazodone   Family Communication/Anticipated D/C date and plan/Code Status   DVT prophylaxis: scd Code Status: Full Code.  Disposition Plan: Status is: Inpatient  Remains inpatient appropriate because:Unsafe d/c plan   Dispo: The patient is from: Home              Anticipated d/c is to: inpatient psych              Anticipated d/c date is: when bed available              Patient currently is medically  stable to inpatient psych         Medical Consultants:    PCCM  psych    Subjective:   Thinks he is sleeping better and nausea better  Objective:    Vitals:   02/27/20 0300 02/27/20 0356 02/27/20 0734 02/27/20 1146  BP:  138/69 (!) 123/57 (!) 115/58  Pulse:  88 88 95  Resp:  18 20 20   Temp:  98.6 F (37 C) 98.3 F (36.8 C) 98.5 F (36.9 C)  TempSrc:  Oral  Oral  SpO2:  99% 100% 100%  Weight: 103.8 kg     Height:        Intake/Output Summary (Last 24 hours) at 02/27/2020 1407 Last data filed at 02/27/2020 7793 Gross per 24 hour  Intake 1200 ml  Output 2515 ml  Net -1315 ml   Filed Weights   02/25/20 0420 02/26/20 0300 02/27/20 0300  Weight: 104.7 kg 105.2 kg 103.8 kg    Exam:   General: Appearance:    Obese male in no acute distress     Lungs:     respirations unlabored  Heart:    Normal heart rate.   MS:   All extremities are intact.   Neurologic:   Moves all 4 ext    Data Reviewed:   I have personally reviewed following labs and imaging studies:  Labs: Labs show the following:   Basic Metabolic Panel: Recent Labs  Lab 02/21/20 1144 02/21/20 1208 02/22/20 0445 02/22/20 0445 02/23/20 0545 02/23/20 0545 02/24/20 0319 02/24/20 0319 02/26/20 0304 02/27/20 0758  NA 133*   < >  135  --  137  --  136  --  135 136  K  --    < > 3.5   < > 3.1*   < > 3.3*   < > 5.2* 4.1  CL  --   --  101  --  101  --  101  --  98 98  CO2  --   --  23  --  24  --  25  --  27 27  GLUCOSE  --   --  126*  --  89  --  105*  --  122* 130*  BUN  --   --  15  --  13  --  13  --  17 20  CREATININE  --   --  1.21  --  1.07  --  1.07  --  1.17 1.27*  CALCIUM  --   --  8.7*  --  8.8*  --  9.2  --  9.9 9.6  MG 2.0  --  2.0  --   --   --   --   --   --   --   PHOS 4.0  --  4.7*  --   --   --   --   --   --   --    < > = values in this interval not displayed.   GFR Estimated Creatinine Clearance: 72.3 mL/min (A) (by C-G formula based on SCr of 1.27 mg/dL  (H)). Liver Function Tests: Recent Labs  Lab 02/21/20 0617 02/22/20 0445 02/23/20 0545  AST 29 42* 43*  ALT 15 23 24   ALKPHOS 56 58 60  BILITOT 1.6* 1.5* 1.0  PROT 5.8* 6.2* 6.4*  ALBUMIN 3.3* 3.5 3.7   No results for input(s): LIPASE, AMYLASE in the last 168 hours. No results for input(s): AMMONIA in the last 168 hours. Coagulation profile Recent Labs  Lab 02/21/20 0644 02/21/20 1144  INR 1.3* 1.0    CBC: Recent Labs  Lab 02/21/20 0617 02/21/20 1208 02/22/20 0445 02/23/20 0545 02/24/20 0319  WBC 27.1*  --  24.0* 26.1* 26.7*  NEUTROABS  --   --   --  16.2* 10.7*  HGB 10.7* 11.2* 11.8* 11.9* 11.4*  HCT 33.0* 33.0* 36.4* 36.0* 35.2*  MCV 87.1  --  86.5 87.6 88.9  PLT 204  --  197 213 226   Cardiac Enzymes: No results for input(s): CKTOTAL, CKMB, CKMBINDEX, TROPONINI in the last 168 hours. BNP (last 3 results) No results for input(s): PROBNP in the last 8760 hours. CBG: Recent Labs  Lab 02/21/20 0739 02/21/20 2158  GLUCAP 138* 99   D-Dimer: No results for input(s): DDIMER in the last 72 hours. Hgb A1c: No results for input(s): HGBA1C in the last 72 hours. Lipid Profile: No results for input(s): CHOL, HDL, LDLCALC, TRIG, CHOLHDL, LDLDIRECT in the last 72 hours. Thyroid function studies: No results for input(s): TSH, T4TOTAL, T3FREE, THYROIDAB in the last 72 hours.  Invalid input(s): FREET3 Anemia work up: No results for input(s): VITAMINB12, FOLATE, FERRITIN, TIBC, IRON, RETICCTPCT in the last 72 hours. Sepsis Labs: Recent Labs  Lab 02/21/20 0617 02/22/20 0445 02/23/20 0545 02/24/20 0319  WBC 27.1* 24.0* 26.1* 26.7*    Microbiology Recent Results (from the past 240 hour(s))  Respiratory Panel by RT PCR (Flu A&B, Covid) - Nasopharyngeal Swab     Status: None   Collection Time: 02/21/20  6:58 AM   Specimen: Nasopharyngeal Swab  Result Value  Ref Range Status   SARS Coronavirus 2 by RT PCR NEGATIVE NEGATIVE Final    Comment: (NOTE) SARS-CoV-2  target nucleic acids are NOT DETECTED.  The SARS-CoV-2 RNA is generally detectable in upper respiratoy specimens during the acute phase of infection. The lowest concentration of SARS-CoV-2 viral copies this assay can detect is 131 copies/mL. A negative result does not preclude SARS-Cov-2 infection and should not be used as the sole basis for treatment or other patient management decisions. A negative result may occur with  improper specimen collection/handling, submission of specimen other than nasopharyngeal swab, presence of viral mutation(s) within the areas targeted by this assay, and inadequate number of viral copies (<131 copies/mL). A negative result must be combined with clinical observations, patient history, and epidemiological information. The expected result is Negative.  Fact Sheet for Patients:  PinkCheek.be  Fact Sheet for Healthcare Providers:  GravelBags.it  This test is no t yet approved or cleared by the [place] FDA and  has been authorized for detection and/or diagnosis of SARS-CoV-2 by FDA under an Emergency Use Authorization (EUA). This EUA will remain  in effect (meaning this test can be used) for the duration of the COVID-19 declaration under Section 564(b)(1) of the Act, 21 U.S.C. section 360bbb-3(b)(1), unless the authorization is terminated or revoked sooner.     Influenza A by PCR NEGATIVE NEGATIVE Final   Influenza B by PCR NEGATIVE NEGATIVE Final    Comment: (NOTE) The Xpert Xpress SARS-CoV-2/FLU/RSV assay is intended as an aid in  the diagnosis of influenza from Nasopharyngeal swab specimens and  should not be used as a sole basis for treatment. Nasal washings and  aspirates are unacceptable for Xpert Xpress SARS-CoV-2/FLU/RSV  testing.  Fact Sheet for Patients: PinkCheek.be  Fact Sheet for Healthcare  Providers: GravelBags.it  This test is not yet approved or cleared by the [place] FDA and  has been authorized for detection and/or diagnosis of SARS-CoV-2 by  FDA under an Emergency Use Authorization (EUA). This EUA will remain  in effect (meaning this test can be used) for the duration of the  Covid-19 declaration under Section 564(b)(1) of the Act, 21  U.S.C. section 360bbb-3(b)(1), unless the authorization is  terminated or revoked. Performed at [facility], [place] [address] [place], [place] [address]   MRSA PCR Screening     Status: None   Collection Time: 02/21/20  1:04 PM   Specimen: Nasal Mucosa; Nasopharyngeal  Result Value Ref Range Status   MRSA by PCR NEGATIVE NEGATIVE Final    Comment:        The GeneXpert MRSA Assay (FDA approved for NASAL specimens only), is one component of a comprehensive MRSA colonization surveillance program. It is not intended to diagnose MRSA infection nor to guide or monitor treatment for MRSA infections. Performed at [facility], [place] [address], [place],  [address]     Procedures and diagnostic studies:  No results found.  Medications:   . famotidine  20 mg Oral BID  . feeding supplement (ENSURE ENLIVE)  237 mL Oral TID BM  . melatonin  3 mg Oral QHS  . metoprolol tartrate  12.5 mg Oral BID  . mometasone-formoterol  2 puff Inhalation BID  . multivitamin with minerals  1 tablet Oral Daily  . pantoprazole  40 mg Oral BID  . rivaroxaban  20 mg Oral QHS  . thiamine  100 mg Oral Daily  . traZODone  100 mg Oral QHS  . cyanocobalamin  1,000 mcg Oral Daily   Continuous Infusions:   LOS: 6 days   Geradine Girt  [facility]   How to contact the [facility] Attending or Consulting provider [place] or covering provider during after hours [place], for this patient?  1. Check the care team in [facility] and look for a) attending/consulting [facility] provider listed and b) the [facility] team  listed 2. Log into [email or web address] and use [place]'s universal password to access. If you do not have the password, please contact the hospital operator. 3. Locate the [facility] provider you are looking for under [facility] and page to a number that you can be directly reached. 4. If you still have difficulty reaching the provider, please page the [facility] (Director on Call) for the Hospitalists listed on amion for assistance.  02/27/2020, 2:07 PM

## 2020-02-27 NOTE — Plan of Care (Signed)
  Problem: Education: Goal: Knowledge of General Education information will improve Description Including pain rating scale, medication(s)/side effects and non-pharmacologic comfort measures Outcome: Progressing   

## 2020-02-27 NOTE — Progress Notes (Addendum)
CSW spoke with [facility] in reference to possible placement for pt, declined due to medical needs. CSW also reached out to [facility] to see if any geri-psych beds available, currently there is a waiting list, admissions couldn't give a timeframe of when bed would be available. CSW will continue to follow.

## 2020-02-27 NOTE — Care Management Important Message (Signed)
Important Message  Patient Details  Name: David Oneill MRN: [id number] Date of Birth: [birth year]   Medicare Important Message Given:  Yes     Shelda Altes 02/27/2020, 11:46 AM

## 2020-02-28 DIAGNOSIS — I629 Nontraumatic intracranial hemorrhage, unspecified: Secondary | ICD-10-CM | POA: Diagnosis not present

## 2020-02-28 DIAGNOSIS — F332 Major depressive disorder, recurrent severe without psychotic features: Secondary | ICD-10-CM | POA: Diagnosis not present

## 2020-02-28 DIAGNOSIS — T50902A Poisoning by unspecified drugs, medicaments and biological substances, intentional self-harm, initial encounter: Secondary | ICD-10-CM | POA: Diagnosis not present

## 2020-02-28 DIAGNOSIS — C9110 Chronic lymphocytic leukemia of B-cell type not having achieved remission: Secondary | ICD-10-CM | POA: Diagnosis not present

## 2020-02-28 DIAGNOSIS — T1491XA Suicide attempt, initial encounter: Secondary | ICD-10-CM | POA: Diagnosis not present

## 2020-02-28 LAB — THC,MS,WB/SP RFX
Cannabidiol: NEGATIVE ng/mL
Cannabinoid Confirmation: POSITIVE
Cannabinol: NEGATIVE ng/mL
Hydroxy-THC: 1.3 ng/mL

## 2020-02-28 MED ORDER — THIAMINE HCL 100 MG PO TABS
100.0000 mg | ORAL_TABLET | Freq: Every day | ORAL | Status: DC
  Administered 2020-02-28 – 2020-03-27 (×29): 100 mg via ORAL
  Filled 2020-02-28 (×30): qty 1

## 2020-02-28 MED ORDER — ESCITALOPRAM OXALATE 10 MG PO TABS
5.0000 mg | ORAL_TABLET | Freq: Every day | ORAL | Status: DC
  Administered 2020-02-28 – 2020-03-09 (×11): 5 mg via ORAL
  Filled 2020-02-28 (×11): qty 1

## 2020-02-28 MED ORDER — MIRTAZAPINE 15 MG PO TBDP
15.0000 mg | ORAL_TABLET | Freq: Every day | ORAL | Status: DC
  Administered 2020-02-28 – 2020-03-02 (×4): 15 mg via ORAL
  Filled 2020-02-28 (×5): qty 1

## 2020-02-28 MED ORDER — METOCLOPRAMIDE HCL 5 MG/ML IJ SOLN
5.0000 mg | Freq: Four times a day (QID) | INTRAMUSCULAR | Status: DC
  Administered 2020-02-28 – 2020-03-02 (×13): 5 mg via INTRAVENOUS
  Filled 2020-02-28 (×13): qty 2

## 2020-02-28 NOTE — Consult Note (Signed)
[facility] Psychiatry Consult   Reason for Consult:  Overdose  Referring Physician:  Dr Eliseo Squires Patient Identification: David Oneill MRN:  [id number] Principal Diagnosis: CVA Diagnosis:  Active Problems:   Intentional drug overdose ([place])   Major depressive disorder, recurrent severe without psychotic features ([place])   SAH (subarachnoid hemorrhage) ([place])   Suicide attempt ([place])   CLL (chronic lymphocytic leukemia) ([place])   Total Time spent with patient: 45 minutes  Subjective:   David Oneill is a 64 y.o. male patient admitted with intentional overdose. Patient states that he has been feeling "extremely nauseous" today and reports that he "just wants this nausea to go away". He states that he is in the hospital related to 2 Zanaflex that he took to help him sleep and that he woke up in his daughter's front yard and "does not know what all happened that night", but denies that he was attempting to end his life. Minimizes his suicide attempt of an overdose with opiates and Restoril requiring Narcan twice to revive.  Also, cut his wrists requiring sutures.  Inconsistent historian based on previous notes.  He reports that he currently lives with his daughter but has been told that he is not able to return to stay with her after this hospital discharge. Patient states that this may be due to an incident that occurred shortly after he was vaccinated with his first dose of the COVID-19 vaccine and the next day he had "blood clots and a heart attack" and is now unable to drive. He reports that his daughter wanted him to live with her and her family and that he "gave her all of my money so she could build a house". He reports that she became angry with him after he bought CBD oil at the farmer's market to help with his nausea. Patient has PMH significant for depression. Continues to meet inpatient psychiatric hospitalization as he remains impulsive and wants to "know what's wrong with my head."     HPI per MD: 64 year old male with suicidal/homicidal intent, presented after drug overdose with oxycodone and Restoril and was noted to have both a wrist lacerations.  CT head was noted with small occipital intraparenchymal hemorrhage with subarachnoid extension.  Past Psychiatric History: depression  Risk to Self:  yes Risk to Others:  none Prior Inpatient Therapy:  denies Prior Outpatient Therapy:  none  Past Medical History:  Past Medical History:  Diagnosis Date  . Chronic back pain   . Suicide attempt ([place]) 02/22/2020    Past Surgical History:  Procedure Laterality Date  . ABLATION OF DYSRHYTHMIC FOCUS     Family History: History reviewed. No pertinent family history. Family Psychiatric  History: none Social History:  Social History   Substance and Sexual Activity  Alcohol Use Not Currently     Social History   Substance and Sexual Activity  Drug Use Yes  . Types: Marijuana    Social History   Socioeconomic History  . Marital status: Single    Spouse name: Not on file  . Number of children: Not on file  . Years of education: Not on file  . Highest education level: Not on file  Occupational History  . Not on file  Tobacco Use  . Smoking status: Never Smoker  . Smokeless tobacco: Never Used  Vaping Use  . Vaping Use: Never used  Substance and Sexual Activity  . Alcohol use: Not Currently  . Drug use: Yes    Types: Marijuana  . Sexual  activity: Not on file  Other Topics Concern  . Not on file  Social History Narrative  . Not on file   Social Determinants of Health   Financial Resource Strain:   . Difficulty of Paying Living Expenses: Not on file  Food Insecurity:   . Worried About Charity fundraiser in the Last Year: Not on file  . Ran Out of Food in the Last Year: Not on file  Transportation Needs:   . Lack of Transportation (Medical): Not on file  . Lack of Transportation (Non-Medical): Not on file  Physical Activity:   . Days of Exercise  per Week: Not on file  . Minutes of Exercise per Session: Not on file  Stress:   . Feeling of Stress : Not on file  Social Connections:   . Frequency of Communication with Friends and Family: Not on file  . Frequency of Social Gatherings with Friends and Family: Not on file  . Attends Religious Services: Not on file  . Active Member of Clubs or Organizations: Not on file  . Attends Archivist Meetings: Not on file  . Marital Status: Not on file   Additional Social History:    Allergies:  Not on File  Labs:  Results for orders placed or performed during the hospital encounter of 02/21/20 (from the past 48 hour(s))  Basic metabolic panel     Status: Abnormal   Collection Time: 02/27/20  7:58 AM  Result Value Ref Range   Sodium 136 135 - 145 mmol/L   Potassium 4.1 3.5 - 5.1 mmol/L    Comment: NO VISIBLE HEMOLYSIS   Chloride 98 98 - 111 mmol/L   CO2 27 22 - 32 mmol/L   Glucose, Bld 130 (H) 70 - 99 mg/dL    Comment: Glucose reference range applies only to samples taken after fasting for at least 8 hours.   BUN 20 8 - 23 mg/dL   Creatinine, Ser 1.27 (H) 0.61 - 1.24 mg/dL   Calcium 9.6 8.9 - 10.3 mg/dL   GFR, Estimated 59 (L) >60 mL/min   Anion gap 11 5 - 15    Comment: Performed at [facility] [address] [place], [place] [address]    Current Facility-Administered Medications  Medication Dose Route Frequency Provider Last Rate Last Admin  . acetaminophen (TYLENOL) tablet 650 mg  650 mg Oral Q6H PRN Jose Persia, MD   650 mg at 02/27/20 0348  . albuterol (PROVENTIL) (2.5 MG/3ML) 0.083% nebulizer solution 2.5 mg  2.5 mg Nebulization Q2H PRN Magdalen Spatz, NP      . docusate sodium (COLACE) capsule 100 mg  100 mg Oral BID PRN Magdalen Spatz, NP   100 mg at 02/27/20 2106  . famotidine (PEPCID) tablet 20 mg  20 mg Oral BID Eulogio Bear U, DO   20 mg at 02/28/20 0901  . feeding supplement (ENSURE ENLIVE) (ENSURE ENLIVE) liquid 237 mL  237 mL Oral TID BM  Jacky Kindle, MD   237 mL at 02/28/20 0901  . fluticasone (FLONASE) 50 MCG/ACT nasal spray 2 spray  2 spray Each Nare Daily PRN Eulogio Bear U, DO   2 spray at 02/27/20 0913  . melatonin tablet 3 mg  3 mg Oral QHS Vann, Jessica U, DO   3 mg at 02/27/20 2106  . metoCLOPramide (REGLAN) injection 5 mg  5 mg Intravenous Q6H Vann, Jessica U, DO   5 mg at 02/28/20 1358  . metoprolol tartrate (  LOPRESSOR) tablet 12.5 mg  12.5 mg Oral BID Eulogio Bear U, DO   12.5 mg at 02/28/20 0901  . mometasone-formoterol (DULERA) 100-5 MCG/ACT inhaler 2 puff  2 puff Inhalation BID Jose Persia, MD   2 puff at 02/28/20 0848  . multivitamin with minerals tablet 1 tablet  1 tablet Oral Daily Jacky Kindle, MD   1 tablet at 02/28/20 0900  . naloxone [facility]) injection 0.4 mg  0.4 mg Intravenous PRN Juanito Doom, MD   0.4 mg at 02/21/20 2202  . ondansetron (ZOFRAN) injection 4 mg  4 mg Intravenous Q6H PRN Magdalen Spatz, NP   4 mg at 02/28/20 0710  . pantoprazole (PROTONIX) EC tablet 40 mg  40 mg Oral BID Eulogio Bear U, DO   40 mg at 02/28/20 0900  . polyethylene glycol (MIRALAX / GLYCOLAX) packet 17 g  17 g Oral Daily PRN Magdalen Spatz, NP   17 g at 02/27/20 2105  . rivaroxaban (XARELTO) tablet 20 mg  20 mg Oral QHS Vann, Jessica U, DO   20 mg at 02/27/20 2106  . thiamine tablet 100 mg  100 mg Oral Daily Jose Persia, MD   100 mg at 02/28/20 0900  . traZODone (DESYREL) tablet 100 mg  100 mg Oral QHS Vann, Jessica U, DO   100 mg at 02/27/20 2106  . vitamin B-12 (CYANOCOBALAMIN) tablet 1,000 mcg  1,000 mcg Oral Daily Eulogio Bear U, DO   1,000 mcg at 02/28/20 0901    Musculoskeletal: Strength & Muscle Tone: increased Gait & Station: normal Patient leans: N/A  Psychiatric Specialty Exam: Physical Exam Vitals and nursing note reviewed.  Constitutional:      Appearance: Normal appearance.  HENT:     Head: Normocephalic.     Nose: Nose normal.  Pulmonary:     Effort: Pulmonary effort is normal.   Musculoskeletal:        General: Normal range of motion.     Cervical back: Normal range of motion.  Neurological:     General: No focal deficit present.     Mental Status: He is alert and oriented to person, place, and time.  Psychiatric:        Attention and Perception: Perception normal.        Mood and Affect: Mood is anxious and depressed.        Speech: Speech normal.        Behavior: Behavior is agitated.        Thought Content: Thought content includes suicidal ideation. Thought content includes suicidal plan.        Cognition and Memory: Cognition and memory normal.        Judgment: Judgment is impulsive.     Review of Systems  Gastrointestinal: Positive for nausea.  Psychiatric/Behavioral: Positive for agitation, dysphoric mood, self-injury and suicidal ideas. The patient is nervous/anxious.   All other systems reviewed and are negative.   Blood pressure (!) 160/82, pulse 81, temperature 98.3 F (36.8 C), temperature source Oral, resp. rate 20, height 6' (1.829 m), weight 104.8 kg, SpO2 98 %.Body mass index is 31.33 kg/m.  General Appearance: Disheveled  Eye Contact:  Fair  Speech:  Normal Rate  Volume:  Normal  Mood:  Anxious and Depressed  Affect:  Congruent  Thought Process:  Coherent and Descriptions of Associations: Intact  Orientation:  Full (Time, Place, and Person)  Thought Content:  Rumination  Suicidal Thoughts:  Yes.  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  Waylan Boga, NP 02/28/2020 2:37 PM

## 2020-02-28 NOTE — Progress Notes (Signed)
Progress Note    David Oneill  DXI:[id number] DOB: 12-04-1955  DOA: 02/21/2020 PCP: Patient, No Pcp Per    Brief Narrative:     Medical records reviewed and are as summarized below:  David Oneill is an 64 y.o. male with suicidal intent, presented after drug overdose with oxycodone and Restoril and was noted to have both a wrist lacerations.  CT head was noted with small occipital intraparenchymal hemorrhage with subarachnoid extension.  Awaiting geri-psych placement.   Assessment/Plan:   Active Problems:   SAH (subarachnoid hemorrhage) (HCC)   Suicide attempt ([place])   Intentional drug overdose ([place])   CLL (chronic lymphocytic leukemia) (HCC)   Major depressive disorder, recurrent severe without psychotic features ([place])   Non-aneurysmal small left occipital intraparenchymal hemorrhage with subarachnoid extension Acute toxic encephalopathy due to drug overdose with oxycodone and Restoril -Secondary to trauma in light of anti-coagulant use (Xarelto)  - Xarelto reversed with Kcentra in the ED - Neurosurgery consulted; appreciate their recommendations- resumed xarelto 10/11 - Repeat head CT stable -mental status stable  Chronic atrial fibrillation/flutter -resumed xarelto -resumed BB with holding parameters  Suicidal attempt with bilateral wrist laceration and drug overdose -psych consult- needs geri-psych placement -suicide precautions -SW asked for psych re-consult -medically stable for D/C  HFrEF, not in acute exacerbation  Recent Massive PE -xarelto resumed  CLL -WBCs stable  obesity Body mass index is 31.33 kg/m.  Hypokalemia -repleted  Insomnia -trazodone   Family Communication/Anticipated D/C date and plan/Code Status   DVT prophylaxis: scd Code Status: Full Code.  Disposition Plan: Status is: Inpatient  Remains inpatient appropriate because:Unsafe d/c plan   Dispo: The patient is from: Home              Anticipated d/c is to:  inpatient psych              Anticipated d/c date is: when bed available              Patient currently is medically stable to inpatient psych         Medical Consultants:    PCCM  psych    Subjective:   C/o nausea  Objective:    Vitals:   02/27/20 2030 02/28/20 0553 02/28/20 0848 02/28/20 1138  BP: 125/63 (!) 150/70  (!) 160/82  Pulse:  92  81  Resp: 20   20  Temp: 98.4 F (36.9 C) 98.4 F (36.9 C)  98.3 F (36.8 C)  TempSrc: Oral Oral  Oral  SpO2: 97%  98% 98%  Weight:  104.8 kg    Height:  6' (1.829 m)      Intake/Output Summary (Last 24 hours) at 02/28/2020 1502 Last data filed at 02/28/2020 0655 Gross per 24 hour  Intake 240 ml  Output 3400 ml  Net -3160 ml   Filed Weights   02/26/20 0300 02/27/20 0300 02/28/20 0553  Weight: 105.2 kg 103.8 kg 104.8 kg    Exam:   General: Appearance:    Obese male in no acute distress     Lungs:     respirations unlabored  Heart:    Normal heart rate. Normal rhythm. No murmurs, rubs, or gallops.   MS:   All extremities are intact.   Neurologic:   Awake, alert, oriented x 3. In room pacing at times    Data Reviewed:   I have personally reviewed following labs and imaging studies:  Labs: Labs show the following:   Basic Metabolic Panel: Recent Labs  Lab 02/22/20 0445 02/22/20 0445 02/23/20 0545 02/23/20 0545 02/24/20 0319 02/24/20 0319 02/26/20 0304 02/27/20 0758  NA 135  --  137  --  136  --  135 136  K 3.5   < > 3.1*   < > 3.3*   < > 5.2* 4.1  CL 101  --  101  --  101  --  98 98  CO2 23  --  24  --  25  --  27 27  GLUCOSE 126*  --  89  --  105*  --  122* 130*  BUN 15  --  13  --  13  --  17 20  CREATININE 1.21  --  1.07  --  1.07  --  1.17 1.27*  CALCIUM 8.7*  --  8.8*  --  9.2  --  9.9 9.6  MG 2.0  --   --   --   --   --   --   --   PHOS 4.7*  --   --   --   --   --   --   --    < > = values in this interval not displayed.   GFR Estimated Creatinine Clearance: 72.6 mL/min (A) (by C-G  formula based on SCr of 1.27 mg/dL (H)). Liver Function Tests: Recent Labs  Lab 02/22/20 0445 02/23/20 0545  AST 42* 43*  ALT 23 24  ALKPHOS 58 60  BILITOT 1.5* 1.0  PROT 6.2* 6.4*  ALBUMIN 3.5 3.7   No results for input(s): LIPASE, AMYLASE in the last 168 hours. No results for input(s): AMMONIA in the last 168 hours. Coagulation profile No results for input(s): INR, PROTIME in the last 168 hours.  CBC: Recent Labs  Lab 02/22/20 0445 02/23/20 0545 02/24/20 0319  WBC 24.0* 26.1* 26.7*  NEUTROABS  --  16.2* 10.7*  HGB 11.8* 11.9* 11.4*  HCT 36.4* 36.0* 35.2*  MCV 86.5 87.6 88.9  PLT 197 213 226   Cardiac Enzymes: No results for input(s): CKTOTAL, CKMB, CKMBINDEX, TROPONINI in the last 168 hours. BNP (last 3 results) No results for input(s): PROBNP in the last 8760 hours. CBG: Recent Labs  Lab 02/21/20 2158  GLUCAP 99   D-Dimer: No results for input(s): DDIMER in the last 72 hours. Hgb A1c: No results for input(s): HGBA1C in the last 72 hours. Lipid Profile: No results for input(s): CHOL, HDL, LDLCALC, TRIG, CHOLHDL, LDLDIRECT in the last 72 hours. Thyroid function studies: No results for input(s): TSH, T4TOTAL, T3FREE, THYROIDAB in the last 72 hours.  Invalid input(s): FREET3 Anemia work up: No results for input(s): VITAMINB12, FOLATE, FERRITIN, TIBC, IRON, RETICCTPCT in the last 72 hours. Sepsis Labs: Recent Labs  Lab 02/22/20 0445 02/23/20 0545 02/24/20 0319  WBC 24.0* 26.1* 26.7*    Microbiology Recent Results (from the past 240 hour(s))  Respiratory Panel by RT PCR (Flu A&B, Covid) - Nasopharyngeal Swab     Status: None   Collection Time: 02/21/20  6:58 AM   Specimen: Nasopharyngeal Swab  Result Value Ref Range Status   SARS Coronavirus 2 by RT PCR NEGATIVE NEGATIVE Final    Comment: (NOTE) SARS-CoV-2 target nucleic acids are NOT DETECTED.  The SARS-CoV-2 RNA is generally detectable in upper respiratoy specimens during the acute phase of  infection. The lowest concentration of SARS-CoV-2 viral copies this assay can detect is 131 copies/mL. A negative result does not preclude SARS-Cov-2 infection and should not be used as the sole basis  for treatment or other patient management decisions. A negative result may occur with  improper specimen collection/handling, submission of specimen other than nasopharyngeal swab, presence of viral mutation(s) within the areas targeted by this assay, and inadequate number of viral copies (<131 copies/mL). A negative result must be combined with clinical observations, patient history, and epidemiological information. The expected result is Negative.  Fact Sheet for Patients:  PinkCheek.be  Fact Sheet for Healthcare Providers:  GravelBags.it  This test is no t yet approved or cleared by the [place] FDA and  has been authorized for detection and/or diagnosis of SARS-CoV-2 by FDA under an Emergency Use Authorization (EUA). This EUA will remain  in effect (meaning this test can be used) for the duration of the COVID-19 declaration under Section 564(b)(1) of the Act, 21 U.S.C. section 360bbb-3(b)(1), unless the authorization is terminated or revoked sooner.     Influenza A by PCR NEGATIVE NEGATIVE Final   Influenza B by PCR NEGATIVE NEGATIVE Final    Comment: (NOTE) The Xpert Xpress SARS-CoV-2/FLU/RSV assay is intended as an aid in  the diagnosis of influenza from Nasopharyngeal swab specimens and  should not be used as a sole basis for treatment. Nasal washings and  aspirates are unacceptable for Xpert Xpress SARS-CoV-2/FLU/RSV  testing.  Fact Sheet for Patients: PinkCheek.be  Fact Sheet for Healthcare Providers: GravelBags.it  This test is not yet approved or cleared by the [place] FDA and  has been authorized for detection and/or diagnosis of SARS-CoV-2  by  FDA under an Emergency Use Authorization (EUA). This EUA will remain  in effect (meaning this test can be used) for the duration of the  Covid-19 declaration under Section 564(b)(1) of the Act, 21  U.S.C. section 360bbb-3(b)(1), unless the authorization is  terminated or revoked. Performed at [facility], [place] [address], [place], [place] [address]   MRSA PCR Screening     Status: None   Collection Time: 02/21/20  1:04 PM   Specimen: Nasal Mucosa; Nasopharyngeal  Result Value Ref Range Status   MRSA by PCR NEGATIVE NEGATIVE Final    Comment:        The GeneXpert MRSA Assay (FDA approved for NASAL specimens only), is one component of a comprehensive MRSA colonization surveillance program. It is not intended to diagnose MRSA infection nor to guide or monitor treatment for MRSA infections. Performed at [facility], [place] [address] [place], [place] [address]     Procedures and diagnostic studies:  No results found.  Medications:    famotidine  20 mg Oral BID   feeding supplement (ENSURE ENLIVE)  237 mL Oral TID BM   melatonin  3 mg Oral QHS   metoCLOPramide (REGLAN) injection  5 mg Intravenous Q6H   metoprolol tartrate  12.5 mg Oral BID   mometasone-formoterol  2 puff Inhalation BID   multivitamin with minerals  1 tablet Oral Daily   pantoprazole  40 mg Oral BID   rivaroxaban  20 mg Oral QHS   thiamine  100 mg Oral Daily   traZODone  100 mg Oral QHS   cyanocobalamin  1,000 mcg Oral Daily   Continuous Infusions:   LOS: 7 days   Geradine Girt  [facility]   How to contact the [facility] Attending or Consulting provider [place] or covering provider during after hours [place], for this patient?  1. Check the care team in [facility] and look for a) attending/consulting [place] provider listed and b)  the [facility] team listed 2. Log into [email or web address] and use [place]'s universal password to access. If you do not have the password, please contact the  hospital operator. 3. Locate the [facility] provider you are looking for under [facility] and page to a number that you can be directly reached. 4. If you still have difficulty reaching the provider, please page the [facility] (Director on Call) for the Hospitalists listed on amion for assistance.  02/28/2020, 3:02 PM

## 2020-02-29 DIAGNOSIS — I609 Nontraumatic subarachnoid hemorrhage, unspecified: Secondary | ICD-10-CM | POA: Diagnosis not present

## 2020-02-29 DIAGNOSIS — T50902A Poisoning by unspecified drugs, medicaments and biological substances, intentional self-harm, initial encounter: Secondary | ICD-10-CM | POA: Diagnosis not present

## 2020-02-29 DIAGNOSIS — C9110 Chronic lymphocytic leukemia of B-cell type not having achieved remission: Secondary | ICD-10-CM | POA: Diagnosis not present

## 2020-02-29 DIAGNOSIS — F332 Major depressive disorder, recurrent severe without psychotic features: Secondary | ICD-10-CM | POA: Diagnosis not present

## 2020-02-29 NOTE — Progress Notes (Signed)
PROGRESS NOTE    Bern Fare  GUR:[id number] DOB: 1956/05/19 DOA: 02/21/2020 PCP: Patient, No Pcp Per   Brief Narrative:  Patient was admitted with suicidal intent and presented with drug overdose with oxycodone and Restoril along with wrist lacerations.  Patient initially admitted by PCCM.  CT head was noted to have a small occipital intraparenchymal hemorrhage and subarachnoid extension.  Psychiatry consulted and recommended inpatient psych.  Patient now awaiting [facility] psych placement. Assessment & Plan  Non-aneurysmal small left occipital intraparenchymal hemorrhage with subarachnoid extension/ Acute toxic encephalopathy  -Secondary to drug overdose with oxycodone and Restoril along with trauma in the light of anticoagulant (Xarelto) use -Xarelto reversed with Kcentra in the ED -Neurosurgery consulted; appreciate their recommendations- resumed xarelto 10/11 -Repeat head CT stable -mental status stable  Chronic atrial fibrillation/flutter -Continue Xarelto, metoprolol  Suicidal attempt with bilateral wrist laceration and drug overdose -psych consult- needs geri-psych placement -suicide precautions -SW asked for psych re-consult-on 02/28/2020, continue to recommend inpatient psychiatry placement  Chronic diastolic heart failure -Currently appears to be euvolemic and compensated -Continue to monitor I's and O's, daily weight  Recent Massive PE -Continue Xarelto  CLL -WBCs stable  Obesity --BMI 31.33  patient will need to follow-up with his PCP on discharge and discuss lifestyle modifications  Hypokalemia -Resolved with replacement, continue to monitor BMP  Insomnia -Continue trazodone  DVT Prophylaxis SCDs/Xarelto  Code Status: Full  Family Communication: None at bedside  Disposition Plan:  Status is: Inpatient  Remains inpatient appropriate because:Unsafe d/c plan   Dispo: The patient is from: Home              Anticipated d/c is to: Geripsych               Anticipated d/c date is: > 3 days              Patient currently is medically stable to d/c.   Consultants PCCM Psychiatry  Procedures  None  Antibiotics   Anti-infectives (From admission, onward)   None      Subjective:   [name] seen and examined today.  Complains of nausea and acid reflux.  Denies chest pain or shortness of breath, dizziness or headache.  Objective:   Vitals:   02/29/20 0700 02/29/20 0722 02/29/20 1100 02/29/20 1139  BP: (!) 147/81  122/62 106/61  Pulse: 86  70 73  Resp: 20 16 18 17   Temp: 98.2 F (36.8 C)  98.6 F (37 C) 98.3 F (36.8 C)  TempSrc: Oral  Oral Oral  SpO2: 99%  97% 97%  Weight:      Height:        Intake/Output Summary (Last 24 hours) at 02/29/2020 1547 Last data filed at 02/29/2020 1412 Gross per 24 hour  Intake 1320 ml  Output 5650 ml  Net -4330 ml   Filed Weights   02/27/20 0300 02/28/20 0553 02/29/20 0437  Weight: 103.8 kg 104.8 kg 103 kg    Exam  General: Well developed, well nourished, NAD, appears stated age  15: NCAT, mucous membranes moist.   Cardiovascular: S1 S2 auscultated, RRR  Respiratory: Clear to auscultation bilaterally  Abdomen: Soft, nontender, nondistended, + bowel sounds  Extremities: warm dry without cyanosis clubbing or edema  Neuro: AAOx3, nonfocal  Psych: appropriate   Data Reviewed: I have personally reviewed following labs and imaging studies  CBC: Recent Labs  Lab 02/23/20 0545 02/24/20 0319  WBC 26.1* 26.7*  NEUTROABS 16.2* 10.7*  HGB 11.9* 11.4*  HCT 36.0*  35.2*  MCV 87.6 88.9  PLT 213 716   Basic Metabolic Panel: Recent Labs  Lab 02/23/20 0545 02/24/20 0319 02/26/20 0304 02/27/20 0758  NA 137 136 135 136  K 3.1* 3.3* 5.2* 4.1  CL 101 101 98 98  CO2 24 25 27 27   GLUCOSE 89 105* 122* 130*  BUN 13 13 17 20   CREATININE 1.07 1.07 1.17 1.27*  CALCIUM 8.8* 9.2 9.9 9.6   GFR: Estimated Creatinine Clearance: 72 mL/min (A) (by C-G formula  based on SCr of 1.27 mg/dL (H)). Liver Function Tests: Recent Labs  Lab 02/23/20 0545  AST 43*  ALT 24  ALKPHOS 60  BILITOT 1.0  PROT 6.4*  ALBUMIN 3.7   No results for input(s): LIPASE, AMYLASE in the last 168 hours. No results for input(s): AMMONIA in the last 168 hours. Coagulation Profile: No results for input(s): INR, PROTIME in the last 168 hours. Cardiac Enzymes: No results for input(s): CKTOTAL, CKMB, CKMBINDEX, TROPONINI in the last 168 hours. BNP (last 3 results) No results for input(s): PROBNP in the last 8760 hours. HbA1C: No results for input(s): HGBA1C in the last 72 hours. CBG: No results for input(s): GLUCAP in the last 168 hours. Lipid Profile: No results for input(s): CHOL, HDL, LDLCALC, TRIG, CHOLHDL, LDLDIRECT in the last 72 hours. Thyroid Function Tests: No results for input(s): TSH, T4TOTAL, FREET4, T3FREE, THYROIDAB in the last 72 hours. Anemia Panel: No results for input(s): VITAMINB12, FOLATE, FERRITIN, TIBC, IRON, RETICCTPCT in the last 72 hours. Urine analysis: No results found for: COLORURINE, APPEARANCEUR, LABSPEC, PHURINE, GLUCOSEU, HGBUR, BILIRUBINUR, KETONESUR, PROTEINUR, UROBILINOGEN, NITRITE, LEUKOCYTESUR Sepsis Labs: @LABRCNTIP (procalcitonin:4,lacticidven:4)  ) Recent Results (from the past 240 hour(s))  Respiratory Panel by RT PCR (Flu A&B, Covid) - Nasopharyngeal Swab     Status: None   Collection Time: 02/21/20  6:58 AM   Specimen: Nasopharyngeal Swab  Result Value Ref Range Status   SARS Coronavirus 2 by RT PCR NEGATIVE NEGATIVE Final    Comment: (NOTE) SARS-CoV-2 target nucleic acids are NOT DETECTED.  The SARS-CoV-2 RNA is generally detectable in upper respiratoy specimens during the acute phase of infection. The lowest concentration of SARS-CoV-2 viral copies this assay can detect is 131 copies/mL. A negative result does not preclude SARS-Cov-2 infection and should not be used as the sole basis for treatment or other patient  management decisions. A negative result may occur with  improper specimen collection/handling, submission of specimen other than nasopharyngeal swab, presence of viral mutation(s) within the areas targeted by this assay, and inadequate number of viral copies (<131 copies/mL). A negative result must be combined with clinical observations, patient history, and epidemiological information. The expected result is Negative.  Fact Sheet for Patients:  PinkCheek.be  Fact Sheet for Healthcare Providers:  GravelBags.it  This test is no t yet approved or cleared by the [place] FDA and  has been authorized for detection and/or diagnosis of SARS-CoV-2 by FDA under an Emergency Use Authorization (EUA). This EUA will remain  in effect (meaning this test can be used) for the duration of the COVID-19 declaration under Section 564(b)(1) of the Act, 21 U.S.C. section 360bbb-3(b)(1), unless the authorization is terminated or revoked sooner.     Influenza A by PCR NEGATIVE NEGATIVE Final   Influenza B by PCR NEGATIVE NEGATIVE Final    Comment: (NOTE) The Xpert Xpress SARS-CoV-2/FLU/RSV assay is intended as an aid in  the diagnosis of influenza from Nasopharyngeal swab specimens and  should not be used as a  sole basis for treatment. Nasal washings and  aspirates are unacceptable for Xpert Xpress SARS-CoV-2/FLU/RSV  testing.  Fact Sheet for Patients: PinkCheek.be  Fact Sheet for Healthcare Providers: GravelBags.it  This test is not yet approved or cleared by the [place] FDA and  has been authorized for detection and/or diagnosis of SARS-CoV-2 by  FDA under an Emergency Use Authorization (EUA). This EUA will remain  in effect (meaning this test can be used) for the duration of the  Covid-19 declaration under Section 564(b)(1) of the Act, 21  U.S.C. section 360bbb-3(b)(1),  unless the authorization is  terminated or revoked. Performed at [facility], [place] [address] [place], [place] [address]   MRSA PCR Screening     Status: None   Collection Time: 02/21/20  1:04 PM   Specimen: Nasal Mucosa; Nasopharyngeal  Result Value Ref Range Status   MRSA by PCR NEGATIVE NEGATIVE Final    Comment:        The GeneXpert MRSA Assay (FDA approved for NASAL specimens only), is one component of a comprehensive MRSA colonization surveillance program. It is not intended to diagnose MRSA infection nor to guide or monitor treatment for MRSA infections. Performed at [facility], [place] [address], [place], [place] [address]       Radiology Studies: No results found.   Scheduled Meds:  escitalopram  5 mg Oral Daily   famotidine  20 mg Oral BID   melatonin  3 mg Oral QHS   metoCLOPramide (REGLAN) injection  5 mg Intravenous Q6H   metoprolol tartrate  12.5 mg Oral BID   mirtazapine  15 mg Oral QHS   mometasone-formoterol  2 puff Inhalation BID   multivitamin with minerals  1 tablet Oral Daily   pantoprazole  40 mg Oral BID   rivaroxaban  20 mg Oral QHS   thiamine  100 mg Oral Daily   cyanocobalamin  1,000 mcg Oral Daily   Continuous Infusions:   LOS: 8 days   Time Spent in minutes   30 minutes  Jaloni Davoli D.O. on 02/29/2020 at 3:47 PM  Between 7am to 7pm - Please see pager noted on [email or web address]  After 7pm go to [email or web address]  And look for the night coverage person covering for me after hours  [facility] Office  [phone]

## 2020-02-29 NOTE — Progress Notes (Signed)
Nutrition Follow-up  DOCUMENTATION CODES:   Obesity unspecified  INTERVENTION:   -D/c Ensure Enlive po BID, each supplement provides 350 kcal and 20 grams of protein -Magic cup TID with meals, each supplement provides 290 kcal and 9 grams of protein -MVI with minerals daily  NUTRITION DIAGNOSIS:   Inadequate oral intake related to decreased appetite as evidenced by meal completion < 50%.  Progressing   GOAL:   Patient will meet greater than or equal to 90% of their needs  Progressing   MONITOR:   PO intake, Supplement acceptance, Weight trends, Skin, Labs, I & O's  REASON FOR ASSESSMENT:   Malnutrition Screening Tool    ASSESSMENT:   Pt admitted with AMS 2/2 intentional drug OD and head trauma after fall. Pt also slit both wrists. PMH includes A.fib, s/p ablation x2, restrictive lung disease, OSA, HTN, large PEs 11/2019 prompting NSTEMI, CLL, and h/o EtOH abuse. 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  Reviewed I/O's: -3.2 L x 24 hours and -14.1 L since admission  Pt resting quietly at time of visit; RD did not disturb.   [name] appetite has improved since last week. Noted meal completion 75-100%. Pt has been refusing Ensure supplements. RD will d/c in light of improved oral intake.   Per MD notes, pt is medically stable for discharge; awaiting geri psych placement.   Medications reviewed and include thiamine, vitamin B-12, reglan and remeron.   Labs reviewed.   Diet Order:   Diet Order            Diet regular Room service appropriate? Yes with Assist; Fluid consistency: Thin  Diet effective now                 EDUCATION NEEDS:   Not appropriate for education at this time  Skin:  Skin Assessment: Skin Integrity Issues: Skin Integrity Issues:: Other (Comment) Other: lacerations bilateral wrists  Last BM:  02/26/20  Height:   Ht Readings from Last 1 Encounters:  02/28/20 6' (1.829 m)    Weight:   Wt  Readings from Last 1 Encounters:  02/29/20 103 kg    Ideal Body Weight:  80.91 kg  BMI:  Body mass index is 30.79 kg/m.  Estimated Nutritional Needs:   Kcal:  2200-2400  Protein:  120-135 grams  Fluid:  > 2 L    Loistine Chance, RD, LDN, [place] Registered Dietitian II Certified Diabetes Care and Education Specialist Please refer to [facility] for RD and/or RD on-call/weekend/after hours pager

## 2020-03-01 DIAGNOSIS — I609 Nontraumatic subarachnoid hemorrhage, unspecified: Secondary | ICD-10-CM | POA: Diagnosis not present

## 2020-03-01 DIAGNOSIS — T50902A Poisoning by unspecified drugs, medicaments and biological substances, intentional self-harm, initial encounter: Secondary | ICD-10-CM | POA: Diagnosis not present

## 2020-03-01 DIAGNOSIS — F332 Major depressive disorder, recurrent severe without psychotic features: Secondary | ICD-10-CM | POA: Diagnosis not present

## 2020-03-01 DIAGNOSIS — C9110 Chronic lymphocytic leukemia of B-cell type not having achieved remission: Secondary | ICD-10-CM | POA: Diagnosis not present

## 2020-03-01 LAB — OPIATES,MS,WB/SP RFX
6-Acetylmorphine: NEGATIVE
Codeine: NEGATIVE ng/mL
Dihydrocodeine: NEGATIVE ng/mL
Hydrocodone: NEGATIVE ng/mL
Hydromorphone: NEGATIVE ng/mL
Morphine: NEGATIVE ng/mL
Opiate Confirmation: NEGATIVE

## 2020-03-01 LAB — DRUG SCREEN 10 W/CONF, SERUM
Amphetamines, IA: NEGATIVE ng/mL
Barbiturates, IA: NEGATIVE ug/mL
Benzodiazepines, IA: NEGATIVE ng/mL
Cocaine & Metabolite, IA: NEGATIVE ng/mL
Methadone, IA: NEGATIVE ng/mL
Opiates, IA: NEGATIVE ng/mL
Oxycodones, IA: POSITIVE ng/mL — AB
Phencyclidine, IA: NEGATIVE ng/mL
Propoxyphene, IA: NEGATIVE ng/mL
THC(Marijuana) Metabolite, IA: POSITIVE ng/mL — AB

## 2020-03-01 LAB — OXYCODONES,MS,WB/SP RFX
Oxycocone: 4.7 ng/mL
Oxycodones Confirmation: POSITIVE
Oxymorphone: NEGATIVE ng/mL

## 2020-03-01 NOTE — Progress Notes (Addendum)
PROGRESS NOTE    Ricci Dirocco  KCL:[id number] DOB: 29-Dec-1955 DOA: 02/21/2020 PCP: Patient, No Pcp Per   Brief Narrative:  Patient was admitted with suicidal intent and presented with drug overdose with oxycodone and Restoril along with wrist lacerations.  Patient initially admitted by PCCM.  CT head was noted to have a small occipital intraparenchymal hemorrhage and subarachnoid extension.  Psychiatry consulted and recommended inpatient psych.  Patient now awaiting [facility] psych placement. Assessment & Plan  Non-aneurysmal small left occipital intraparenchymal hemorrhage with subarachnoid extension/ Acute toxic encephalopathy  -Secondary to drug overdose with oxycodone and Restoril along with trauma in the light of anticoagulant (Xarelto) use -Xarelto reversed with Kcentra in the ED -Neurosurgery consulted; appreciate their recommendations- resumed xarelto 10/11 -Repeat head CT stable -mental status stable  Chronic atrial fibrillation/flutter -Continue Xarelto, metoprolol  Suicidal attempt with bilateral wrist laceration and drug overdose -psych consult- needs geri-psych placement -suicide precautions -SW asked for psych re-consult- Reconsulted on 02/28/2020, and continues to need inpatient psychiatry placement when medically stable  Chronic diastolic heart failure -Currently appears to be euvolemic and compensated -Continue to monitor I's and O's, daily weights  Recent Massive PE -Continue Xarelto  CLL -WBCs stable  Obesity -BMI 31.33  -Patient will need to follow-up with his PCP on discharge and discuss lifestyle modifications  Hypokalemia -Resolved with replacement, continue to monitor BMP intermittently   Insomnia -Continue trazodone  DVT Prophylaxis SCDs/Xarelto  Code Status: Full  Family Communication: None at bedside  Disposition Plan:  Status is: Inpatient  Remains inpatient appropriate because:Unsafe d/c plan   Dispo: The patient is from:  Home              Anticipated d/c is to: Geripsych              Anticipated d/c date is: > 3 days              Patient currently is medically stable to d/c.   Consultants PCCM Psychiatry  Procedures  None  Antibiotics   Anti-infectives (From admission, onward)   None      Subjective:   [name] seen and examined today.  Patient states that if he continues to take his Reglan and other medications for acid reflux that he feels better and denies any current nausea.  Denies any chest pain or shortness of breath at this time.  Denies dizziness or headache.    Objective:   Vitals:   02/29/20 2202 03/01/20 0616 03/01/20 0751 03/01/20 1012  BP: 132/69 129/85  108/64  Pulse: 85 84  87  Resp: 18 18 18 18   Temp: 98.6 F (37 C) 99 F (37.2 C)  98.4 F (36.9 C)  TempSrc: Oral Oral  Oral  SpO2: 97% 100%  99%  Weight:  102.9 kg    Height:        Intake/Output Summary (Last 24 hours) at 03/01/2020 1056 Last data filed at 03/01/2020 4944 Gross per 24 hour  Intake 1320 ml  Output 4400 ml  Net -3080 ml   Filed Weights   02/28/20 0553 02/29/20 0437 03/01/20 0616  Weight: 104.8 kg 103 kg 102.9 kg    Exam  General: Well developed, well nourished, NAD, appears stated age  HEENT: NCAT, mucous membranes moist.   Cardiovascular: S1 S2 auscultated, RRR  Respiratory: Clear to auscultation bilaterally  Abdomen: Soft, nontender, nondistended, + bowel sounds  Extremities: warm dry without cyanosis clubbing or edema  Neuro: AAOx3, nonfocal  Psych: appropriate mood and affect  Data Reviewed: I have personally reviewed following labs and imaging studies  CBC: Recent Labs  Lab 02/24/20 0319  WBC 26.7*  NEUTROABS 10.7*  HGB 11.4*  HCT 35.2*  MCV 88.9  PLT 263   Basic Metabolic Panel: Recent Labs  Lab 02/24/20 0319 02/26/20 0304 02/27/20 0758  NA 136 135 136  K 3.3* 5.2* 4.1  CL 101 98 98  CO2 25 27 27   GLUCOSE 105* 122* 130*  BUN 13 17 20     CREATININE 1.07 1.17 1.27*  CALCIUM 9.2 9.9 9.6   GFR: Estimated Creatinine Clearance: 71.9 mL/min (A) (by C-G formula based on SCr of 1.27 mg/dL (H)). Liver Function Tests: No results for input(s): AST, ALT, ALKPHOS, BILITOT, PROT, ALBUMIN in the last 168 hours. No results for input(s): LIPASE, AMYLASE in the last 168 hours. No results for input(s): AMMONIA in the last 168 hours. Coagulation Profile: No results for input(s): INR, PROTIME in the last 168 hours. Cardiac Enzymes: No results for input(s): CKTOTAL, CKMB, CKMBINDEX, TROPONINI in the last 168 hours. BNP (last 3 results) No results for input(s): PROBNP in the last 8760 hours. HbA1C: No results for input(s): HGBA1C in the last 72 hours. CBG: No results for input(s): GLUCAP in the last 168 hours. Lipid Profile: No results for input(s): CHOL, HDL, LDLCALC, TRIG, CHOLHDL, LDLDIRECT in the last 72 hours. Thyroid Function Tests: No results for input(s): TSH, T4TOTAL, FREET4, T3FREE, THYROIDAB in the last 72 hours. Anemia Panel: No results for input(s): VITAMINB12, FOLATE, FERRITIN, TIBC, IRON, RETICCTPCT in the last 72 hours. Urine analysis: No results found for: COLORURINE, APPEARANCEUR, LABSPEC, PHURINE, GLUCOSEU, HGBUR, BILIRUBINUR, KETONESUR, PROTEINUR, UROBILINOGEN, NITRITE, LEUKOCYTESUR Sepsis Labs: @LABRCNTIP (procalcitonin:4,lacticidven:4)  ) Recent Results (from the past 240 hour(s))  Respiratory Panel by RT PCR (Flu A&B, Covid) - Nasopharyngeal Swab     Status: None   Collection Time: 02/21/20  6:58 AM   Specimen: Nasopharyngeal Swab  Result Value Ref Range Status   SARS Coronavirus 2 by RT PCR NEGATIVE NEGATIVE Final    Comment: (NOTE) SARS-CoV-2 target nucleic acids are NOT DETECTED.  The SARS-CoV-2 RNA is generally detectable in upper respiratoy specimens during the acute phase of infection. The lowest concentration of SARS-CoV-2 viral copies this assay can detect is 131 copies/mL. A negative result does  not preclude SARS-Cov-2 infection and should not be used as the sole basis for treatment or other patient management decisions. A negative result may occur with  improper specimen collection/handling, submission of specimen other than nasopharyngeal swab, presence of viral mutation(s) within the areas targeted by this assay, and inadequate number of viral copies (<131 copies/mL). A negative result must be combined with clinical observations, patient history, and epidemiological information. The expected result is Negative.  Fact Sheet for Patients:  PinkCheek.be  Fact Sheet for Healthcare Providers:  GravelBags.it  This test is no t yet approved or cleared by the [place] FDA and  has been authorized for detection and/or diagnosis of SARS-CoV-2 by FDA under an Emergency Use Authorization (EUA). This EUA will remain  in effect (meaning this test can be used) for the duration of the COVID-19 declaration under Section 564(b)(1) of the Act, 21 U.S.C. section 360bbb-3(b)(1), unless the authorization is terminated or revoked sooner.     Influenza A by PCR NEGATIVE NEGATIVE Final   Influenza B by PCR NEGATIVE NEGATIVE Final    Comment: (NOTE) The Xpert Xpress SARS-CoV-2/FLU/RSV assay is intended as an aid in  the diagnosis of influenza from Nasopharyngeal swab specimens  and  should not be used as a sole basis for treatment. Nasal washings and  aspirates are unacceptable for Xpert Xpress SARS-CoV-2/FLU/RSV  testing.  Fact Sheet for Patients: PinkCheek.be  Fact Sheet for Healthcare Providers: GravelBags.it  This test is not yet approved or cleared by the [place] FDA and  has been authorized for detection and/or diagnosis of SARS-CoV-2 by  FDA under an Emergency Use Authorization (EUA). This EUA will remain  in effect (meaning this test can be used) for the  duration of the  Covid-19 declaration under Section 564(b)(1) of the Act, 21  U.S.C. section 360bbb-3(b)(1), unless the authorization is  terminated or revoked. Performed at [facility], [place] [address], [place], [place] [address]   MRSA PCR Screening     Status: None   Collection Time: 02/21/20  1:04 PM   Specimen: Nasal Mucosa; Nasopharyngeal  Result Value Ref Range Status   MRSA by PCR NEGATIVE NEGATIVE Final    Comment:        The GeneXpert MRSA Assay (FDA approved for NASAL specimens only), is one component of a comprehensive MRSA colonization surveillance program. It is not intended to diagnose MRSA infection nor to guide or monitor treatment for MRSA infections. Performed at [facility], [place] [address], [place], [place] [address]       Radiology Studies: No results found.   Scheduled Meds: . escitalopram  5 mg Oral Daily  . famotidine  20 mg Oral BID  . melatonin  3 mg Oral QHS  . metoCLOPramide (REGLAN) injection  5 mg Intravenous Q6H  . metoprolol tartrate  12.5 mg Oral BID  . mirtazapine  15 mg Oral QHS  . mometasone-formoterol  2 puff Inhalation BID  . multivitamin with minerals  1 tablet Oral Daily  . pantoprazole  40 mg Oral BID  . rivaroxaban  20 mg Oral QHS  . thiamine  100 mg Oral Daily  . cyanocobalamin  1,000 mcg Oral Daily   Continuous Infusions:   LOS: 9 days   Time Spent in minutes   15 minutes  Anesha Hackert D.O. on 03/01/2020 at 10:56 AM  Between 7am to 7pm - Please see pager noted on [email or web address]  After 7pm go to [email or web address]  And look for the night coverage person covering for me after hours  [facility] Office  [phone]

## 2020-03-01 NOTE — Progress Notes (Signed)
Pt went for a walk on the hallway. Pt was hoping to relieve some pain on his back. Pt stated that it felt the same before walking. RN notified of pt's pain. Will continue to monitor pt.

## 2020-03-01 NOTE — Progress Notes (Signed)
Pt.s lunch has arrived. Pt woken from nap, pt sitting up on chair and eating his lunch. Pt did ask for 2 cups of ice, walked down with writer to nurses station to receive 2 cups of ice. Pt calm and cooperative. Will continue to monitor pt.

## 2020-03-01 NOTE — Progress Notes (Signed)
Pt walked out in the hallway. Pt was calm and cooperative. Will continue to monitor pt.

## 2020-03-01 NOTE — Progress Notes (Signed)
Pt went for a walk out on the hallway, 4x's. Pt back in room, sitting on chair. Pt calm and cooperative. Will continue to monitor pt.

## 2020-03-01 NOTE — Progress Notes (Signed)
Pt went for a walk around the hallway, 2x's. Pt then stated that they did not feel well, they felt a little nausea. RN called to make aware. Will continue to monitor pt.

## 2020-03-01 NOTE — Progress Notes (Addendum)
CSW still trying to find geri-psych placement for pt. [facility] has no beds available, [facility] declined pt due to medical status. CSW spoke with pt's daughter and updated her on status. CSW reached out to [employer] and [facility], sent referrals by fax. CSW will continue search.

## 2020-03-01 NOTE — Progress Notes (Signed)
Pt.s dinner has arrived. Pt sitting up and eating his dinner. Will continue to monitor pt.

## 2020-03-01 NOTE — Progress Notes (Signed)
Pt.s breakfast has arrived. Pt sitting up on chair and now eating breakfast. Will continue to monitor pt.

## 2020-03-02 ENCOUNTER — Inpatient Hospital Stay (HOSPITAL_COMMUNITY): Payer: Medicare Other

## 2020-03-02 DIAGNOSIS — I609 Nontraumatic subarachnoid hemorrhage, unspecified: Secondary | ICD-10-CM | POA: Diagnosis not present

## 2020-03-02 DIAGNOSIS — C9110 Chronic lymphocytic leukemia of B-cell type not having achieved remission: Secondary | ICD-10-CM | POA: Diagnosis not present

## 2020-03-02 DIAGNOSIS — T50902A Poisoning by unspecified drugs, medicaments and biological substances, intentional self-harm, initial encounter: Secondary | ICD-10-CM | POA: Diagnosis not present

## 2020-03-02 DIAGNOSIS — F332 Major depressive disorder, recurrent severe without psychotic features: Secondary | ICD-10-CM | POA: Diagnosis not present

## 2020-03-02 LAB — BASIC METABOLIC PANEL
Anion gap: 8 (ref 5–15)
BUN: 19 mg/dL (ref 8–23)
CO2: 27 mmol/L (ref 22–32)
Calcium: 9.3 mg/dL (ref 8.9–10.3)
Chloride: 102 mmol/L (ref 98–111)
Creatinine, Ser: 1.24 mg/dL (ref 0.61–1.24)
GFR, Estimated: >60 mL/min (ref >60)
Glucose, Bld: 99 mg/dL (ref 70–99)
Potassium: 4.1 mmol/L (ref 3.5–5.1)
Sodium: 137 mmol/L (ref 135–145)

## 2020-03-02 LAB — HEPATIC FUNCTION PANEL
ALT: 16 U/L (ref 0–44)
AST: 21 U/L (ref 15–41)
Albumin: 3.8 g/dL (ref 3.5–5.0)
Alkaline Phosphatase: 60 U/L (ref 38–126)
Bilirubin, Direct: <0.1 mg/dL (ref 0.0–0.2)
Total Bilirubin: 0.8 mg/dL (ref 0.3–1.2)
Total Protein: 6.4 g/dL — ABNORMAL LOW (ref 6.5–8.1)

## 2020-03-02 MED ORDER — METOCLOPRAMIDE HCL 5 MG PO TABS
5.0000 mg | ORAL_TABLET | Freq: Four times a day (QID) | ORAL | Status: DC
  Administered 2020-03-02 – 2020-03-05 (×14): 5 mg via ORAL
  Filled 2020-03-02 (×14): qty 1

## 2020-03-02 NOTE — Progress Notes (Addendum)
CSW spoke with Stanton Kidney at [facility] (Psych) [phone], stated that there are beds available but they would need updated labs, EKG, chest xray, and psych consult (within 72 hours). CSW still able to use pt UA and drug screen from earlier. DC RN sent message to MD to reorder labs and psych consult. CSW will continue to follow and fax documentation to [phone] when available.

## 2020-03-02 NOTE — Progress Notes (Signed)
PROGRESS NOTE    [facility] Oneill  ERD:[id number] DOB: 12-20-1955 DOA: 02/21/2020 PCP: Patient, No Pcp Per   Brief Narrative:  Patient was admitted with suicidal intent and presented with drug overdose with oxycodone and Restoril along with wrist lacerations.  Patient initially admitted by PCCM.  CT head was noted to have a small occipital intraparenchymal hemorrhage and subarachnoid extension.  Psychiatry consulted and recommended inpatient psych.  Patient now awaiting [facility] psych placement. Assessment & Plan  Non-aneurysmal small left occipital intraparenchymal hemorrhage with subarachnoid extension/ Acute toxic encephalopathy  -Secondary to drug overdose with oxycodone and Restoril along with trauma in the light of anticoagulant (Xarelto) use -Xarelto reversed with Kcentra in the ED -Neurosurgery consulted; appreciate their recommendations- resumed xarelto 10/11 -Repeat head CT stable -mental status stable  Chronic atrial fibrillation/flutter -Continue Xarelto, metoprolol  Suicidal attempt with bilateral wrist laceration and drug overdose -psych consult- needs geri-psych placement -suicide precautions -SW asked for psych re-consult- Reconsulted on 02/28/2020, and continues to need inpatient psychiatry placement when medically stable -discussed with TOC on 10/14- still searching for geripsych  Chronic diastolic heart failure -Currently appears to be euvolemic and compensated -Continue to monitor I's and O's, daily weights  Recent Massive PE -Continue Xarelto  CLL -WBCs stable  Obesity -BMI 31.33  -Patient will need to follow-up with his PCP on discharge and discuss lifestyle modifications  Hypokalemia -Resolved, WNL today -continue to monitor BMP intermittently   Insomnia -Continue trazodone  DVT Prophylaxis SCDs/Xarelto  Code Status: Full  Family Communication: None at bedside  Disposition Plan:  Status is: Inpatient  Remains inpatient appropriate  because:Unsafe d/c plan   Dispo: The patient is from: Home              Anticipated d/c is to: Geripsych              Anticipated d/c date is: > 3 days              Patient currently is medically stable to d/c.   Consultants PCCM Psychiatry  Procedures  None  Antibiotics   Anti-infectives (From admission, onward)   None      Subjective:   David Oneill seen and examined today.  No complaints today.  Seems patient is very tired today and wanting to sleep.    Objective:   Vitals:   03/01/20 1702 03/01/20 2203 03/02/20 0504 03/02/20 0520  BP: 119/64 137/64  136/73  Pulse: 67 91  75  Resp: 18 18  18   Temp: 99 F (37.2 C) 98.4 F (36.9 C)  99 F (37.2 C)  TempSrc: Oral Oral  Oral  SpO2: 100% 99%  100%  Weight:   104 kg   Height:        Intake/Output Summary (Last 24 hours) at 03/02/2020 1235 Last data filed at 03/02/2020 0233 Gross per 24 hour  Intake 240 ml  Output 1675 ml  Net -1435 ml   Filed Weights   02/29/20 0437 03/01/20 0616 03/02/20 0504  Weight: 103 kg 102.9 kg 104 kg   Exam  General: Well developed, well nourished, NAD, appears stated age  HEENT: NCAT, mucous membranes moist.   Cardiovascular: S1 S2 auscultated, RRR  Extremities: warm dry without cyanosis clubbing or edema  Neuro: AAOx3, nonfocal  Psych: Appropriate  Data Reviewed: I have personally reviewed following labs and imaging studies  CBC: No results for input(s): WBC, NEUTROABS, HGB, HCT, MCV, PLT in the last 168 hours. Basic Metabolic Panel: Recent Labs  Lab 02/26/20  0304 02/27/20 0758 03/02/20 0356  NA 135 136 137  K 5.2* 4.1 4.1  CL 98 98 102  CO2 27 27 27   GLUCOSE 122* 130* 99  BUN 17 20 19   CREATININE 1.17 1.27* 1.24  CALCIUM 9.9 9.6 9.3   GFR: Estimated Creatinine Clearance: 74.1 mL/min (by C-G formula based on SCr of 1.24 mg/dL). Liver Function Tests: No results for input(s): AST, ALT, ALKPHOS, BILITOT, PROT, ALBUMIN in the last 168 hours. No results  for input(s): LIPASE, AMYLASE in the last 168 hours. No results for input(s): AMMONIA in the last 168 hours. Coagulation Profile: No results for input(s): INR, PROTIME in the last 168 hours. Cardiac Enzymes: No results for input(s): CKTOTAL, CKMB, CKMBINDEX, TROPONINI in the last 168 hours. BNP (last 3 results) No results for input(s): PROBNP in the last 8760 hours. HbA1C: No results for input(s): HGBA1C in the last 72 hours. CBG: No results for input(s): GLUCAP in the last 168 hours. Lipid Profile: No results for input(s): CHOL, HDL, LDLCALC, TRIG, CHOLHDL, LDLDIRECT in the last 72 hours. Thyroid Function Tests: No results for input(s): TSH, T4TOTAL, FREET4, T3FREE, THYROIDAB in the last 72 hours. Anemia Panel: No results for input(s): VITAMINB12, FOLATE, FERRITIN, TIBC, IRON, RETICCTPCT in the last 72 hours. Urine analysis: No results found for: COLORURINE, APPEARANCEUR, LABSPEC, [place], GLUCOSEU, HGBUR, BILIRUBINUR, [place], PROTEINUR, UROBILINOGEN, NITRITE, LEUKOCYTESUR Sepsis Labs: @LABRCNTIP (procalcitonin:4,lacticidven:4)  ) Recent Results (from the past 240 hour(s))  MRSA PCR Screening     Status: None   Collection Time: 02/21/20  1:04 PM   Specimen: Nasal Mucosa; Nasopharyngeal  Result Value Ref Range Status   MRSA by PCR NEGATIVE NEGATIVE Final    Comment:        The GeneXpert MRSA Assay (FDA approved for NASAL specimens only), is one component of a comprehensive MRSA colonization surveillance program. It is not intended to diagnose MRSA infection nor to guide or monitor treatment for MRSA infections. Performed at [facility], [place] [address], [place], [place] [address]       Radiology Studies: No results found.   Scheduled Meds: . escitalopram  5 mg Oral Daily  . famotidine  20 mg Oral BID  . melatonin  3 mg Oral QHS  . metoCLOPramide (REGLAN) injection  5 mg Intravenous Q6H  . metoprolol tartrate  12.5 mg Oral BID  . mirtazapine  15 mg Oral  QHS  . mometasone-formoterol  2 puff Inhalation BID  . multivitamin with minerals  1 tablet Oral Daily  . pantoprazole  40 mg Oral BID  . rivaroxaban  20 mg Oral QHS  . thiamine  100 mg Oral Daily  . cyanocobalamin  1,000 mcg Oral Daily   Continuous Infusions:   LOS: 10 days   Time Spent in minutes   15 minutes  Adaya Garmany D.O. on 03/02/2020 at 12:35 PM  Between 7am to 7pm - Please see pager noted on [email or web address]  After 7pm go to [email or web address]  And look for the night coverage person covering for me after hours  [facility] Office  [phone]

## 2020-03-02 NOTE — Progress Notes (Signed)
CSW received a call from pt daughter Rubin Payor, she wanted to know the status of pt placement and wanted to know if the pt was able to have visitors, she was inquiring for her brother. CSW asked dc RN if pt was able to have visitors, dc RN checked and stated pt was able to have visitors. CSW advised pt's daughter that pt's son could come and visit pt. CSW sent pt's daughter hospital address and pt room number to pt's daughter via text.

## 2020-03-03 DIAGNOSIS — I609 Nontraumatic subarachnoid hemorrhage, unspecified: Secondary | ICD-10-CM | POA: Diagnosis not present

## 2020-03-03 DIAGNOSIS — C9110 Chronic lymphocytic leukemia of B-cell type not having achieved remission: Secondary | ICD-10-CM | POA: Diagnosis not present

## 2020-03-03 DIAGNOSIS — T50902A Poisoning by unspecified drugs, medicaments and biological substances, intentional self-harm, initial encounter: Secondary | ICD-10-CM | POA: Diagnosis not present

## 2020-03-03 DIAGNOSIS — F332 Major depressive disorder, recurrent severe without psychotic features: Secondary | ICD-10-CM | POA: Diagnosis not present

## 2020-03-03 LAB — CBC
HCT: 38.3 % — ABNORMAL LOW (ref 39.0–52.0)
Hemoglobin: 12 g/dL — ABNORMAL LOW (ref 13.0–17.0)
MCH: 28.2 pg (ref 26.0–34.0)
MCHC: 31.3 g/dL (ref 30.0–36.0)
MCV: 90.1 fL (ref 80.0–100.0)
Platelets: 301 10*3/uL (ref 150–400)
RBC: 4.25 MIL/uL (ref 4.22–5.81)
RDW: 15.5 % (ref 11.5–15.5)
WBC: 20.4 10*3/uL — ABNORMAL HIGH (ref 4.0–10.5)
nRBC: 0 % (ref 0.0–0.2)

## 2020-03-03 MED ORDER — TRAZODONE HCL 50 MG PO TABS: 50.0000 mg | ORAL_TABLET | Freq: Every day | ORAL | Status: DC

## 2020-03-03 MED ORDER — RAMELTEON 8 MG PO TABS
8.0000 mg | ORAL_TABLET | Freq: Every day | ORAL | Status: DC
  Administered 2020-03-03 – 2020-03-26 (×24): 8 mg via ORAL
  Filled 2020-03-03 (×26): qty 1

## 2020-03-03 NOTE — Progress Notes (Signed)
PROGRESS NOTE    David Oneill  KGM:[id number] DOB: 05-Jul-1955 DOA: 02/21/2020 PCP: Patient, No Pcp Per   Brief Narrative:  Patient was admitted with suicidal intent and presented with drug overdose with oxycodone and Restoril along with wrist lacerations.  Patient initially admitted by PCCM.  CT head was noted to have a small occipital intraparenchymal hemorrhage and subarachnoid extension.  Psychiatry consulted and recommended inpatient psych.  Patient now awaiting [facility] psych placement. Assessment & Plan  Non-aneurysmal small left occipital intraparenchymal hemorrhage with subarachnoid extension/ Acute toxic encephalopathy  -Secondary to drug overdose with oxycodone and Restoril along with trauma in the light of anticoagulant (Xarelto) use -Xarelto reversed with Kcentra in the ED -Neurosurgery consulted; appreciate their recommendations- resumed xarelto 10/11 -Repeat head CT stable -mental status stable  Chronic atrial fibrillation/flutter -Continue Xarelto, metoprolol  Suicidal attempt with bilateral wrist laceration and drug overdose -psych consult- needs geri-psych placement -suicide precautions -SW asked for psych re-consult- Reconsulted on 02/28/2020, and continues to need inpatient psychiatry placement when medically stable -discussed with TOC on 10/14- still searching for geripsych -Have ordered a repeat chest x-ray which was unremarkable for infection.  CBC is stable.  CMP appears to be within normal limits.  EKG pending. -Discussed with psychiatry, they will reevaluate the patient again.   Chronic diastolic heart failure -Currently appears to be euvolemic and compensated -Continue to monitor I's and O's, daily weights  Recent Massive PE -Continue Xarelto  CLL -WBCs stable  Obesity -BMI 31.33  -Patient will need to follow-up with his PCP on discharge and discuss lifestyle modifications  Hypokalemia -Resolved, WNL today -continue to monitor BMP  intermittently   Insomnia -Continue trazodone  DVT Prophylaxis SCDs/Xarelto  Code Status: Full  Family Communication: None at bedside  Disposition Plan:  Status is: Inpatient  Remains inpatient appropriate because:Unsafe d/c plan   Dispo: The patient is from: Home              Anticipated d/c is to: Geripsych              Anticipated d/c date is: > 3 days              Patient currently is medically stable to d/c.   Consultants PCCM Psychiatry  Procedures  None  Antibiotics   Anti-infectives (From admission, onward)   None      Subjective:   David Oneill seen and examined today.  Patient with no complaints today.  Denies chest pain, shortness breath, abdominal pain, nausea or vomiting, diarrhea or constipation.  Feels acid reflux is improved  Objective:   Vitals:   03/02/20 0520 03/02/20 1955 03/02/20 2153 03/03/20 1217  BP: 136/73  123/79 119/78  Pulse: 75  94 72  Resp: 18  20 18   Temp: 99 F (37.2 C)  98.6 F (37 C) 97.8 F (36.6 C)  TempSrc: Oral  Oral Oral  SpO2: 100% 99% 96% 99%  Weight:      Height:        Intake/Output Summary (Last 24 hours) at 03/03/2020 1238 Last data filed at 03/02/2020 2330 Gross per 24 hour  Intake 240 ml  Output 1300 ml  Net -1060 ml   Filed Weights   02/29/20 0437 03/01/20 0616 03/02/20 0504  Weight: 103 kg 102.9 kg 104 kg   Exam  General: Well developed, well nourished, NAD, appears stated age  31: NCAT, mucous membranes moist.  Extremities: warm dry without cyanosis clubbing or edema  Neuro: AAOx3, nonfocal   Data  Reviewed: I have personally reviewed following labs and imaging studies  CBC: Recent Labs  Lab 03/03/20 0209  WBC 20.4*  HGB 12.0*  HCT 38.3*  MCV 90.1  PLT 579   Basic Metabolic Panel: Recent Labs  Lab 02/26/20 0304 02/27/20 0758 03/02/20 0356  NA 135 136 137  K 5.2* 4.1 4.1  CL 98 98 102  CO2 27 27 27   GLUCOSE 122* 130* 99  BUN 17 20 19   CREATININE 1.17 1.27* 1.24   CALCIUM 9.9 9.6 9.3   GFR: Estimated Creatinine Clearance: 74.1 mL/min (by C-G formula based on SCr of 1.24 mg/dL). Liver Function Tests: Recent Labs  Lab 03/02/20 0356  AST 21  ALT 16  ALKPHOS 60  BILITOT 0.8  PROT 6.4*  ALBUMIN 3.8   No results for input(s): LIPASE, AMYLASE in the last 168 hours. No results for input(s): AMMONIA in the last 168 hours. Coagulation Profile: No results for input(s): INR, PROTIME in the last 168 hours. Cardiac Enzymes: No results for input(s): CKTOTAL, CKMB, CKMBINDEX, TROPONINI in the last 168 hours. BNP (last 3 results) No results for input(s): PROBNP in the last 8760 hours. HbA1C: No results for input(s): HGBA1C in the last 72 hours. CBG: No results for input(s): GLUCAP in the last 168 hours. Lipid Profile: No results for input(s): CHOL, HDL, LDLCALC, TRIG, CHOLHDL, LDLDIRECT in the last 72 hours. Thyroid Function Tests: No results for input(s): TSH, T4TOTAL, FREET4, T3FREE, THYROIDAB in the last 72 hours. Anemia Panel: No results for input(s): VITAMINB12, FOLATE, FERRITIN, TIBC, IRON, RETICCTPCT in the last 72 hours. Urine analysis: No results found for: COLORURINE, APPEARANCEUR, LABSPEC, PHURINE, GLUCOSEU, HGBUR, BILIRUBINUR, KETONESUR, PROTEINUR, UROBILINOGEN, NITRITE, LEUKOCYTESUR Sepsis Labs: @LABRCNTIP (procalcitonin:4,lacticidven:4)  ) No results found for this or any previous visit (from the past 240 hour(s)).    Radiology Studies: DG CHEST PORT 1 VIEW  Result Date: 03/02/2020 CLINICAL DATA:  Shortness of breath. History of congestive heart failure. EXAM: PORTABLE CHEST 1 VIEW COMPARISON:  Single-view of the chest 02/22/2020 and 02/21/2020. FINDINGS: Lungs clear. Heart size upper normal. No pneumothorax or pleural effusion. No acute or focal bony abnormality. IMPRESSION: No acute disease. Electronically Signed   By: Inge Rise M.D.   On: 03/02/2020 15:39     Scheduled Meds: . escitalopram  5 mg Oral Daily  .  famotidine  20 mg Oral BID  . melatonin  3 mg Oral QHS  . metoCLOPramide  5 mg Oral Q6H  . metoprolol tartrate  12.5 mg Oral BID  . mirtazapine  15 mg Oral QHS  . mometasone-formoterol  2 puff Inhalation BID  . multivitamin with minerals  1 tablet Oral Daily  . pantoprazole  40 mg Oral BID  . rivaroxaban  20 mg Oral QHS  . thiamine  100 mg Oral Daily  . cyanocobalamin  1,000 mcg Oral Daily   Continuous Infusions:   LOS: 11 days   Time Spent in minutes   15 minutes  Glenisha Gundry D.O. on 03/03/2020 at 12:38 PM  Between 7am to 7pm - Please see pager noted on [email or web address]  After 7pm go to [email or web address]  And look for the night coverage person covering for me after hours  [facility] Office  [phone]

## 2020-03-03 NOTE — Consult Note (Signed)
[facility] Psychiatry Consult   Reason for Consult:  Overdose  Referring Physician:  Dr Ree Kida Patient Identification: David Oneill MRN:  [id number] Principal Diagnosis: CVA Diagnosis:  Active Problems:   Intentional drug overdose ([place])   Major depressive disorder, recurrent severe without psychotic features ([place])   SAH (subarachnoid hemorrhage) ([place])   Suicide attempt ([place])   CLL (chronic lymphocytic leukemia) ([place])   Total Time spent with patient: 45 minutes  Subjective:  Patient continues to meet criteria for inpatient psychiatric hospitalization, overdosed and slit his wrist--requiring sutures.  He is also not sleeping well at night despite the melatonin and Remeron.  These were discontinued and started on Rozerem 8 mg at bedtime, well tolerated in the elderly population.  Trazodone was tried with no relief either.  Minimize daytime sleeping to assist with regular night time, sleep patterns.  White, older adult who will be living alone--high risk factors for suicide.    02/28/20: David Oneill is a 64 y.o. male patient admitted with intentional overdose. Patient states that he has been feeling "extremely nauseous" today and reports that he "just wants this nausea to go away". He states that he is in the hospital related to 2 Zanaflex that he took to help him sleep and that he woke up in his daughter's front yard and "does not know what all happened that night", but denies that he was attempting to end his life. Minimizes his suicide attempt of an overdose with opiates and Restoril requiring Narcan twice to revive.  Also, cut his wrists requiring sutures.  Inconsistent historian based on previous notes.  He reports that he currently lives with his daughter but has been told that he is not able to return to stay with her after this hospital discharge. Patient states that this may be due to an incident that occurred shortly after he was vaccinated with his first dose of the COVID-19  vaccine and the next day he had "blood clots and a heart attack" and is now unable to drive. He reports that his daughter wanted him to live with her and her family and that he "gave her all of my money so she could build a house". He reports that she became angry with him after he bought CBD oil at the farmer's market to help with his nausea. Patient has PMH significant for depression. Continues to meet inpatient psychiatric hospitalization as he remains impulsive and wants to "know what's wrong with my head."    HPI per MD: 64 year old male with suicidal/homicidal intent, presented after drug overdose with oxycodone and Restoril and was noted to have both a wrist lacerations.  CT head was noted with small occipital intraparenchymal hemorrhage with subarachnoid extension.  Past Psychiatric History: depression  Risk to Self:  yes Risk to Others:  none Prior Inpatient Therapy:  denies Prior Outpatient Therapy:  none  Past Medical History:  Past Medical History:  Diagnosis Date  . Chronic back pain   . Suicide attempt ([place]) 02/22/2020    Past Surgical History:  Procedure Laterality Date  . ABLATION OF DYSRHYTHMIC FOCUS     Family History: History reviewed. No pertinent family history. Family Psychiatric  History: none Social History:  Social History   Substance and Sexual Activity  Alcohol Use Not Currently     Social History   Substance and Sexual Activity  Drug Use Yes  . Types: Marijuana    Social History   Socioeconomic History  . Marital status: Single    Spouse name:  Not on file  . Number of children: Not on file  . Years of education: Not on file  . Highest education level: Not on file  Occupational History  . Not on file  Tobacco Use  . Smoking status: Never Smoker  . Smokeless tobacco: Never Used  Vaping Use  . Vaping Use: Never used  Substance and Sexual Activity  . Alcohol use: Not Currently  . Drug use: Yes    Types: Marijuana  . Sexual activity: Not  on file  Other Topics Concern  . Not on file  Social History Narrative  . Not on file   Social Determinants of Health   Financial Resource Strain:   . Difficulty of Paying Living Expenses: Not on file  Food Insecurity:   . Worried About Charity fundraiser in the Last Year: Not on file  . Ran Out of Food in the Last Year: Not on file  Transportation Needs:   . Lack of Transportation (Medical): Not on file  . Lack of Transportation (Non-Medical): Not on file  Physical Activity:   . Days of Exercise per Week: Not on file  . Minutes of Exercise per Session: Not on file  Stress:   . Feeling of Stress : Not on file  Social Connections:   . Frequency of Communication with Friends and Family: Not on file  . Frequency of Social Gatherings with Friends and Family: Not on file  . Attends Religious Services: Not on file  . Active Member of Clubs or Organizations: Not on file  . Attends Archivist Meetings: Not on file  . Marital Status: Not on file   Additional Social History:    Allergies:  Not on File  Labs:  Results for orders placed or performed during the hospital encounter of 02/21/20 (from the past 48 hour(s))  Basic metabolic panel     Status: None   Collection Time: 03/02/20  3:56 AM  Result Value Ref Range   Sodium 137 135 - 145 mmol/L   Potassium 4.1 3.5 - 5.1 mmol/L   Chloride 102 98 - 111 mmol/L   CO2 27 22 - 32 mmol/L   Glucose, Bld 99 70 - 99 mg/dL    Comment: Glucose reference range applies only to samples taken after fasting for at least 8 hours.   BUN 19 8 - 23 mg/dL   Creatinine, Ser 1.24 0.61 - 1.24 mg/dL   Calcium 9.3 8.9 - 10.3 mg/dL   GFR, Estimated >60 >60 mL/min   Anion gap 8 5 - 15    Comment: Performed at [facility] [address], [place], [place] [address]  Hepatic function panel     Status: Abnormal   Collection Time: 03/02/20  3:56 AM  Result Value Ref Range   Total Protein 6.4 (L) 6.5 - 8.1 g/dL   Albumin 3.8 3.5 - 5.0  g/dL   AST 21 15 - 41 U/L   ALT 16 0 - 44 U/L   Alkaline Phosphatase 60 38 - 126 U/L   Total Bilirubin 0.8 0.3 - 1.2 mg/dL   Bilirubin, Direct <0.1 0.0 - 0.2 mg/dL   Indirect Bilirubin NOT CALCULATED 0.3 - 0.9 mg/dL    Comment: Performed at [facility] [address] [place], [place] [address]  CBC     Status: Abnormal   Collection Time: 03/03/20  2:09 AM  Result Value Ref Range   WBC 20.4 (H) 4.0 - 10.5 K/uL  RBC 4.25 4.22 - 5.81 MIL/uL   Hemoglobin 12.0 (L) 13.0 - 17.0 g/dL   HCT 38.3 (L) 39 - 52 %   MCV 90.1 80.0 - 100.0 fL   MCH 28.2 26.0 - 34.0 pg   MCHC 31.3 30.0 - 36.0 g/dL   RDW 15.5 11.5 - 15.5 %   Platelets 301 150 - 400 K/uL   nRBC 0.0 0.0 - 0.2 %    Comment: Performed at [facility] [address] [place], [place] [address]    Current Facility-Administered Medications  Medication Dose Route Frequency Provider Last Rate Last Admin  . acetaminophen (TYLENOL) tablet 650 mg  650 mg Oral Q6H PRN Jose Persia, MD   650 mg at 03/03/20 0817  . albuterol (PROVENTIL) (2.5 MG/3ML) 0.083% nebulizer solution 2.5 mg  2.5 mg Nebulization Q2H PRN Magdalen Spatz, NP      . docusate sodium (COLACE) capsule 100 mg  100 mg Oral BID PRN Magdalen Spatz, NP   100 mg at 02/27/20 2106  . escitalopram (LEXAPRO) tablet 5 mg  5 mg Oral Daily Eulogio Bear U, DO   5 mg at 03/03/20 0817  . famotidine (PEPCID) tablet 20 mg  20 mg Oral BID Eulogio Bear U, DO   20 mg at 03/03/20 0818  . fluticasone (FLONASE) 50 MCG/ACT nasal spray 2 spray  2 spray Each Nare Daily PRN Eulogio Bear U, DO   2 spray at 03/02/20 2328  . melatonin tablet 3 mg  3 mg Oral QHS Vann, Jessica U, DO   3 mg at 03/02/20 2322  . metoCLOPramide (REGLAN) tablet 5 mg  5 mg Oral Q6H Mikhail, [place], DO   5 mg at 03/03/20 1211  . metoprolol tartrate (LOPRESSOR) tablet 12.5 mg  12.5 mg Oral BID Eulogio Bear U, DO   12.5 mg at 03/03/20 0818  . mirtazapine (REMERON SOL-TAB) disintegrating tablet 15 mg  15 mg Oral  QHS Vann, Jessica U, DO   15 mg at 03/02/20 2323  . mometasone-formoterol (DULERA) 100-5 MCG/ACT inhaler 2 puff  2 puff Inhalation BID Jose Persia, MD   2 puff at 03/02/20 1955  . multivitamin with minerals tablet 1 tablet  1 tablet Oral Daily Jacky Kindle, MD   1 tablet at 03/03/20 0818  . naloxone [facility]) injection 0.4 mg  0.4 mg Intravenous PRN Simonne Maffucci B, MD   0.4 mg at 02/21/20 2202  . ondansetron (ZOFRAN) injection 4 mg  4 mg Intravenous Q6H PRN Magdalen Spatz, NP   4 mg at 03/03/20 0157  . pantoprazole (PROTONIX) EC tablet 40 mg  40 mg Oral BID Eulogio Bear U, DO   40 mg at 03/03/20 0817  . polyethylene glycol (MIRALAX / GLYCOLAX) packet 17 g  17 g Oral Daily PRN Magdalen Spatz, NP   17 g at 03/03/20 0818  . rivaroxaban (XARELTO) tablet 20 mg  20 mg Oral QHS Vann, Jessica U, DO   20 mg at 03/02/20 2322  . thiamine tablet 100 mg  100 mg Oral Daily Eulogio Bear U, DO   100 mg at 03/03/20 0818  . vitamin B-12 (CYANOCOBALAMIN) tablet 1,000 mcg  1,000 mcg Oral Daily Eulogio Bear U, DO   1,000 mcg at 03/03/20 0818    Musculoskeletal: Strength & Muscle Tone: increased Gait & Station: normal Patient leans: N/A  Psychiatric Specialty Exam: Physical Exam Vitals and nursing note reviewed.  Constitutional:      Appearance: Normal appearance.  HENT:  Head: Normocephalic.     Nose: Nose normal.  Pulmonary:     Effort: Pulmonary effort is normal.  Musculoskeletal:        General: Normal range of motion.     Cervical back: Normal range of motion.  Neurological:     General: No focal deficit present.     Mental Status: He is alert and oriented to person, place, and time.  Psychiatric:        Attention and Perception: Perception normal.        Mood and Affect: Mood is anxious and depressed.        Speech: Speech normal.        Behavior: Behavior is agitated.        Thought Content: Thought content includes suicidal ideation. Thought content includes suicidal plan.         Cognition and Memory: Cognition and memory normal.        Judgment: Judgment is impulsive.     Review of Systems  Gastrointestinal: Positive for nausea.  Psychiatric/Behavioral: Positive for agitation, dysphoric mood, self-injury and suicidal ideas. The patient is nervous/anxious.   All other systems reviewed and are negative.   Blood pressure 119/78, pulse 72, temperature 97.8 F (36.6 C), temperature source Oral, resp. rate 18, height 6' (1.829 m), weight 104 kg, SpO2 99 %.Body mass index is 31.1 kg/m.  General Appearance: Disheveled  Eye Contact:  Fair  Speech:  Normal Rate  Volume:  Normal  Mood:  Anxious and Depressed  Affect:  Congruent  Thought Process:  Coherent and Descriptions of Associations: Intact  Orientation:  Full (Time, Place, and Person)  Thought Content:  Rumination  Suicidal Thoughts:  Yes.  with intent/plan  Homicidal Thoughts:  No  Memory:  Immediate;   Fair Recent;   Fair Remote;   Fair  Judgement:  Poor  Insight:  Lacking  Psychomotor Activity:  Increased  Concentration:  Concentration: Fair and Attention Span: Fair  Recall:  [employer] of Knowledge:  Fair  Language:  Good  Akathisia:  No  Handed:  Right  AIMS (if indicated):     Assets:  Leisure Time Resilience Social Support  ADL's:  Intact  Cognition:  WNL  Sleep:        Treatment Plan Summary: Major depressive disorder, recurrent, severe without psychosis: -continue Lexapro 5 mg daily  Insomnia - D/C Remeron and Melatonin -Start Rozerem 8 mg at bedtime  Disposition: Recommend psychiatric Inpatient admission when medically cleared.  Waylan Boga, NP 03/03/2020 1:53 PM

## 2020-03-04 DIAGNOSIS — F332 Major depressive disorder, recurrent severe without psychotic features: Secondary | ICD-10-CM | POA: Diagnosis not present

## 2020-03-04 DIAGNOSIS — I609 Nontraumatic subarachnoid hemorrhage, unspecified: Secondary | ICD-10-CM | POA: Diagnosis not present

## 2020-03-04 DIAGNOSIS — C9110 Chronic lymphocytic leukemia of B-cell type not having achieved remission: Secondary | ICD-10-CM | POA: Diagnosis not present

## 2020-03-04 DIAGNOSIS — T50902A Poisoning by unspecified drugs, medicaments and biological substances, intentional self-harm, initial encounter: Secondary | ICD-10-CM | POA: Diagnosis not present

## 2020-03-04 LAB — BASIC METABOLIC PANEL
Anion gap: 11 (ref 5–15)
BUN: 17 mg/dL (ref 8–23)
CO2: 25 mmol/L (ref 22–32)
Calcium: 9.3 mg/dL (ref 8.9–10.3)
Chloride: 101 mmol/L (ref 98–111)
Creatinine, Ser: 1.24 mg/dL (ref 0.61–1.24)
GFR, Estimated: >60 mL/min (ref >60)
Glucose, Bld: 117 mg/dL — ABNORMAL HIGH (ref 70–99)
Potassium: 3.9 mmol/L (ref 3.5–5.1)
Sodium: 137 mmol/L (ref 135–145)

## 2020-03-04 LAB — MAGNESIUM: Magnesium: 2.2 mg/dL (ref 1.7–2.4)

## 2020-03-04 LAB — HEMOGLOBIN AND HEMATOCRIT, BLOOD
HCT: 39.4 % (ref 39.0–52.0)
Hemoglobin: 12.4 g/dL — ABNORMAL LOW (ref 13.0–17.0)

## 2020-03-04 NOTE — Progress Notes (Signed)
Pt still c/o of nausea. Reglan and Zofran given. MD informed. No vomiting, ate all of his lunch.

## 2020-03-04 NOTE — Progress Notes (Signed)
PROGRESS NOTE    David Oneill  ZOX:[id number] DOB: 02/03/1956 DOA: 02/21/2020 PCP: Patient, No Pcp Per   Brief Narrative:  Patient was admitted with suicidal intent and presented with drug overdose with oxycodone and Restoril along with wrist lacerations.  Patient initially admitted by PCCM.  CT head was noted to have a small occipital intraparenchymal hemorrhage and subarachnoid extension.  Psychiatry consulted and recommended inpatient psych.  Patient now awaiting [facility] psych placement. Assessment & Plan   Non-aneurysmal small left occipital intraparenchymal hemorrhage with subarachnoid extension/ Acute toxic encephalopathy  -Secondary to drug overdose with oxycodone and Restoril along with trauma in the light of anticoagulant (Xarelto) use -Xarelto reversed with Kcentra in the ED -Neurosurgery consulted; appreciate their recommendations- resumed xarelto 10/11 -Repeat head CT stable -mental status stable  Chronic atrial fibrillation/flutter -Continue Xarelto, metoprolol  Suicidal attempt with bilateral wrist laceration and drug overdose -psych consult- needs geri-psych placement -suicide precautions -SW asked for psych re-consult- Reconsulted on 02/28/2020, and continues to need inpatient psychiatry placement when medically stable -discussed with TOC on 10/14- still searching for geripsych -Have ordered a repeat chest x-ray which was unremarkable for infection.  CBC is stable.  CMP appears to be within normal limits.  EKG still pending. 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  Recommending continuing Lexapro.  Remeron and melatonin were discontinued.  Rozerem was started at bedtime.  Chronic diastolic heart failure -Currently appears to be euvolemic and compensated -Continue to monitor I's and O's, daily weights  Recent Massive PE -Continue Xarelto  CLL -WBCs  stable  Obesity -BMI 31.33  -Patient will need to follow-up with his PCP on discharge and discuss lifestyle modifications  Hypokalemia -Resolved, WNL today -continue to monitor BMP intermittently   Insomnia -Continue trazodone  DVT Prophylaxis SCDs/Xarelto  Code Status: Full  Family Communication: None at bedside  Disposition Plan:  Status is: Inpatient  Remains inpatient appropriate because:Unsafe d/c plan   Dispo: The patient is from: Home              Anticipated d/c is to: Geripsych              Anticipated d/c date is: > 3 days              Patient currently is medically stable to d/c.   Consultants PCCM Psychiatry  Procedures  None  Antibiotics   Anti-infectives (From admission, onward)   None      Subjective:   David Oneill seen and examined today.  No complaints today.  States he has been sleeping well.  Denies current chest pain or shortness of breath, abdominal pain, dizziness or headache. Objective:   Vitals:   03/03/20 2348 03/04/20 0045 03/04/20 0857 03/04/20 0858  BP: 134/84   (!) 118/98  Pulse: 81   68  Resp: 16   19  Temp: 98.3 F (36.8 C)   97.9 F (36.6 C)  TempSrc: Oral   Oral  SpO2: 98%  95% 98%  Weight:  104.6 kg    Height:        Intake/Output Summary (Last 24 hours) at 03/04/2020 1101 Last data filed at 03/04/2020 0915 Gross per 24 hour  Intake 1320 ml  Output 4400 ml  Net -3080 ml   Filed Weights   03/01/20 0616 03/02/20 0504 03/04/20 0045  Weight: 102.9 kg 104 kg 104.6 kg   Exam  General: Well developed, well nourished, NAD,  appears stated age  64: NCAT, mucous membranes moist.   Neuro: AAOx3, nonfocal   Psych: pleasant and appropriate   Data Reviewed: I have personally reviewed following labs and imaging studies  CBC: Recent Labs  Lab 03/03/20 0209 03/04/20 0816  WBC 20.4*  --   HGB 12.0* 12.4*  HCT 38.3* 39.4  MCV 90.1  --   PLT 301  --    Basic Metabolic Panel: Recent Labs  Lab  02/27/20 0758 03/02/20 0356 03/04/20 0816  NA 136 137 137  K 4.1 4.1 3.9  CL 98 102 101  CO2 27 27 25   GLUCOSE 130* 99 117*  BUN 20 19 17   CREATININE 1.27* 1.24 1.24  CALCIUM 9.6 9.3 9.3  MG  --   --  2.2   GFR: Estimated Creatinine Clearance: 74.3 mL/min (by C-G formula based on SCr of 1.24 mg/dL). Liver Function Tests: Recent Labs  Lab 03/02/20 0356  AST 21  ALT 16  ALKPHOS 60  BILITOT 0.8  PROT 6.4*  ALBUMIN 3.8   No results for input(s): LIPASE, AMYLASE in the last 168 hours. No results for input(s): AMMONIA in the last 168 hours. Coagulation Profile: No results for input(s): INR, PROTIME in the last 168 hours. Cardiac Enzymes: No results for input(s): CKTOTAL, CKMB, CKMBINDEX, TROPONINI in the last 168 hours. BNP (last 3 results) No results for input(s): PROBNP in the last 8760 hours. HbA1C: No results for input(s): HGBA1C in the last 72 hours. CBG: No results for input(s): GLUCAP in the last 168 hours. Lipid Profile: No results for input(s): CHOL, HDL, LDLCALC, TRIG, CHOLHDL, LDLDIRECT in the last 72 hours. Thyroid Function Tests: No results for input(s): TSH, T4TOTAL, FREET4, T3FREE, THYROIDAB in the last 72 hours. Anemia Panel: No results for input(s): VITAMINB12, FOLATE, FERRITIN, TIBC, IRON, RETICCTPCT in the last 72 hours. Urine analysis: No results found for: COLORURINE, APPEARANCEUR, LABSPEC, PHURINE, GLUCOSEU, HGBUR, BILIRUBINUR, KETONESUR, PROTEINUR, UROBILINOGEN, NITRITE, LEUKOCYTESUR Sepsis Labs: @LABRCNTIP (procalcitonin:4,lacticidven:4)  ) No results found for this or any previous visit (from the past 240 hour(s)).    Radiology Studies: DG CHEST PORT 1 VIEW  Result Date: 03/02/2020 CLINICAL DATA:  Shortness of breath. History of congestive heart failure. EXAM: PORTABLE CHEST 1 VIEW COMPARISON:  Single-view of the chest 02/22/2020 and 02/21/2020. FINDINGS: Lungs clear. Heart size upper normal. No pneumothorax or pleural effusion. No acute or  focal bony abnormality. IMPRESSION: No acute disease. Electronically Signed   By: Inge Rise M.D.   On: 03/02/2020 15:39     Scheduled Meds: . escitalopram  5 mg Oral Daily  . famotidine  20 mg Oral BID  . metoCLOPramide  5 mg Oral Q6H  . metoprolol tartrate  12.5 mg Oral BID  . mometasone-formoterol  2 puff Inhalation BID  . multivitamin with minerals  1 tablet Oral Daily  . pantoprazole  40 mg Oral BID  . ramelteon  8 mg Oral QHS  . rivaroxaban  20 mg Oral QHS  . thiamine  100 mg Oral Daily  . cyanocobalamin  1,000 mcg Oral Daily   Continuous Infusions:   LOS: 12 days   Time Spent in minutes   15 minutes  Rontae Inglett D.O. on 03/04/2020 at 11:01 AM  Between 7am to 7pm - Please see pager noted on [email or web address]  After 7pm go to [email or web address]  And look for the night coverage person covering for me after hours  [facility] Office  [phone]

## 2020-03-05 DIAGNOSIS — Z7901 Long term (current) use of anticoagulants: Secondary | ICD-10-CM | POA: Diagnosis not present

## 2020-03-05 DIAGNOSIS — R112 Nausea with vomiting, unspecified: Secondary | ICD-10-CM | POA: Diagnosis not present

## 2020-03-05 DIAGNOSIS — T50902A Poisoning by unspecified drugs, medicaments and biological substances, intentional self-harm, initial encounter: Secondary | ICD-10-CM | POA: Diagnosis not present

## 2020-03-05 DIAGNOSIS — I609 Nontraumatic subarachnoid hemorrhage, unspecified: Secondary | ICD-10-CM | POA: Diagnosis not present

## 2020-03-05 DIAGNOSIS — F332 Major depressive disorder, recurrent severe without psychotic features: Secondary | ICD-10-CM | POA: Diagnosis not present

## 2020-03-05 DIAGNOSIS — C9110 Chronic lymphocytic leukemia of B-cell type not having achieved remission: Secondary | ICD-10-CM | POA: Diagnosis not present

## 2020-03-05 NOTE — Progress Notes (Signed)
PROGRESS NOTE    David Oneill  EGB:[id number] DOB: 1955-11-26 DOA: 02/21/2020 PCP: Patient, No Pcp Per   Brief Narrative:  Patient was admitted with suicidal intent and presented with drug overdose with oxycodone and Restoril along with wrist lacerations.  Patient initially admitted by PCCM.  CT head was noted to have a small occipital intraparenchymal hemorrhage and subarachnoid extension.  Psychiatry consulted and recommended inpatient psych.  Patient now awaiting [facility] psych placement. Assessment & Plan   Non-aneurysmal small left occipital intraparenchymal hemorrhage with subarachnoid extension/ Acute toxic encephalopathy  -Secondary to drug overdose with oxycodone and Restoril along with trauma in the light of anticoagulant (Xarelto) use -Xarelto reversed with Kcentra in the ED -Neurosurgery consulted; appreciate their recommendations- resumed xarelto 10/11 -Repeat head CT stable -mental status stable  Chronic atrial fibrillation/flutter -Continue Xarelto, metoprolol  Suicidal attempt with bilateral wrist laceration and drug overdose -psych consult- needs geri-psych placement -suicide precautions -SW asked for psych re-consult- Reconsulted on 02/28/2020, and continues to need inpatient psychiatry placement when medically stable -discussed with TOC on 10/14- still searching for geripsych -Have ordered a repeat chest x-ray which was unremarkable for infection.  CBC is stable.  CMP appears to be within normal limits.  EKG showed AF, no acute changes. 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  Recommending continuing Lexapro.  Remeron and melatonin were discontinued.  Rozerem was started at bedtime.  Chronic diastolic heart failure -Currently appears to be euvolemic and compensated -Continue to monitor I's and O's, daily weights  Recent Massive PE -Continue  Xarelto  CLL -WBCs stable  Obesity -BMI 31.33  -Patient will need to follow-up with his PCP on discharge and discuss lifestyle modifications  Hypokalemia -Resolved, WNL today -continue to monitor BMP intermittently   Insomnia -Continue trazodone  Nausea -Patient continues to complain of nausea without vomiting (states no vomiting since last week) -currently on reglan and PRN antiemetics -Currently able to tolerate his diet -Has not had an endoscopy and states he had a colonoscopy a couple of years ago at [facility] -Would like to see gastroenterology while he is here -Gastroenterology consulted and appreciated   DVT Prophylaxis SCDs/Xarelto  Code Status: Full  Family Communication: None at bedside  Disposition Plan:  Status is: Inpatient  Remains inpatient appropriate because:Unsafe d/c plan   Dispo: The patient is from: Home              Anticipated d/c is to: Geripsych              Anticipated d/c date is: > 3 days              Patient currently is medically stable to d/c.   Consultants PCCM Psychiatry Gastroenterology  Procedures  None  Antibiotics   Anti-infectives (From admission, onward)   None      Subjective:   David Oneill seen and examined today.  Continues to have nausea but denies vomiting. Denies current chest pain, shortness of breath, abdominal pain, dizziness, headache. Feels he is sleeping better.  Objective:   Vitals:   03/04/20 1959 03/04/20 2035 03/05/20 0524 03/05/20 0810  BP: (!) 146/69  (!) 154/74 (!) 135/96  Pulse: 66  78 78  Resp: 18  18 20   Temp: 98.6 F (37 C)  98 F (36.7 C)   TempSrc: Oral  Oral   SpO2: 100% 99% 97% 100%  Weight:   103.7 kg   Height:  Intake/Output Summary (Last 24 hours) at 03/05/2020 0940 Last data filed at 03/05/2020 0901 Gross per 24 hour  Intake 2460 ml  Output 3250 ml  Net -790 ml   Filed Weights   03/02/20 0504 03/04/20 0045 03/05/20 0524  Weight: 104 kg 104.6 kg 103.7 kg    Exam  General: Well developed, well nourished, NAD, appears stated age  17: NCAT, mucous membranes moist.   Neuro: AAOx3, nonfocal   Psych: pleasant and appropriate   Data Reviewed: I have personally reviewed following labs and imaging studies  CBC: Recent Labs  Lab 03/03/20 0209 03/04/20 0816  WBC 20.4*  --   HGB 12.0* 12.4*  HCT 38.3* 39.4  MCV 90.1  --   PLT 301  --    Basic Metabolic Panel: Recent Labs  Lab 03/02/20 0356 03/04/20 0816  NA 137 137  K 4.1 3.9  CL 102 101  CO2 27 25  GLUCOSE 99 117*  BUN 19 17  CREATININE 1.24 1.24  CALCIUM 9.3 9.3  MG  --  2.2   GFR: Estimated Creatinine Clearance: 73.9 mL/min (by C-G formula based on SCr of 1.24 mg/dL). Liver Function Tests: Recent Labs  Lab 03/02/20 0356  AST 21  ALT 16  ALKPHOS 60  BILITOT 0.8  PROT 6.4*  ALBUMIN 3.8   No results for input(s): LIPASE, AMYLASE in the last 168 hours. No results for input(s): AMMONIA in the last 168 hours. Coagulation Profile: No results for input(s): INR, PROTIME in the last 168 hours. Cardiac Enzymes: No results for input(s): CKTOTAL, CKMB, CKMBINDEX, TROPONINI in the last 168 hours. BNP (last 3 results) No results for input(s): PROBNP in the last 8760 hours. HbA1C: No results for input(s): HGBA1C in the last 72 hours. CBG: No results for input(s): GLUCAP in the last 168 hours. Lipid Profile: No results for input(s): CHOL, HDL, LDLCALC, TRIG, CHOLHDL, LDLDIRECT in the last 72 hours. Thyroid Function Tests: No results for input(s): TSH, T4TOTAL, FREET4, T3FREE, THYROIDAB in the last 72 hours. Anemia Panel: No results for input(s): VITAMINB12, FOLATE, FERRITIN, TIBC, IRON, RETICCTPCT in the last 72 hours. Urine analysis: No results found for: COLORURINE, APPEARANCEUR, LABSPEC, PHURINE, GLUCOSEU, HGBUR, BILIRUBINUR, KETONESUR, PROTEINUR, UROBILINOGEN, NITRITE, LEUKOCYTESUR Sepsis Labs: @LABRCNTIP (procalcitonin:4,lacticidven:4)  ) No results found for  this or any previous visit (from the past 240 hour(s)).    Radiology Studies: No results found.   Scheduled Meds: . escitalopram  5 mg Oral Daily  . famotidine  20 mg Oral BID  . metoCLOPramide  5 mg Oral Q6H  . metoprolol tartrate  12.5 mg Oral BID  . mometasone-formoterol  2 puff Inhalation BID  . multivitamin with minerals  1 tablet Oral Daily  . pantoprazole  40 mg Oral BID  . ramelteon  8 mg Oral QHS  . rivaroxaban  20 mg Oral QHS  . thiamine  100 mg Oral Daily  . cyanocobalamin  1,000 mcg Oral Daily   Continuous Infusions:   LOS: 13 days   Time Spent in minutes   15 minutes  Sosaia Pittinger D.O. on 03/05/2020 at 9:40 AM  Between 7am to 7pm - Please see pager noted on [email or web address]  After 7pm go to [email or web address]  And look for the night coverage person covering for me after hours  [facility] Office  [phone]

## 2020-03-05 NOTE — H&P (View-Only) (Signed)
Referring Provider:  [facility]         Primary Care Physician:  Patient, No Pcp Per Primary Gastroenterologist: [facility]            We were asked to see this patient for:    nausea              ASSESSMENT / PLAN:   # Chronic nausea / vomiting --Started late June / early July. Nothing on CT scan in July or this admission to explain nausea.   --Symptoms predate starting narcotics at home and he continued to have symptoms even when not on pain medications.  --Other than known hematoma, no masses on head CT scan --For further evaluation will probably need EGD ( on Xarelto). He has eaten today.  --Continue PPI --Currently getting Reglan. Given his psych history Zofran may be better choice.   # CLL, WBC stable  # PE, on Xarelto since July  # Suicide attempt, this admission --Awaiting placment   HPI:                                                                                                                             Chief Complaint: nausea  David Oneill is a 64 y.o. male with a pmh significant for, not necessarily limited to: CLL, CAD/ NSTEMI July 2021, PAF, HTN, hyperlipidemia, PE asthma, sleep apnea, diverticulosis  Patient was hospitalized twice at [facility] over the summer with a PE and NSTEMI and then AKI / hypotension from volume depletion and polypharmacy.    Patient admitted to [facility] 10/5 for suicide attempt by taking Zanaflex and slitting his wrist. He had a subarachnoid hemorrhoid, Neurosurgery evaluated, Kcentra given. Follow up head CT stable.  Xarelto resumed 02/27/20. Psych has evaluated, awaiting geri-psych placement.   Patient began having nausea with vomiting in late June. He says there was mention of getting an EGD while hospitalized at [facility] over the summer with PE / NSTEMI but it wasn't done. He has continued to have daily episodes of nausea and vomiting. He lost 70 pounds ( fruits and vegetables super food supplements ) but that was prior  to the onset of nausea and vomiting. His weight has been stable over last several months. The vomiting is random. If vomits after eating the emesis contains food, otherwise it is generally fluid. He doesn't vomit food eaten several hours prior. No hematemesis or blood in stool. No significant NSAID use. Cannot related onset of vomiting with any of his home medications. He hasn't been taking ani-emetics at home. He was taking narcotics at home but nausea / vomiting predated those.   PREVIOUS ENDOSCOPIC EVALUATIONS / [place] STUDIES   Feb 2021 Screening colonoscopy  02/20/20 CT scan abd / pelvis with and without contrast [facility] for abdominal pain  ABDOMEN/PELVIS:  Liver: Hepatomegaly.  Gallbladder/biliary: Within normal limits.  Spleen: Within normal limits.  Pancreas: Within normal limits.  Adrenals: Within normal limits.  Kidneys: No  hydronephrosis. No radiodense renal calculi. Bilateral subcentimeter hypodense lesions, too small to characterize.  Peritoneum/mesentery/extraperitoneum: Interval increase in size of multiple lymph nodes throughout the abdomen and pelvis, with index nodes as follows: paraspinal node measuring 1.1 cm (series 5/image 17), multiple enlarged mesenteric nodes, the largest jejunal node measuring 1.6 cm (series 5/image 70), retroperitoneal lymph nodes measuring 1.4 cm in short axis (series 3/image 65; series 3/image 73), common iliac nodal conglomerate measuring 2.0 x 1.9 cm (series 5/image 114), and bilateral external iliac lymph nodes, with deep index nodes measuring 1.4 cm on the right (series 5/image 150) and 1.3 cm on the left (series 5/image 148). Interval increase in size of multiple bilateral pelvic sidewall lymph nodes as well.  Gastrointestinal tract: Scattered diverticula of the sigmoid and descending colon without evidence of superimposed diverticulitis. Hyperdense material in the large bowel.  Ureters: Within normal limits.  Bladder: Within normal limits.    Reproductive System: Within normal limits.  Vascular: Mixed atherosclerosis of the aortoiliac system.  MSK: No acute osseous or soft tissue findings.   11/27/19 CTA abd at [facility] for abdominal pain  ABDOMEN  Liver: Scattered subcentimeter hypodensities are too small to characterize. The liver is enlarged, measuring 22 cm in craniocaudal dimension.  Gallbladder/biliary: Within normal limits.  Spleen: Within normal limits.  Pancreas: Within normal limits.  Adrenals: Within normal limits.  Kidneys: Symmetric enhancement. No hydronephrosis or nephrolithiasis. Bilateral subcentimetercortical hypodensities are too small to characterize.  Peritoneum: Within normal limits.  Extraperitoneum: Similar borderline enlarged small bowel mesentery and retroperitoneal lymph nodes, unchanged from yesterday's exam.  Gastrointestinal tract: No abnormally dilated bowel loops. No suspicious bowel wall thickening or enhancement. Extensive colonic diverticulosis without adjacent inflammatory changes. The appendix is normal.  Ureters: Within normal limits.  Bladder: Distended with contrast material. There is mild diffuse bladder wall thickening.  Reproductive system: Within normal limits.   VASCULAR  Aorta: Scattered aortoiliac atherosclerotic calcifications.  Celiac trunk and branches: Within normal limits.  Superior mesenteric trunk and branches:Within normal limits.  Inferior mesenteric trunk and branches: Within normal limits.  Renal arteries: Within normal limits.  Iliac arteries: Within normal limits.  Venous circulation: Within normal limits.    11/27/19 [place] for elevated bilirubin - [facility] US ABDOMEN LIMITED, 11/27/2019 5:25 PM   INDICATION: elevated t bili, looking for gallstones, biliary duct dilation \ R10.84 Abdominal pain, acute, generalized  ADDITIONAL HISTORY: None.  COMPARISON: CT abdomen pelvis 11/26/2019   TECHNIQUE: Multi-planar real-time ultrasonography of the upper abdomen  (right upper quadrant) using grayscale imaging, supplemented by color, power, and spectral Doppler as needed.   FINDINGS:   . Vascular: The inferior vena cava is patent. The visualized aorta is unremarkable.  . Pancreas: Incompletely imaged. Visualized portions are unremarkable.  . Liver: The liver measures 20 cm in its craniocaudal dimension. Increased size with normal echotexture. No focal lesions. The portal and hepatic venous systems are patent with antegrade flow.  . Gallbladder: No gallstones or sludge. No gallbladder wall thickening or pericholecystic fluid. Negative sonographic Murphy's sign.  . Biliary: The maximum caliber of the visualized common bile duct is 1.4 mm. No intrahepatic or extrahepatic ductal dilatation.  . Right kidney: The right kidney measures 11.2 cm in length. Normal size, contour, and echogenicity. The left kidney measures 12.3 cm. Normal size contour and echogenicity. No hydronephrosis or perinephric fluid. No focal mass is identified.  . Peritoneum: No ascites.  . Additional comments: None.    Past Medical History:  Diagnosis Date  . Chronic  back pain   . Suicide attempt ([place]) 02/22/2020    Past Surgical History:  Procedure Laterality Date  . ABLATION OF DYSRHYTHMIC FOCUS      Prior to Admission medications   Medication Sig Start Date End Date Taking? Authorizing Provider  acetaminophen (TYLENOL) 325 MG tablet Take 325 mg by mouth every 4 (four) hours as needed for mild pain.  01/01/20   [provider]  albuterol (VENTOLIN HFA) 108 (90 Base) MCG/ACT inhaler Inhale 1-2 puffs into the lungs every 6 (six) hours as needed for wheezing or shortness of breath.  08/24/19   [provider]  aspirin 81 MG EC tablet Take 81 mg by mouth daily.  12/07/19   [provider]  budesonide-formoterol (SYMBICORT) 160-4.5 MCG/ACT inhaler Inhale 2 puffs into the lungs daily.  01/01/20 03/31/20  [provider]  cyanocobalamin 1000 MCG  tablet Take 1,000 mcg by mouth daily.  07/09/17   [provider]  DULoxetine (CYMBALTA) 60 MG capsule Take 60 mg by mouth daily.  10/27/19   [provider]  fluticasone (FLONASE) 50 MCG/ACT nasal spray Place 2 sprays into both nostrils daily as needed for allergies.  06/17/18   [provider]  melatonin 3 MG TABS tablet Take 3 mg by mouth. 12/07/19   [provider]  metoprolol tartrate (LOPRESSOR) 25 MG tablet Take 12.5 mg by mouth in the morning and at bedtime. 01/01/20   [provider]  oxyCODONE (OXY IR/ROXICODONE) 5 MG immediate release tablet Take 10 mg by mouth every 8 (eight) hours as needed for moderate pain.     [provider]  pantoprazole (PROTONIX) 40 MG tablet Take 40 mg by mouth daily.  12/07/19   [provider]  promethazine (PHENERGAN) 25 MG tablet Take 25 mg by mouth every 6 (six) hours as needed for nausea.  12/27/19   [provider]  rOPINIRole (REQUIP) 1 MG tablet Take 2 mg by mouth at bedtime.  10/02/19   [provider]  rosuvastatin (CRESTOR) 40 MG tablet Take 40 mg by mouth daily.  12/07/19   [provider]  thiamine 500 MG tablet Take 1,000 mg by mouth daily.  02/09/19   [provider]  tiZANidine (ZANAFLEX) 4 MG tablet Take 4-8 mg by mouth every 8 (eight) hours as needed for muscle spasms.  02/01/20   [provider]  XARELTO 20 MG TABS tablet Take 20 mg by mouth at bedtime. 12/29/19   [provider]    Current Facility-Administered Medications  Medication Dose Route Frequency Provider Last Rate Last Admin  . acetaminophen (TYLENOL) tablet 650 mg  650 mg Oral Q6H PRN Jose Persia, MD   650 mg at 03/04/20 2122  . albuterol (PROVENTIL) (2.5 MG/3ML) 0.083% nebulizer solution 2.5 mg  2.5 mg Nebulization Q2H PRN Magdalen Spatz, NP      . docusate sodium (COLACE) capsule 100 mg  100 mg Oral BID PRN Magdalen Spatz, NP   100 mg at 03/04/20 0913  . escitalopram  (LEXAPRO) tablet 5 mg  5 mg Oral Daily Eulogio Bear U, DO   5 mg at 03/05/20 0811  . famotidine (PEPCID) tablet 20 mg  20 mg Oral BID Eulogio Bear U, DO   20 mg at 03/05/20 3220  . fluticasone (FLONASE) 50 MCG/ACT nasal spray 2 spray  2 spray Each Nare Daily PRN Eulogio Bear U, DO   2 spray at 03/03/20 2142  . metoCLOPramide (REGLAN) tablet 5 mg  5  mg Oral Q6H Mikhail, [place], DO   5 mg at 03/05/20 0541  . metoprolol tartrate (LOPRESSOR) tablet 12.5 mg  12.5 mg Oral BID Eulogio Bear U, DO   12.5 mg at 03/05/20 1025  . mometasone-formoterol (DULERA) 100-5 MCG/ACT inhaler 2 puff  2 puff Inhalation BID Jose Persia, MD   2 puff at 03/05/20 0803  . multivitamin with minerals tablet 1 tablet  1 tablet Oral Daily Jacky Kindle, MD   1 tablet at 03/05/20 0812  . naloxone [facility]) injection 0.4 mg  0.4 mg Intravenous PRN Simonne Maffucci B, MD   0.4 mg at 02/21/20 2202  . ondansetron (ZOFRAN) injection 4 mg  4 mg Intravenous Q6H PRN Magdalen Spatz, NP   4 mg at 03/04/20 1026  . pantoprazole (PROTONIX) EC tablet 40 mg  40 mg Oral BID Eulogio Bear U, DO   40 mg at 03/05/20 8527  . polyethylene glycol (MIRALAX / GLYCOLAX) packet 17 g  17 g Oral Daily PRN Magdalen Spatz, NP   17 g at 03/03/20 0818  . ramelteon (ROZEREM) tablet 8 mg  8 mg Oral QHS Patrecia Pour, NP   8 mg at 03/04/20 2109  . rivaroxaban (XARELTO) tablet 20 mg  20 mg Oral QHS Vann, Jessica U, DO   20 mg at 03/04/20 2109  . thiamine tablet 100 mg  100 mg Oral Daily Eulogio Bear U, DO   100 mg at 03/05/20 7824  . vitamin B-12 (CYANOCOBALAMIN) tablet 1,000 mcg  1,000 mcg Oral Daily Eulogio Bear U, DO   1,000 mcg at 03/05/20 2353    Allergies as of 02/21/2020  . (Not on File)    History reviewed. No pertinent family history.  Social History   Socioeconomic History  . Marital status: Single    Spouse name: Not on file  . Number of children: Not on file  . Years of education: Not on file  . Highest education level: Not on file   Occupational History  . Not on file  Tobacco Use  . Smoking status: Never Smoker  . Smokeless tobacco: Never Used  Vaping Use  . Vaping Use: Never used  Substance and Sexual Activity  . Alcohol use: Not Currently  . Drug use: Yes    Types: Marijuana  . Sexual activity: Not on file  Other Topics Concern  . Not on file  Social History Narrative  . Not on file   Social Determinants of Health   Financial Resource Strain:   . Difficulty of Paying Living Expenses: Not on file  Food Insecurity:   . Worried About Charity fundraiser in the Last Year: Not on file  . Ran Out of Food in the Last Year: Not on file  Transportation Needs:   . Lack of Transportation (Medical): Not on file  . Lack of Transportation (Non-Medical): Not on file  Physical Activity:   . Days of Exercise per Week: Not on file  . Minutes of Exercise per Session: Not on file  Stress:   . Feeling of Stress : Not on file  Social Connections:   . Frequency of Communication with Friends and Family: Not on file  . Frequency of Social Gatherings with Friends and Family: Not on file  . Attends Religious Services: Not on file  . Active Member of Clubs or Organizations: Not on file  . Attends Archivist Meetings: Not on file  . Marital Status: Not on file  Intimate Partner Violence:   .  Fear of Current or Ex-Partner: Not on file  . Emotionally Abused: Not on file  . Physically Abused: Not on file  . Sexually Abused: Not on file    Review of Systems: All systems reviewed and negative except where noted in HPI.   OBJECTIVE:    Physical Exam: Vital signs in last 24 hours: Temp:  [98 F (36.7 C)-98.6 F (37 C)] 98 F (36.7 C) (10/18 0524) Pulse Rate:  [66-78] 78 (10/18 0810) Resp:  [18-20] 20 (10/18 0810) BP: (126-154)/(69-96) 135/96 (10/18 0810) SpO2:  [96 %-100 %] 100 % (10/18 0810) Weight:  [103.7 kg] 103.7 kg (10/18 0524) Last BM Date: 03/04/20 General:   Alert, well-developed male in  NAD Psych:  Pleasant, cooperative. Normal mood and affect. Eyes:  Pupils equal, sclera clear, no icterus.   Conjunctiva pink. Ears:  Normal auditory acuity. Nose:  No deformity, discharge,  or lesions. Neck:  Supple; no masses Lungs:  Clear throughout to auscultation.   No wheezes, crackles, or rhonchi.  Heart:  Regular rate and rhythm; no murmurs, no lower extremity edema Abdomen:  Soft, non-distended, mild LUQ tenderness. BS active, no palp mass   Rectal:  Deferred  Msk:  Symmetrical without gross deformities. . Neurologic:  Alert and  oriented x4;  grossly normal neurologically. Skin:  Intact without significant lesions or rashes.  Filed Weights   03/02/20 0504 03/04/20 0045 03/05/20 0524  Weight: 104 kg 104.6 kg 103.7 kg     Scheduled inpatient medications . escitalopram  5 mg Oral Daily  . famotidine  20 mg Oral BID  . metoCLOPramide  5 mg Oral Q6H  . metoprolol tartrate  12.5 mg Oral BID  . mometasone-formoterol  2 puff Inhalation BID  . multivitamin with minerals  1 tablet Oral Daily  . pantoprazole  40 mg Oral BID  . ramelteon  8 mg Oral QHS  . rivaroxaban  20 mg Oral QHS  . thiamine  100 mg Oral Daily  . cyanocobalamin  1,000 mcg Oral Daily      Intake/Output from previous day: 10/17 0701 - 10/18 0700 In: 3000 [P.O.:3000] Out: 3350 [Urine:3350] Intake/Output this shift: Total I/O In: 540 [P.O.:540] Out: 600 [Urine:600]   Lab Results: Recent Labs    03/03/20 0209 03/04/20 0816  WBC 20.4*  --   HGB 12.0* 12.4*  HCT 38.3* 39.4  PLT 301  --    BMET Recent Labs    03/04/20 0816  NA 137  K 3.9  CL 101  CO2 25  GLUCOSE 117*  BUN 17  CREATININE 1.24  CALCIUM 9.3   LFT No results for input(s): PROT, ALBUMIN, AST, ALT, ALKPHOS, BILITOT, BILIDIR, IBILI in the last 72 hours. PT/INR No results for input(s): LABPROT, INR in the last 72 hours. Hepatitis Panel No results for input(s): HEPBSAG, HCVAB, HEPAIGM, HEPBIGM in the last 72 hours.   . CBC  Latest Ref Rng & Units 03/04/2020 03/03/2020 02/24/2020  WBC 4.0 - 10.5 K/uL - 20.4(H) 26.7(H)  Hemoglobin 13.0 - 17.0 g/dL 12.4(L) 12.0(L) 11.4(L)  Hematocrit 39 - 52 % 39.4 38.3(L) 35.2(L)  Platelets 150 - 400 K/uL - 301 226    . CMP Latest Ref Rng & Units 03/04/2020 03/02/2020 02/27/2020  Glucose 70 - 99 mg/dL 117(H) 99 130(H)  BUN 8 - 23 mg/dL 17 19 20   Creatinine 0.61 - 1.24 mg/dL 1.24 1.24 1.27(H)  Sodium 135 - 145 mmol/L 137 137 136  Potassium 3.5 - 5.1 mmol/L 3.9 4.1 4.1  Chloride  98 - 111 mmol/L 101 102 98  CO2 22 - 32 mmol/L 25 27 27   Calcium 8.9 - 10.3 mg/dL 9.3 9.3 9.6  Total Protein 6.5 - 8.1 g/dL - 6.4(L) -  Total Bilirubin 0.3 - 1.2 mg/dL - 0.8 -  Alkaline Phos 38 - 126 U/L - 60 -  AST 15 - 41 U/L - 21 -  ALT 0 - 44 U/L - 16 -   Studies/Results: No results found.  Active Problems:   SAH (subarachnoid hemorrhage) (HCC)   Suicide attempt ([place])   Intentional drug overdose ([place])   CLL (chronic lymphocytic leukemia) ([place])   Major depressive disorder, recurrent severe without psychotic features ([place])    Tye Savoy, NP-C @  03/05/2020, 9:38 AM   I have reviewed the entire case in detail with the above APP and discussed the plan in detail.  Therefore, I agree with the diagnoses recorded above. In addition,  I have personally interviewed and examined the patient.  My additional thoughts are as follows:  At least several months of persistent nausea and vomiting that he describes as beginning abruptly, which made him initially feel it was "food poisoning".  It has waxed and waned since then but not resolved. Outside imaging did not show any red flag features to suggest malignancy.  Chronic abdominal lymphadenopathy from CLL.  Now admitted with acute on chronic psychiatric issues awaiting inpatient psychiatric placement.  I offered him an upper endoscopy for further evaluation and he was agreeable after discussion of procedure and risks.  The benefits and risks  of the planned procedure were described in detail with the patient or (when appropriate) their health care proxy.  Risks were outlined as including, but not limited to, bleeding, infection, perforation, adverse medication reaction leading to cardiac or pulmonary decompensation, pancreatitis (if ERCP).  The limitation of incomplete mucosal visualization was also discussed.  No guarantees or warranties were given.  Patient at increased risk for cardiopulmonary complications of procedure due to medical comorbidities.   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  Although his QTc interval is normal, I agree with preferential use of ondansetron versus metoclopramide given his psychiatric issues and risk for extraparametal side effects.  The MAR seems to indicate that the last dose of Xarelto was last night.  If we confirm that is the case, then we will hold that medication this evening and plan for an upper endoscopy tomorrow.   Nelida Meuse III Office:[phone]

## 2020-03-05 NOTE — Consult Note (Addendum)
Referring Provider:  [facility]         Primary Care Physician:  Patient, No Pcp Per Primary Gastroenterologist: [facility]            We were asked to see this patient for:    nausea              ASSESSMENT / PLAN:   # Chronic nausea / vomiting --Started late June / early July. Nothing on CT scan in July or this admission to explain nausea.   --Symptoms predate starting narcotics at home and he continued to have symptoms even when not on pain medications.  --Other than known hematoma, no masses on head CT scan --For further evaluation will probably need EGD ( on Xarelto). He has eaten today.  --Continue PPI --Currently getting Reglan. Given his psych history Zofran may be better choice.   # CLL, WBC stable  # PE, on Xarelto since July  # Suicide attempt, this admission --Awaiting placment   HPI:                                                                                                                             Chief Complaint: nausea  David Oneill is a 64 y.o. male with a pmh significant for, not necessarily limited to: CLL, CAD/ NSTEMI July 2021, PAF, HTN, hyperlipidemia, PE asthma, sleep apnea, diverticulosis  Patient was hospitalized twice at [facility] over the summer with a PE and NSTEMI and then AKI / hypotension from volume depletion and polypharmacy.    Patient admitted to [facility] 10/5 for suicide attempt by taking Zanaflex and slitting his wrist. He had a subarachnoid hemorrhoid, Neurosurgery evaluated, Kcentra given. Follow up head CT stable.  Xarelto resumed 02/27/20. Psych has evaluated, awaiting geri-psych placement.   Patient began having nausea with vomiting in late June. He says there was mention of getting an EGD while hospitalized at [facility] over the summer with PE / NSTEMI but it wasn't done. He has continued to have daily episodes of nausea and vomiting. He lost 70 pounds ( fruits and vegetables super food supplements ) but that was prior  to the onset of nausea and vomiting. His weight has been stable over last several months. The vomiting is random. If vomits after eating the emesis contains food, otherwise it is generally fluid. He doesn't vomit food eaten several hours prior. No hematemesis or blood in stool. No significant NSAID use. Cannot related onset of vomiting with any of his home medications. He hasn't been taking ani-emetics at home. He was taking narcotics at home but nausea / vomiting predated those.   PREVIOUS ENDOSCOPIC EVALUATIONS / Anthony STUDIES   Feb 2021 Screening colonoscopy  02/20/20 CT scan abd / pelvis with and without contrast [facility] for abdominal pain  ABDOMEN/PELVIS:  Liver: Hepatomegaly.  Gallbladder/biliary: Within normal limits.  Spleen: Within normal limits.  Pancreas: Within normal limits.  Adrenals: Within normal limits.  Kidneys: No  hydronephrosis. No radiodense renal calculi. Bilateral subcentimeter hypodense lesions, too small to characterize.  Peritoneum/mesentery/extraperitoneum: Interval increase in size of multiple lymph nodes throughout the abdomen and pelvis, with index nodes as follows: paraspinal node measuring 1.1 cm (series 5/image 17), multiple enlarged mesenteric nodes, the largest jejunal node measuring 1.6 cm (series 5/image 70), retroperitoneal lymph nodes measuring 1.4 cm in short axis (series 3/image 65; series 3/image 73), common iliac nodal conglomerate measuring 2.0 x 1.9 cm (series 5/image 114), and bilateral external iliac lymph nodes, with deep index nodes measuring 1.4 cm on the right (series 5/image 150) and 1.3 cm on the left (series 5/image 148). Interval increase in size of multiple bilateral pelvic sidewall lymph nodes as well.  Gastrointestinal tract: Scattered diverticula of the sigmoid and descending colon without evidence of superimposed diverticulitis. Hyperdense material in the large bowel.  Ureters: Within normal limits.  Bladder: Within normal limits.    Reproductive System: Within normal limits.  Vascular: Mixed atherosclerosis of the aortoiliac system.  MSK: No acute osseous or soft tissue findings.   11/27/19 CTA abd at [facility] for abdominal pain  ABDOMEN  Liver: Scattered subcentimeter hypodensities are too small to characterize. The liver is enlarged, measuring 22 cm in craniocaudal dimension.  Gallbladder/biliary: Within normal limits.  Spleen: Within normal limits.  Pancreas: Within normal limits.  Adrenals: Within normal limits.  Kidneys: Symmetric enhancement. No hydronephrosis or nephrolithiasis. Bilateral subcentimetercortical hypodensities are too small to characterize.  Peritoneum: Within normal limits.  Extraperitoneum: Similar borderline enlarged small bowel mesentery and retroperitoneal lymph nodes, unchanged from yesterday's exam.  Gastrointestinal tract: No abnormally dilated bowel loops. No suspicious bowel wall thickening or enhancement. Extensive colonic diverticulosis without adjacent inflammatory changes. The appendix is normal.  Ureters: Within normal limits.  Bladder: Distended with contrast material. There is mild diffuse bladder wall thickening.  Reproductive system: Within normal limits.   VASCULAR  Aorta: Scattered aortoiliac atherosclerotic calcifications.  Celiac trunk and branches: Within normal limits.  Superior mesenteric trunk and branches:Within normal limits.  Inferior mesenteric trunk and branches: Within normal limits.  Renal arteries: Within normal limits.  Iliac arteries: Within normal limits.  Venous circulation: Within normal limits.    11/27/19 [place] for elevated bilirubin - [facility] US ABDOMEN LIMITED, 11/27/2019 5:25 PM   INDICATION: elevated t bili, looking for gallstones, biliary duct dilation \ R10.84 Abdominal pain, acute, generalized  ADDITIONAL HISTORY: None.  COMPARISON: CT abdomen pelvis 11/26/2019   TECHNIQUE: Multi-planar real-time ultrasonography of the upper abdomen  (right upper quadrant) using grayscale imaging, supplemented by color, power, and spectral Doppler as needed.   FINDINGS:   . Vascular: The inferior vena cava is patent. The visualized aorta is unremarkable.  . Pancreas: Incompletely imaged. Visualized portions are unremarkable.  . Liver: The liver measures 20 cm in its craniocaudal dimension. Increased size with normal echotexture. No focal lesions. The portal and hepatic venous systems are patent with antegrade flow.  . Gallbladder: No gallstones or sludge. No gallbladder wall thickening or pericholecystic fluid. Negative sonographic Murphy's sign.  . Biliary: The maximum caliber of the visualized common bile duct is 1.4 mm. No intrahepatic or extrahepatic ductal dilatation.  . Right kidney: The right kidney measures 11.2 cm in length. Normal size, contour, and echogenicity. The left kidney measures 12.3 cm. Normal size contour and echogenicity. No hydronephrosis or perinephric fluid. No focal mass is identified.  . Peritoneum: No ascites.  . Additional comments: None.    Past Medical History:  Diagnosis Date  . Chronic  back pain   . Suicide attempt ([place]) 02/22/2020    Past Surgical History:  Procedure Laterality Date  . ABLATION OF DYSRHYTHMIC FOCUS      Prior to Admission medications   Medication Sig Start Date End Date Taking? Authorizing Provider  acetaminophen (TYLENOL) 325 MG tablet Take 325 mg by mouth every 4 (four) hours as needed for mild pain.  01/01/20   [provider]  albuterol (VENTOLIN HFA) 108 (90 Base) MCG/ACT inhaler Inhale 1-2 puffs into the lungs every 6 (six) hours as needed for wheezing or shortness of breath.  08/24/19   [provider]  aspirin 81 MG EC tablet Take 81 mg by mouth daily.  12/07/19   [provider]  budesonide-formoterol (SYMBICORT) 160-4.5 MCG/ACT inhaler Inhale 2 puffs into the lungs daily.  01/01/20 03/31/20  [provider]  cyanocobalamin 1000 MCG  tablet Take 1,000 mcg by mouth daily.  07/09/17   [provider]  DULoxetine (CYMBALTA) 60 MG capsule Take 60 mg by mouth daily.  10/27/19   [provider]  fluticasone (FLONASE) 50 MCG/ACT nasal spray Place 2 sprays into both nostrils daily as needed for allergies.  06/17/18   [provider]  melatonin 3 MG TABS tablet Take 3 mg by mouth. 12/07/19   [provider]  metoprolol tartrate (LOPRESSOR) 25 MG tablet Take 12.5 mg by mouth in the morning and at bedtime. 01/01/20   [provider]  oxyCODONE (OXY IR/ROXICODONE) 5 MG immediate release tablet Take 10 mg by mouth every 8 (eight) hours as needed for moderate pain.     [provider]  pantoprazole (PROTONIX) 40 MG tablet Take 40 mg by mouth daily.  12/07/19   [provider]  promethazine (PHENERGAN) 25 MG tablet Take 25 mg by mouth every 6 (six) hours as needed for nausea.  12/27/19   [provider]  rOPINIRole (REQUIP) 1 MG tablet Take 2 mg by mouth at bedtime.  10/02/19   [provider]  rosuvastatin (CRESTOR) 40 MG tablet Take 40 mg by mouth daily.  12/07/19   [provider]  thiamine 500 MG tablet Take 1,000 mg by mouth daily.  02/09/19   [provider]  tiZANidine (ZANAFLEX) 4 MG tablet Take 4-8 mg by mouth every 8 (eight) hours as needed for muscle spasms.  02/01/20   [provider]  XARELTO 20 MG TABS tablet Take 20 mg by mouth at bedtime. 12/29/19   [provider]    Current Facility-Administered Medications  Medication Dose Route Frequency Provider Last Rate Last Admin  . acetaminophen (TYLENOL) tablet 650 mg  650 mg Oral Q6H PRN Jose Persia, MD   650 mg at 03/04/20 2122  . albuterol (PROVENTIL) (2.5 MG/3ML) 0.083% nebulizer solution 2.5 mg  2.5 mg Nebulization Q2H PRN Magdalen Spatz, NP      . docusate sodium (COLACE) capsule 100 mg  100 mg Oral BID PRN Magdalen Spatz, NP   100 mg at 03/04/20 0913  . escitalopram  (LEXAPRO) tablet 5 mg  5 mg Oral Daily Eulogio Bear U, DO   5 mg at 03/05/20 0811  . famotidine (PEPCID) tablet 20 mg  20 mg Oral BID Eulogio Bear U, DO   20 mg at 03/05/20 9024  . fluticasone (FLONASE) 50 MCG/ACT nasal spray 2 spray  2 spray Each Nare Daily PRN Eulogio Bear U, DO   2 spray at 03/03/20 2142  . metoCLOPramide (REGLAN) tablet 5 mg  5  mg Oral Q6H Mikhail, Agar, DO   5 mg at 03/05/20 0541  . metoprolol tartrate (LOPRESSOR) tablet 12.5 mg  12.5 mg Oral BID Eulogio Bear U, DO   12.5 mg at 03/05/20 8841  . mometasone-formoterol (DULERA) 100-5 MCG/ACT inhaler 2 puff  2 puff Inhalation BID Jose Persia, MD   2 puff at 03/05/20 0803  . multivitamin with minerals tablet 1 tablet  1 tablet Oral Daily Jacky Kindle, MD   1 tablet at 03/05/20 0812  . naloxone [facility]) injection 0.4 mg  0.4 mg Intravenous PRN Simonne Maffucci B, MD   0.4 mg at 02/21/20 2202  . ondansetron (ZOFRAN) injection 4 mg  4 mg Intravenous Q6H PRN Magdalen Spatz, NP   4 mg at 03/04/20 1026  . pantoprazole (PROTONIX) EC tablet 40 mg  40 mg Oral BID Eulogio Bear U, DO   40 mg at 03/05/20 6606  . polyethylene glycol (MIRALAX / GLYCOLAX) packet 17 g  17 g Oral Daily PRN Magdalen Spatz, NP   17 g at 03/03/20 0818  . ramelteon (ROZEREM) tablet 8 mg  8 mg Oral QHS Patrecia Pour, NP   8 mg at 03/04/20 2109  . rivaroxaban (XARELTO) tablet 20 mg  20 mg Oral QHS Vann, Jessica U, DO   20 mg at 03/04/20 2109  . thiamine tablet 100 mg  100 mg Oral Daily Eulogio Bear U, DO   100 mg at 03/05/20 3016  . vitamin B-12 (CYANOCOBALAMIN) tablet 1,000 mcg  1,000 mcg Oral Daily Eulogio Bear U, DO   1,000 mcg at 03/05/20 0109    Allergies as of 02/21/2020  . (Not on File)    History reviewed. No pertinent family history.  Social History   Socioeconomic History  . Marital status: Single    Spouse name: Not on file  . Number of children: Not on file  . Years of education: Not on file  . Highest education level: Not on file   Occupational History  . Not on file  Tobacco Use  . Smoking status: Never Smoker  . Smokeless tobacco: Never Used  Vaping Use  . Vaping Use: Never used  Substance and Sexual Activity  . Alcohol use: Not Currently  . Drug use: Yes    Types: Marijuana  . Sexual activity: Not on file  Other Topics Concern  . Not on file  Social History Narrative  . Not on file   Social Determinants of Health   Financial Resource Strain:   . Difficulty of Paying Living Expenses: Not on file  Food Insecurity:   . Worried About Charity fundraiser in the Last Year: Not on file  . Ran Out of Food in the Last Year: Not on file  Transportation Needs:   . Lack of Transportation (Medical): Not on file  . Lack of Transportation (Non-Medical): Not on file  Physical Activity:   . Days of Exercise per Week: Not on file  . Minutes of Exercise per Session: Not on file  Stress:   . Feeling of Stress : Not on file  Social Connections:   . Frequency of Communication with Friends and Family: Not on file  . Frequency of Social Gatherings with Friends and Family: Not on file  . Attends Religious Services: Not on file  . Active Member of Clubs or Organizations: Not on file  . Attends Archivist Meetings: Not on file  . Marital Status: Not on file  Intimate Partner Violence:   .  Fear of Current or Ex-Partner: Not on file  . Emotionally Abused: Not on file  . Physically Abused: Not on file  . Sexually Abused: Not on file    Review of Systems: All systems reviewed and negative except where noted in HPI.   OBJECTIVE:    Physical Exam: Vital signs in last 24 hours: Temp:  [98 F (36.7 C)-98.6 F (37 C)] 98 F (36.7 C) (10/18 0524) Pulse Rate:  [66-78] 78 (10/18 0810) Resp:  [18-20] 20 (10/18 0810) BP: (126-154)/(69-96) 135/96 (10/18 0810) SpO2:  [96 %-100 %] 100 % (10/18 0810) Weight:  [103.7 kg] 103.7 kg (10/18 0524) Last BM Date: 03/04/20 General:   Alert, well-developed male in  NAD Psych:  Pleasant, cooperative. Normal mood and affect. Eyes:  Pupils equal, sclera clear, no icterus.   Conjunctiva pink. Ears:  Normal auditory acuity. Nose:  No deformity, discharge,  or lesions. Neck:  Supple; no masses Lungs:  Clear throughout to auscultation.   No wheezes, crackles, or rhonchi.  Heart:  Regular rate and rhythm; no murmurs, no lower extremity edema Abdomen:  Soft, non-distended, mild LUQ tenderness. BS active, no palp mass   Rectal:  Deferred  Msk:  Symmetrical without gross deformities. . Neurologic:  Alert and  oriented x4;  grossly normal neurologically. Skin:  Intact without significant lesions or rashes.  Filed Weights   03/02/20 0504 03/04/20 0045 03/05/20 0524  Weight: 104 kg 104.6 kg 103.7 kg     Scheduled inpatient medications . escitalopram  5 mg Oral Daily  . famotidine  20 mg Oral BID  . metoCLOPramide  5 mg Oral Q6H  . metoprolol tartrate  12.5 mg Oral BID  . mometasone-formoterol  2 puff Inhalation BID  . multivitamin with minerals  1 tablet Oral Daily  . pantoprazole  40 mg Oral BID  . ramelteon  8 mg Oral QHS  . rivaroxaban  20 mg Oral QHS  . thiamine  100 mg Oral Daily  . cyanocobalamin  1,000 mcg Oral Daily      Intake/Output from previous day: 10/17 0701 - 10/18 0700 In: 3000 [P.O.:3000] Out: 3350 [Urine:3350] Intake/Output this shift: Total I/O In: 540 [P.O.:540] Out: 600 [Urine:600]   Lab Results: Recent Labs    03/03/20 0209 03/04/20 0816  WBC 20.4*  --   HGB 12.0* 12.4*  HCT 38.3* 39.4  PLT 301  --    BMET Recent Labs    03/04/20 0816  NA 137  K 3.9  CL 101  CO2 25  GLUCOSE 117*  BUN 17  CREATININE 1.24  CALCIUM 9.3   LFT No results for input(s): PROT, ALBUMIN, AST, ALT, ALKPHOS, BILITOT, BILIDIR, IBILI in the last 72 hours. PT/INR No results for input(s): LABPROT, INR in the last 72 hours. Hepatitis Panel No results for input(s): HEPBSAG, HCVAB, HEPAIGM, HEPBIGM in the last 72 hours.   . CBC  Latest Ref Rng & Units 03/04/2020 03/03/2020 02/24/2020  WBC 4.0 - 10.5 K/uL - 20.4(H) 26.7(H)  Hemoglobin 13.0 - 17.0 g/dL 12.4(L) 12.0(L) 11.4(L)  Hematocrit 39 - 52 % 39.4 38.3(L) 35.2(L)  Platelets 150 - 400 K/uL - 301 226    . CMP Latest Ref Rng & Units 03/04/2020 03/02/2020 02/27/2020  Glucose 70 - 99 mg/dL 117(H) 99 130(H)  BUN 8 - 23 mg/dL 17 19 20   Creatinine 0.61 - 1.24 mg/dL 1.24 1.24 1.27(H)  Sodium 135 - 145 mmol/L 137 137 136  Potassium 3.5 - 5.1 mmol/L 3.9 4.1 4.1  Chloride  98 - 111 mmol/L 101 102 98  CO2 22 - 32 mmol/L 25 27 27   Calcium 8.9 - 10.3 mg/dL 9.3 9.3 9.6  Total Protein 6.5 - 8.1 g/dL - 6.4(L) -  Total Bilirubin 0.3 - 1.2 mg/dL - 0.8 -  Alkaline Phos 38 - 126 U/L - 60 -  AST 15 - 41 U/L - 21 -  ALT 0 - 44 U/L - 16 -   Studies/Results: No results found.  Active Problems:   SAH (subarachnoid hemorrhage) (HCC)   Suicide attempt ([place])   Intentional drug overdose ([place])   CLL (chronic lymphocytic leukemia) ([place])   Major depressive disorder, recurrent severe without psychotic features ([place])    Tye Savoy, NP-C @  03/05/2020, 9:38 AM   I have reviewed the entire case in detail with the above APP and discussed the plan in detail.  Therefore, I agree with the diagnoses recorded above. In addition,  I have personally interviewed and examined the patient.  My additional thoughts are as follows:  At least several months of persistent nausea and vomiting that he describes as beginning abruptly, which made him initially feel it was "food poisoning".  It has waxed and waned since then but not resolved. Outside imaging did not show any red flag features to suggest malignancy.  Chronic abdominal lymphadenopathy from CLL.  Now admitted with acute on chronic psychiatric issues awaiting inpatient psychiatric placement.  I offered him an upper endoscopy for further evaluation and he was agreeable after discussion of procedure and risks.  The benefits and risks  of the planned procedure were described in detail with the patient or (when appropriate) their health care proxy.  Risks were outlined as including, but not limited to, bleeding, infection, perforation, adverse medication reaction leading to cardiac or pulmonary decompensation, pancreatitis (if ERCP).  The limitation of incomplete mucosal visualization was also discussed.  No guarantees or warranties were given.  Patient at increased risk for cardiopulmonary complications of procedure due to medical comorbidities.   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  Although his QTc interval is normal, I agree with preferential use of ondansetron versus metoclopramide given his psychiatric issues and risk for extraparametal side effects.  The MAR seems to indicate that the last dose of Xarelto was last night.  If we confirm that is the case, then we will hold that medication this evening and plan for an upper endoscopy tomorrow.   Nelida Meuse III [phone]

## 2020-03-05 NOTE — Care Management Important Message (Signed)
Important Message  Patient Details  Name: Courtenay Creger MRN: [id number] Date of Birth: [birth year]   Medicare Important Message Given:  Yes     Shelda Altes 03/05/2020, 3:26 PM

## 2020-03-06 ENCOUNTER — Inpatient Hospital Stay (HOSPITAL_COMMUNITY): Payer: Medicare Other | Admitting: Anesthesiology

## 2020-03-06 ENCOUNTER — Encounter (HOSPITAL_COMMUNITY)
Admission: EM | Disposition: A | Discharge status: Home / Self Care | Payer: Self-pay | Source: Home / Self Care | Attending: Family Medicine

## 2020-03-06 ENCOUNTER — Encounter (HOSPITAL_COMMUNITY): Payer: Self-pay | Admitting: Pulmonary Disease

## 2020-03-06 DIAGNOSIS — C9110 Chronic lymphocytic leukemia of B-cell type not having achieved remission: Secondary | ICD-10-CM | POA: Diagnosis not present

## 2020-03-06 DIAGNOSIS — R112 Nausea with vomiting, unspecified: Secondary | ICD-10-CM

## 2020-03-06 DIAGNOSIS — F332 Major depressive disorder, recurrent severe without psychotic features: Secondary | ICD-10-CM | POA: Diagnosis not present

## 2020-03-06 DIAGNOSIS — T50902A Poisoning by unspecified drugs, medicaments and biological substances, intentional self-harm, initial encounter: Secondary | ICD-10-CM | POA: Diagnosis not present

## 2020-03-06 DIAGNOSIS — K229 Disease of esophagus, unspecified: Secondary | ICD-10-CM

## 2020-03-06 DIAGNOSIS — I609 Nontraumatic subarachnoid hemorrhage, unspecified: Secondary | ICD-10-CM | POA: Diagnosis not present

## 2020-03-06 DIAGNOSIS — R110 Nausea: Secondary | ICD-10-CM

## 2020-03-06 HISTORY — PX: BIOPSY: SHX5522

## 2020-03-06 HISTORY — PX: ESOPHAGOGASTRODUODENOSCOPY: SHX5428

## 2020-03-06 LAB — GLUCOSE, CAPILLARY: Glucose-Capillary: 102 mg/dL — ABNORMAL HIGH (ref 70–99)

## 2020-03-06 SURGERY — EGD (ESOPHAGOGASTRODUODENOSCOPY)
Anesthesia: Monitor Anesthesia Care

## 2020-03-06 MED ORDER — RIVAROXABAN 20 MG PO TABS
20.0000 mg | ORAL_TABLET | Freq: Every day | ORAL | Status: DC
  Administered 2020-03-06 – 2020-03-26 (×21): 20 mg via ORAL
  Filled 2020-03-06 (×21): qty 1

## 2020-03-06 MED ORDER — ONDANSETRON 4 MG PO TBDP
4.0000 mg | ORAL_TABLET | Freq: Three times a day (TID) | ORAL | Status: DC
  Administered 2020-03-06 – 2020-03-23 (×64): 4 mg via ORAL
  Filled 2020-03-06 (×71): qty 1

## 2020-03-06 MED ORDER — PROPOFOL 500 MG/50ML IV EMUL
INTRAVENOUS | Status: DC | PRN
  Administered 2020-03-06: 200 ug/kg/min via INTRAVENOUS

## 2020-03-06 MED ORDER — SODIUM CHLORIDE 0.9 % IV SOLN: INTRAVENOUS | Status: DC

## 2020-03-06 NOTE — Anesthesia Preprocedure Evaluation (Signed)
Anesthesia Evaluation  Patient identified by MRN, date of birth, ID band Patient awake    Reviewed: Allergy & Precautions, H&P , NPO status , Patient's Chart, lab work & pertinent test results  Airway Mallampati: II   Neck ROM: full    Dental   Pulmonary PE Takes xarelto   breath sounds clear to auscultation       Cardiovascular +CHF  + dysrhythmias Atrial Fibrillation  Rhythm:irregular Rate:Normal  Diastolic heart failure. H/o EP ablation.   Neuro/Psych PSYCHIATRIC DISORDERS Depression Recent suicide attempt with oxycodone and restoril.   GI/Hepatic Pt with nausea and vomiting   Endo/Other    Renal/GU      Musculoskeletal   Abdominal   Peds  Hematology CLL   Anesthesia Other Findings   Reproductive/Obstetrics                             Anesthesia Physical Anesthesia Plan  ASA: III  Anesthesia Plan: MAC   Post-op Pain Management:    Induction: Intravenous  PONV Risk Score and Plan: 1 and Propofol infusion and Treatment may vary due to age or medical condition  Airway Management Planned: Nasal Cannula  Additional Equipment:   Intra-op Plan:   Post-operative Plan:   Informed Consent: I have reviewed the patients History and Physical, chart, labs and discussed the procedure including the risks, benefits and alternatives for the proposed anesthesia with the patient or authorized representative who has indicated his/her understanding and acceptance.       Plan Discussed with: CRNA, Anesthesiologist and Surgeon  Anesthesia Plan Comments:         Anesthesia Quick Evaluation

## 2020-03-06 NOTE — Progress Notes (Signed)
Nutrition Follow-up  RD working remotely.  DOCUMENTATION CODES:   Obesity unspecified  INTERVENTION:   Once diet is advanced, resume:  -Magic cup TID with meals, each supplement provides 290 kcal and 9 grams of protein -MVI with minerals daily  NUTRITION DIAGNOSIS:   Inadequate oral intake related to decreased appetite as evidenced by meal completion < 50%.  Ongoing  GOAL:   Patient will meet greater than or equal to 90% of their needs  Progressing   MONITOR:   PO intake, Supplement acceptance, Weight trends, Skin, Labs, I & O's  REASON FOR ASSESSMENT:   Malnutrition Screening Tool    ASSESSMENT:   Pt admitted with AMS 2/2 intentional drug OD and head trauma after fall. Pt also slit both wrists. PMH includes A.fib, s/p ablation x2, restrictive lung disease, OSA, HTN, large PEs 11/2019 prompting NSTEMI, CLL, and h/o EtOH abuse. 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  Reviewed I/O's: -2.3 L x 24 hours and -26.6 L since admission  UOP: 3.5 L x 24 hours  Pt down in endoscopy suite for EGD at time of attempted contact. Pt with history of nausea and vomiting over the past several months.  Appetite remains good; noted meal completion 100%.   Pt continues to await geri-psych placement for discharge.  Medications reviewed and include reglan, thiamine, vitamin B-12 and 0.9% sodium chloride infusion @ 20 ml/hr.   Labs reviewed: CBGS: 102.   Diet Order:   Diet Order            Diet NPO time specified  Diet effective midnight                 EDUCATION NEEDS:   Not appropriate for education at this time  Skin:  Skin Assessment: Skin Integrity Issues: Skin Integrity Issues:: Other (Comment) Other: lacerations bilateral wrists  Last BM:  03/05/20  Height:   Ht Readings from Last 1 Encounters:  02/28/20 6' (1.829 m)    Weight:   Wt Readings from Last 1 Encounters:  03/06/20 103.2 kg    Ideal Body  Weight:  80.91 kg  BMI:  Body mass index is 30.87 kg/m.  Estimated Nutritional Needs:   Kcal:  2200-2400  Protein:  120-135 grams  Fluid:  > 2 L    Loistine Chance, RD, LDN, [place] Registered Dietitian II Certified Diabetes Care and Education Specialist Please refer to [facility] for RD and/or RD on-call/weekend/after hours pager

## 2020-03-06 NOTE — Progress Notes (Signed)
PROGRESS NOTE    David Oneill  NWG:[id number] DOB: 07-14-1955 DOA: 02/21/2020 PCP: Patient, No Pcp Per   Brief Narrative:  Patient was admitted with suicidal intent and presented with drug overdose with oxycodone and Restoril along with wrist lacerations.  Patient initially admitted by PCCM.  CT head was noted to have a small occipital intraparenchymal hemorrhage and subarachnoid extension.  Psychiatry consulted and recommended inpatient psych.  Patient now awaiting [facility] psych placement. Assessment & Plan   Non-aneurysmal small left occipital intraparenchymal hemorrhage with subarachnoid extension/ Acute toxic encephalopathy  -Secondary to drug overdose with oxycodone and Restoril along with trauma in the light of anticoagulant (Xarelto) use -Xarelto reversed with Kcentra in the ED -Neurosurgery consulted; appreciate their recommendations- resumed xarelto 10/11 -Repeat head CT stable -mental status stable  Chronic atrial fibrillation/flutter -Continue Xarelto, metoprolol  Suicidal attempt with bilateral wrist laceration and drug overdose -psych consult- needs geri-psych placement -suicide precautions -SW asked for psych re-consult- Reconsulted on 02/28/2020, and continues to need inpatient psychiatry placement when medically stable -discussed with TOC on 10/14- still searching for geripsych -Have ordered a repeat chest x-ray which was unremarkable for infection.  CBC is stable.  CMP appears to be within normal limits.  EKG showed AF, no acute changes. 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  Recommending continuing Lexapro.  Remeron and melatonin were discontinued.  Rozerem was started at bedtime.  Chronic diastolic heart failure -Currently appears to be euvolemic and compensated -Continue to monitor I's and O's, daily weights  Recent Massive PE -Continue  Xarelto  CLL -WBCs stable  Obesity -BMI 31.33  -Patient will need to follow-up with his PCP on discharge and discuss lifestyle modifications  Hypokalemia -Resolved -continue to monitor BMP intermittently   Insomnia -Continue trazodone  Nausea/possible esophageal candidiasis -Patient continues to complain of nausea without vomiting (states no vomiting since last week) -currently on reglan and PRN antiemetics -Currently able to tolerate his diet -Has not had an endoscopy and states he had a colonoscopy a couple of years ago at [facility] -Gastroenterology consulted and appreciated -Status post EGD which showed esophageal plaques, suspicious for candidiasis, biopsied.  Mild duodenal bulb erythema and mildly congested mucosa in the second and of the duodenum.  No clear cause of nausea and vomiting found.  Recommendations are to await pathology, continue PPI.  Discontinue famotidine and Reglan.  Continue Zofran.  May resume Xarelto.  DVT Prophylaxis SCDs/Xarelto  Code Status: Full  Family Communication: None at bedside  Disposition Plan:  Status is: Inpatient  Remains inpatient appropriate because:Unsafe d/c plan   Dispo: The patient is from: Home              Anticipated d/c is to: Geripsych              Anticipated d/c date is: > 3 days              Patient currently is medically stable to d/c.   Consultants PCCM Psychiatry Gastroenterology  Procedures  None  Antibiotics   Anti-infectives (From admission, onward)   None      Subjective:   David Oneill seen and examined today.  Continues to feel some nausea however no vomiting.  Is extremely happy that he is having an EGD in hopes that it will find something.  Denies current chest pain, shortness of breath, abdominal pain, nausea or vomiting, diarrhea constipation.  Does feel that he is sleeping  better.   Objective:   Vitals:   03/06/20 0725 03/06/20 0837 03/06/20 0847 03/06/20 0857  BP: (!) 144/59 (!)  163/96 (!) 177/54 (!) 196/90  Pulse: 70 65 95 81  Resp: 20 19 (!) 22 (!) 28  Temp: 97.7 F (36.5 C) 98.1 F (36.7 C)    TempSrc: Oral Oral    SpO2: 96% 96% 100% 99%  Weight:      Height:        Intake/Output Summary (Last 24 hours) at 03/06/2020 0943 Last data filed at 03/06/2020 0929 Gross per 24 hour  Intake 1020 ml  Output 2850 ml  Net -1830 ml   Filed Weights   03/04/20 0045 03/05/20 0524 03/06/20 0500  Weight: 104.6 kg 103.7 kg 103.2 kg   Exam  General: Well developed, well nourished, NAD  HEENT: NCAT,  mucous membranes moist.   Cardiovascular: S1 S2 auscultated  Respiratory: Clear to auscultation   Abdomen: Soft, nontender, nondistended, + bowel sounds  Extremities: warm dry without cyanosis clubbing or edema  Neuro: AAOx3, nonfocal  Psych: appropriate mood and affect, pleasant  Data Reviewed: I have personally reviewed following labs and imaging studies  CBC: Recent Labs  Lab 03/03/20 0209 03/04/20 0816  WBC 20.4*  --   HGB 12.0* 12.4*  HCT 38.3* 39.4  MCV 90.1  --   PLT 301  --    Basic Metabolic Panel: Recent Labs  Lab 03/02/20 0356 03/04/20 0816  NA 137 137  K 4.1 3.9  CL 102 101  CO2 27 25  GLUCOSE 99 117*  BUN 19 17  CREATININE 1.24 1.24  CALCIUM 9.3 9.3  MG  --  2.2   GFR: Estimated Creatinine Clearance: 73.8 mL/min (by C-G formula based on SCr of 1.24 mg/dL). Liver Function Tests: Recent Labs  Lab 03/02/20 0356  AST 21  ALT 16  ALKPHOS 60  BILITOT 0.8  PROT 6.4*  ALBUMIN 3.8   No results for input(s): LIPASE, AMYLASE in the last 168 hours. No results for input(s): AMMONIA in the last 168 hours. Coagulation Profile: No results for input(s): INR, PROTIME in the last 168 hours. Cardiac Enzymes: No results for input(s): CKTOTAL, CKMB, CKMBINDEX, TROPONINI in the last 168 hours. BNP (last 3 results) No results for input(s): PROBNP in the last 8760 hours. HbA1C: No results for input(s): HGBA1C in the last 72  hours. CBG: Recent Labs  Lab 03/06/20 0649  GLUCAP 102*   Lipid Profile: No results for input(s): CHOL, HDL, LDLCALC, TRIG, CHOLHDL, LDLDIRECT in the last 72 hours. Thyroid Function Tests: No results for input(s): TSH, T4TOTAL, FREET4, T3FREE, THYROIDAB in the last 72 hours. Anemia Panel: No results for input(s): VITAMINB12, FOLATE, FERRITIN, TIBC, IRON, RETICCTPCT in the last 72 hours. Urine analysis: No results found for: COLORURINE, APPEARANCEUR, LABSPEC, PHURINE, GLUCOSEU, HGBUR, BILIRUBINUR, KETONESUR, PROTEINUR, UROBILINOGEN, NITRITE, LEUKOCYTESUR Sepsis Labs: @LABRCNTIP (procalcitonin:4,lacticidven:4)  ) No results found for this or any previous visit (from the past 240 hour(s)).    Radiology Studies: No results found.   Scheduled Meds: . escitalopram  5 mg Oral Daily  . metoprolol tartrate  12.5 mg Oral BID  . mometasone-formoterol  2 puff Inhalation BID  . multivitamin with minerals  1 tablet Oral Daily  . ondansetron  4 mg Oral TID WC & HS  . pantoprazole  40 mg Oral BID  . ramelteon  8 mg Oral QHS  . thiamine  100 mg Oral Daily  . cyanocobalamin  1,000 mcg Oral Daily   Continuous  Infusions:   LOS: 14 days   Time Spent in minutes   15 minutes  Joram Venson D.O. on 03/06/2020 at 9:43 AM  Between 7am to 7pm - Please see pager noted on [email or web address]  After 7pm go to [email or web address]  And look for the night coverage person covering for me after hours  [facility] Office  [phone]

## 2020-03-06 NOTE — Interval H&P Note (Signed)
History and Physical Interval Note:  03/06/2020 8:07 AM  David Oneill  has presented today for surgery, with the diagnosis of nausea and vomiting.  The various methods of treatment have been discussed with the patient and family. After consideration of risks, benefits and other options for treatment, the patient has consented to  Procedure(s): ESOPHAGOGASTRODUODENOSCOPY (EGD) (N/A) as a surgical intervention.  The patient's history has been reviewed, patient examined, no change in status, stable for surgery.  I have reviewed the patient's chart and labs.  Questions were answered to the patient's satisfaction.     Pricilla Riffle. Fuller Plan

## 2020-03-06 NOTE — Transfer of Care (Signed)
Immediate Anesthesia Transfer of Care Note  Patient: David Oneill  Procedure(s) Performed: ESOPHAGOGASTRODUODENOSCOPY (EGD) (N/A ) BIOPSY  Patient Location: Endoscopy Unit  Anesthesia Type:MAC  Level of Consciousness: drowsy  Airway & Oxygen Therapy: Patient Spontanous Breathing and Patient connected to nasal cannula oxygen  Post-op Assessment: Report given to RN and Post -op Vital signs reviewed and stable  Post vital signs: Reviewed and stable  Last Vitals:  Vitals Value Taken Time  BP 163/96 03/06/20 0837  Temp    Pulse 85 03/06/20 0838  Resp 19 03/06/20 0838  SpO2 94 % 03/06/20 0838  Vitals shown include unvalidated device data.  Last Pain:  Vitals:   03/06/20 0725  TempSrc: Oral  PainSc: 9       Patients Stated Pain Goal: 0 (75/43/60 6770)  Complications: No complications documented.

## 2020-03-06 NOTE — Anesthesia Procedure Notes (Signed)
Procedure Name: MAC Date/Time: 03/06/2020 8:20 AM Performed by: Colin Benton, CRNA Pre-anesthesia Checklist: Patient identified, Emergency Drugs available, Suction available and Patient being monitored Patient Re-evaluated:Patient Re-evaluated prior to induction Oxygen Delivery Method: Nasal cannula Induction Type: IV induction Placement Confirmation: positive ETCO2 Dental Injury: Teeth and Oropharynx as per pre-operative assessment

## 2020-03-06 NOTE — Op Note (Signed)
[facility] Patient Name: David Oneill Procedure Date : 03/06/2020 MRN: [id number] Attending MD: Ladene Artist , MD Date of Birth: [birth year] CSN: [id number] Age: 64 Admit Type: Inpatient Procedure:                Upper GI endoscopy Indications:              Nausea with vomiting Providers:                Pricilla Riffle. Fuller Plan, MD, Josie Dixon, RN, Particia Nearing, RN, Tyna Jaksch Technician Referring MD:              Medicines:                Monitored Anesthesia Care Complications:            No immediate complications. Estimated Blood Loss:     Estimated blood loss was minimal. Procedure:                Pre-Anesthesia Assessment:                           - Prior to the procedure, a History and Physical                            was performed, and patient medications and                            allergies were reviewed. The patient's tolerance of                            previous anesthesia was also reviewed. The risks                            and benefits of the procedure and the sedation                            options and risks were discussed with the patient.                            All questions were answered, and informed consent                            was obtained. Prior Anticoagulants: The patient has                            taken Xarelto (rivaroxaban), last dose was 2 days                            prior to procedure. ASA Grade Assessment: III - A                            patient with severe systemic disease. After  reviewing the risks and benefits, the patient was                            deemed in satisfactory condition to undergo the                            procedure.                           After obtaining informed consent, the endoscope was                            passed under direct vision. Throughout the                            procedure, the patient's blood  pressure, pulse, and                            oxygen saturations were monitored continuously. The                            GIF-H190 ([id number]) Olympus gastroscope was                            introduced through the mouth, and advanced to the                            second part of duodenum. The upper GI endoscopy was                            accomplished without difficulty. The patient                            tolerated the procedure well. Scope In: Scope Out: Findings:      Patchy, white plaques were found in the mid esophagus. Most of the       exudates were removed with lavage. Biopsies were taken with a cold       forceps for histology.      The exam of the esophagus was otherwise normal.      The entire examined stomach was normal.      Localized mildly erythematous mucosa without active bleeding and with no       stigmata of bleeding was found in the duodenal bulb.      Diffuse mildly congested mucosa without active bleeding and with no       stigmata of bleeding was found in the second portion of the duodenum. Impression:               - Esophageal plaques were found, suspicious for                            candidiasis. Biopsied.                           - Normal stomach.                           -  Mild duodenal bulb erythema and mildly congested                            mucosa in second portion of the duodenum. Recommendation:           - Return patient to hospital ward for ongoing care.                           - No clear cause for N/V was found. Esophageal                            candidasis is possible, await pathology.                           - Resume previous diet.                           - Continue present medications.                           - Change pantoprazole to 40 mg po qam.                           - Discontinue famotidine and metoclopramide.                           - Ondansetron 4 mg po ac and hs for now.                           -  Resume Xarelto (rivaroxaban) at prior dose today.                            Refer to managing physician for further adjustment                            of therapy.                           - Await pathology results. Procedure Code(s):        --- Professional ---                           [phone], Esophagogastroduodenoscopy, flexible,                            transoral; with biopsy, single or multiple Diagnosis Code(s):        --- Professional ---                           K22.9, Disease of esophagus, unspecified                           R11.2, Nausea with vomiting, unspecified CPT copyright 2019 American Medical Association. All rights reserved. The codes documented in this report are preliminary and upon coder review may  be revised to meet current compliance requirements. Ladene Artist, MD 03/06/2020  8:43:20 AM This report has been signed electronically. Number of Addenda: 0

## 2020-03-07 ENCOUNTER — Other Ambulatory Visit: Payer: Self-pay | Admitting: Physician Assistant

## 2020-03-07 ENCOUNTER — Encounter (HOSPITAL_COMMUNITY): Payer: Self-pay | Admitting: Gastroenterology

## 2020-03-07 DIAGNOSIS — T1491XA Suicide attempt, initial encounter: Secondary | ICD-10-CM | POA: Diagnosis not present

## 2020-03-07 DIAGNOSIS — R112 Nausea with vomiting, unspecified: Secondary | ICD-10-CM | POA: Diagnosis not present

## 2020-03-07 DIAGNOSIS — F332 Major depressive disorder, recurrent severe without psychotic features: Secondary | ICD-10-CM | POA: Diagnosis not present

## 2020-03-07 DIAGNOSIS — T50902A Poisoning by unspecified drugs, medicaments and biological substances, intentional self-harm, initial encounter: Secondary | ICD-10-CM | POA: Diagnosis not present

## 2020-03-07 LAB — COMPREHENSIVE METABOLIC PANEL
ALT: 14 U/L (ref 0–44)
AST: 18 U/L (ref 15–41)
Albumin: 3.6 g/dL (ref 3.5–5.0)
Alkaline Phosphatase: 56 U/L (ref 38–126)
Anion gap: 9 (ref 5–15)
BUN: 14 mg/dL (ref 8–23)
CO2: 25 mmol/L (ref 22–32)
Calcium: 9.3 mg/dL (ref 8.9–10.3)
Chloride: 102 mmol/L (ref 98–111)
Creatinine, Ser: 1.04 mg/dL (ref 0.61–1.24)
GFR, Estimated: >60 mL/min (ref >60)
Glucose, Bld: 107 mg/dL — ABNORMAL HIGH (ref 70–99)
Potassium: 3.9 mmol/L (ref 3.5–5.1)
Sodium: 136 mmol/L (ref 135–145)
Total Bilirubin: 1 mg/dL (ref 0.3–1.2)
Total Protein: 6.3 g/dL — ABNORMAL LOW (ref 6.5–8.1)

## 2020-03-07 LAB — CBC
HCT: 36.8 % — ABNORMAL LOW (ref 39.0–52.0)
Hemoglobin: 12.1 g/dL — ABNORMAL LOW (ref 13.0–17.0)
MCH: 28.7 pg (ref 26.0–34.0)
MCHC: 32.9 g/dL (ref 30.0–36.0)
MCV: 87.2 fL (ref 80.0–100.0)
Platelets: 252 10*3/uL (ref 150–400)
RBC: 4.22 MIL/uL (ref 4.22–5.81)
RDW: 14.9 % (ref 11.5–15.5)
WBC: 18.6 10*3/uL — ABNORMAL HIGH (ref 4.0–10.5)
nRBC: 0 % (ref 0.0–0.2)

## 2020-03-07 LAB — HIV ANTIBODY (ROUTINE TESTING W REFLEX): HIV Screen 4th Generation wRfx: NONREACTIVE

## 2020-03-07 LAB — SURGICAL PATHOLOGY

## 2020-03-07 NOTE — Evaluation (Signed)
Clinical/Bedside Swallow Evaluation Patient Details  Name: David Oneill MRN: [id number] Date of Birth: 03-24-1956  Today's Date: 03/07/2020 Time: SLP Start Time (ACUTE ONLY): 1403 SLP Stop Time (ACUTE ONLY): 1402 SLP Time Calculation (min) (ACUTE ONLY): 17 min  Past Medical History:  Past Medical History:  Diagnosis Date  . Chronic back pain   . Sleep apnea   . Suicide attempt ([place]) 02/22/2020   Past Surgical History:  Past Surgical History:  Procedure Laterality Date  . ABLATION OF DYSRHYTHMIC FOCUS     HPI:  Pt is a 64 y.o.male with history of A. fib, on Xarelto, s/p ablation x 2, restrictive lung disease, OSA, HTN,  Large PEs 11/2019 prompting an NSTEMI, started on heparin and subsequently developed a chest hematoma, CLL with baseline white count of 20-30, history of alcohol abuse. He was diagnosed with likely early stages of Alzheimer's versus possible dementia per [facility] and also has  a history of Wernicke's encephalopathy diagnosed early 2020. He presented on 10/5 for evaluation of suicidal intent, homicidal ideation, and drug overdose. Pt slit both wrists (on Xarelto) and  reportedly overdosed on his Oxycodone and Restoril. He fell and hit the back of his head on his daughters porch. CT Head shows  small occipital bleed with subarachnoid extension. CXR 10/15 negative for acute changes. EGD showed esophageal plaques, suspicious for candidiasis, biopsied.  Mild duodenal bulb erythema and mildly congested mucosa in the second and of the duodenum.  No clear cause of nausea and vomiting.   Assessment / Plan / Recommendation Clinical Impression  Pt was seen for bedside swallow evaluation. He reported globus sensation with pills and dry solids, and intermittent severe episodes of coughing with p.o. intake which have resulted in his spitting food on individuals. Oral mechanism exam was [facility] and he presented with adequate dentition. He simultaneously and also sequentially  exhibited effortful swallows, a posterior head tilt, left and right head turns, and held his throat with intake of regular solids and with the initial bolus of thin liquids. Pt stated that this is what he has to do to be able to swallow some foods. It is recommended that his current soft diet be continued and that a modified barium swallow study be conducted to further assess swallow function.  SLP Visit Diagnosis: Dysphagia, unspecified (R13.10)    Aspiration Risk  No limitations    Diet Recommendation Dysphagia 3 (Mech soft);Thin liquid   Liquid Administration via: Cup;Straw Medication Administration: Whole meds with puree Supervision: Patient able to self feed Compensations: Slow rate;Small sips/bites Postural Changes: Seated upright at 90 degrees    Other  Recommendations Oral Care Recommendations: Oral care BID   Follow up Recommendations  (TBD)      Frequency and Duration            Prognosis Prognosis for Safe Diet Advancement: Good Barriers to Reach Goals: Cognitive deficits      Swallow Study   General Date of Onset: 03/06/20 HPI: Pt is a 64 y.o.male with history of A. fib, on Xarelto, s/p ablation x 2, restrictive lung disease, OSA, HTN,  Large PEs 11/2019 prompting an NSTEMI, started on heparin and subsequently developed a chest hematoma, CLL with baseline white count of 20-30, history of alcohol abuse. He was diagnosed with likely early stages of Alzheimer's versus possible dementia per [facility] and also has  a history of Warnicke's encephalopathy diagnosed early 2020. He presented on 10/5 for evaluation of suicidal intent, homicidal ideation, and drug overdose.  Pt slit both wrists (on Xarelto) and  reportedly overdosed on his Oxycodone and Restoril. He fell and hit the back of his head on his daughters porch. CT Head shows  small occipital bleed with subarachnoid extension. CXr 10/15 negative for acute changes.  Type of Study: Bedside Swallow  Evaluation Previous Swallow Assessment: None  Diet Prior to this Study: Dysphagia 3 (soft);Thin liquids Temperature Spikes Noted: No Respiratory Status: Room air History of Recent Intubation: No Behavior/Cognition: Alert;Cooperative;Pleasant mood Oral Cavity Assessment: Within Functional Limits Oral Care Completed by SLP: Yes Oral Cavity - Dentition: Adequate natural dentition Vision: Functional for self-feeding Self-Feeding Abilities: Able to feed self Patient Positioning: Upright in bed;Postural control adequate for testing Baseline Vocal Quality: Normal Volitional Cough: Strong Volitional Swallow: Able to elicit    Oral/Motor/Sensory Function Overall Oral Motor/Sensory Function: Within functional limits   Ice Chips Ice chips: Within functional limits Presentation: Spoon   Thin Liquid Thin Liquid: Within functional limits Presentation: Straw    Nectar Thick Nectar Thick Liquid: Not tested   Honey Thick Honey Thick Liquid: Not tested   Puree Puree: Within functional limits Presentation: Spoon   Solid     Solid: Impaired Pharyngeal Phase Impairments: Cough - Delayed (Effortful swallow with use of multiple head turns)     David Oneill, David Oneill, David Oneill Office number [phone] Pager David Oneill 03/07/2020,4:03 PM

## 2020-03-07 NOTE — Evaluation (Signed)
Speech Language Pathology Evaluation Patient Details Name: David Oneill MRN: [id number] DOB: 1955-11-27 Today's Date: 03/07/2020 Time: 7829-5621 SLP Time Calculation (min) (ACUTE ONLY): 22 min  Problem List:  Patient Active Problem List   Diagnosis Date Noted  . Nausea and vomiting   . Major depressive disorder, recurrent severe without psychotic features ([place]) 02/28/2020  . CLL (chronic lymphocytic leukemia) ([place]) 02/24/2020  . Suicide attempt ([place]) 02/23/2020  . Intentional drug overdose ([place])   . SAH (subarachnoid hemorrhage) ([place]) 02/21/2020   Past Medical History:  Past Medical History:  Diagnosis Date  . Chronic back pain   . Sleep apnea   . Suicide attempt ([place]) 02/22/2020   Past Surgical History:  Past Surgical History:  Procedure Laterality Date  . ABLATION OF DYSRHYTHMIC FOCUS     HPI:  Pt is a 64 y.o.male with history of A. fib, on Xarelto, s/p ablation x 2, restrictive lung disease, OSA, HTN,  Large PEs 11/2019 prompting an NSTEMI, started on heparin and subsequently developed a chest hematoma, CLL with baseline white count of 20-30, history of alcohol abuse. He was diagnosed with likely early stages of Alzheimer's versus possible dementia per [facility] and also has  a history of Wernicke's encephalopathy diagnosed early 2020. He presented on 10/5 for evaluation of suicidal intent, homicidal ideation, and drug overdose. Pt slit both wrists (on Xarelto) and  reportedly overdosed on his Oxycodone and Restoril. He fell and hit the back of his head on his daughters porch. CT Head shows  small occipital bleed with subarachnoid extension. CXR 10/15 negative for acute changes. EGD showed esophageal plaques, suspicious for candidiasis, biopsied.  Mild duodenal bulb erythema and mildly congested mucosa in the second and of the duodenum.  No clear cause of nausea and vomiting.    Assessment / Plan / Recommendation Clinical Impression  Pt participated in  speech/language evaluation. Pt reported memory difficulty since 2011 and had a "memory test" which showed such. Per the pt, he has been on social security disability since them due to these results. The [facility] Mental Status Examination was completed to evaluate the pt's cognitive-linguistic skills. He achieved a score of 27/30 which is within the normal limits of 27 or more out of 30. He exhibited tangential tendencies and mild difficulty in the areas of memory and executive function. However, pt indicated that he believes his performance is at baseline and informal assessment revealed adequate skills for functional tasks. Further acute skilled SLP services are not clinically indicated at this time for cognition, but SLP will follow for dysphagia.     SLP Assessment  SLP Recommendation/Assessment: Patient does not need any further Speech Lanaguage Pathology Services SLP Visit Diagnosis: Cognitive communication deficit (R41.841)    Follow Up Recommendations  None    Frequency and Duration           SLP Evaluation Cognition  Overall Cognitive Status: History of cognitive impairments - at baseline Arousal/Alertness: Awake/alert Orientation Level: Oriented to person;Oriented to place;Oriented to situation Attention: Focused;Sustained Focused Attention: Appears intact Sustained Attention: Appears intact Memory: Impaired Memory Impairment: Retrieval deficit;Decreased recall of new information Awareness: Appears intact Problem Solving: Appears intact Executive Function: Reasoning;Sequencing;Organizing Sequencing: Appears intact (Clock drawing: 4/4) Organizing: Impaired Organizing Impairment: Verbal complex (Backward digit  span: 1/2)       Comprehension  Auditory Comprehension Overall Auditory Comprehension: Appears within functional limits for tasks assessed Yes/No Questions: Within Functional Limits Commands: Within Functional Limits Conversation: Complex     Expression Expression  Primary Mode of Expression: Verbal Verbal Expression Overall Verbal Expression: Appears within functional limits for tasks assessed Initiation: No impairment Level of Generative/Spontaneous Verbalization: Conversation Repetition: No impairment Naming: No impairment Pragmatics: No impairment   Oral / Motor  Oral Motor/Sensory Function Overall Oral Motor/Sensory Function: Within functional limits Motor Speech Overall Motor Speech: Appears within functional limits for tasks assessed Respiration: Within functional limits Phonation: Normal Resonance: Within functional limits Articulation: Within functional limitis Intelligibility: Intelligible Motor Planning: Witnin functional limits   Jisele Price I. [name], [place], [place] Office number [phone] Pager [place] 03/07/2020, 4:25 PM

## 2020-03-07 NOTE — Progress Notes (Signed)
CSW was told by NP Erin Hearing that pt  spoke with representative at [facility] in [place], tried multiple times to speak with admissions coordinator Kenney Houseman, but she works from home and is having obvious issues with her phone as she can't hear me. CSW tried to call the clinic back but each time is told that there is no external phone number for Tonya. CSW also reached out to [facility] to check on the status, was told by Larkin Ina that they never received the fax referral I sent on 10/14. CSW refaxed referral and was told by April in admissions that they can't take pt because of his medical status. When CSW asked what the medical reasons were, April stated the provider declined but didn't give a reason. CSW will continue to follow.

## 2020-03-07 NOTE — Progress Notes (Signed)
[facility] PROGRESS NOTE  David Oneill HEN:[id number] DOB: Sep 26, 1955 DOA: 02/21/2020 PCP: [name])  Status: Inpatient-Remains inpatient appropriate because:Altered mental status, Unsafe d/c plan, IV treatments appropriate due to intensity of illness or inability to take PO and Inpatient level of care appropriate due to severity of illness   Dispo: The patient is from: Home              Anticipated d/c is to: Inpatient Geri Psych unit              Anticipated d/c date is: > 3 days              Patient currently is not medically stable to d/c. 2/2 UNSAFE DC PLAN.  Inability to eat secondary to ongoing severe nausea requiring IV fluids and IV antiemetics.  Code Status: Full Family Communication: Patient only DVT prophylaxis: Xarelto Vaccination status: Fully vaccinated against Covid as of July 8346  HPI: 64 year old male admitted with suicidal intent and presented with drug overdose with oxycodone and Restoril along with wrist lacerations.  Patient initially admitted by PCCM.  CT head was noted to have a small occipital intraparenchymal hemorrhage and subarachnoid extension.  Psychiatry consulted and recommended inpatient psych.  Patient now awaiting [facility] psych placement.  Subjective: States nausea may be slightly better.  Clarified that nausea began prior to Covid injection after eating tacos that did not agree with him.  Objective: Vitals:   03/06/20 2134 03/07/20 0640  BP: (!) 160/80 (!) 141/76  Pulse: 77 77  Resp: 16 18  Temp: 99.1 F (37.3 C) 98.7 F (37.1 C)  SpO2: 100% 97%    Intake/Output Summary (Last 24 hours) at 03/07/2020 1208 Last data filed at 03/07/2020 0845 Gross per 24 hour  Intake 1200 ml  Output 3 ml  Net 1197 ml   Filed Weights   03/05/20 0524 03/06/20 0500 03/07/20 0414  Weight: 103.7 kg 103.2 kg 103 kg    Exam:  Constitutional: NAD, awakens, calm, comfortable Respiratory: clear to auscultation bilaterally, no wheezing, no  crackles. Normal respiratory effort. No accessory muscle use.  Room air Cardiovascular: Irregular rate and rhythm underlying atrial fibrillation, no murmurs / rubs / gallops. No extremity edema.  Abdomen: no tenderness, no masses palpated. No hepatosplenomegaly. Bowel sounds positive.  Musculoskeletal: no clubbing / cyanosis. No joint deformity upper and lower extremities. Good ROM, no contractures. Normal muscle tone.  Skin: no rashes, lesions, ulcers. No induration Neurologic: CN 2-12 grossly intact. Sensation intact, DTR normal. Strength 5/5 x all 4 extremities.  Psychiatric: Alert and oriented x 3.  Flat affect.   Assessment/Plan: Non-aneurysmal small left occipital intraparenchymal hemorrhage with subarachnoid extension/ Acute toxic encephalopathy  -Secondary to drug overdose with oxycodone and Restoril along with trauma in the light of anticoagulant (Xarelto) use -Outpatient geriatric psych documentation patient has a history of polypharmacy and alcohol abuse as well with last alcohol use about 4 years ago -Xarelto reversed with Kcentra in the ED -Neurosurgery consulted; appreciate their recommendations-resumedxarelto 10/11 -Repeat head CT stable  -mental status stable  Reported history of Wernicke's encephalopathy/history of memory loss/chronic insomnia -Documented on outside records; he confirmed that several years ago he had "a bad time of it" at the time this diagnosis was given -Check anemia panel including B12 and folate -We will ask SLP and OT to assist with cognitive evaluation -Has also been followed by [name] psych at [facility] at the [facility]: Differential during evaluation September 2021 included timers dementia,  frontotemporal alcoholic dementia, pseudodementia with depression or possible vascular etiology -September was started on mirtazapine 15 mg at night by [name] psych-given symptoms complicated by ongoing depression and insomnia -MRI of the  brain was ordered at that September visit he was also referred to Dr. Paula Libra with geriatric psychiatry-it does not appear that either MRI was completed nor has patient followed up with outpatient Geri psych [place] review of outside records -Go ahead and check anemia panel regarding B12 and folate, check TSH RPR and HIV.  Currently on oral B12 and thiamine replacement with multiple vitamins.  Suicide attempt - bilateral wrist laceration and intentional drug overdose -psych evaluated and recommended inpatient geri-psych placement -Continue suicide precautions-psychiatry documents high risk for suicide since lived alone prior to admission -SW asked for psych re-consult- Reconsulted on 02/28/2020, and continues to need inpatient psychiatry placement when medically stable -discussed with TOC on 10/14- still searching for geripsych-during chart review from outside facilities patient has previously been evaluated by [name] psychology at [facility] through the [facility] and had previously been referred to outpatient [facility] psychiatric provider Dr. Paula Libra.  Updated TOC re this information. -Recent chest x-ray which unremarkable for infection. CBC /CMP WNL.  EKG showed AF, no acute changes.  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  Recommending continuing Lexapro (had previously been on Cymbalta but had self discontinued this medication prior to September visit with [facility] psychiatrist and was subsequently started on Remeron). -During this hospitalization Remeron and melatonin were discontinued.   -Rozerem was started at bedtime.  Also documented patient has had chronic issues with ongoing insomnia  Chronic diastolic heart  failure/pulmonary hypertension/TR -Currently appears to be euvolemic and compensated -Continue to monitor I's and O's, daily weights -Has been followed by outpatient cardiology for this as well as atrial fibrillation -Echocardiogram 12/31/19 from [facility] instead preserved EF 55 to 60% with mild aortic regurgitation and mild to moderate TR with RVSP 42 mmHg and mild pulmonary hypertension, aortic sinus moderately dilated at 4.9 cm and a moderately dilated ascending aorta 5.0 cm  Chronic atrial fibrillation/flutter -Continue Xarelto -Currently rate controlled on metoprolol -Followed by cardiology and EP at [facility] clinics  Recent Massive PE -Hospitalized in July of this year at [facility] non-STEMI characterized as demand ischemia at same time -Continue Xarelto  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 Joan Mayans at [facility]; patient remains in observational status without any active treatments  Obesity/OSA -BMI 31.33  -Patient will need to follow-up with his PCP on discharge and discuss lifestyle modifications -It appears patient underwent sleep study in the past few months but apparently [employer] denied CPAP  Hypokalemia -Resolved -continue to monitor BMP intermittently   Nausea/possible esophageal candidiasis -Patient continues to complain of nausea without vomiting (states no vomiting since last week) -currently on reglan and PRN antiemetics -Currently able to tolerate his diet -Has not had an endoscopy and states he had a colonoscopy a couple of years ago at [facility] -Gastroenterology consulted -Status post EGD which showed esophageal plaques, suspicious for candidiasis, biopsied.  Mild duodenal bulb erythema and mildly congested mucosa in the second and of the duodenum.  No clear cause of nausea and vomiting found.   -Recommendations are to await pathology, continue  PPI.  Discontinue  famotidine and Reglan.   -Continue Zofran (on scheduled as well as prn for breakthrough).  -Okay to resume Xarelto. -Of note patient has been chronically using hemp flower and CBD oil as well as CBD vaping to assist with nausea symptoms-states has not used marijuana after returning to [place] from [place] neck pain -Followed by Dr. Celedonio Miyamoto at the clinics at [facility] -Recent MRI with mild to moderate degenerative disc disease with mild to severe facet joint arthrosis contributing to multilevel mild to severe neural foraminal stenosis and mild to moderate acquired spinal canal stenosis -Recommendation for PT    Data Reviewed: Basic Metabolic Panel: Recent Labs  Lab 03/02/20 0356 03/04/20 0816 03/07/20 0428  NA 137 137 136  K 4.1 3.9 3.9  CL 102 101 102  CO2 27 25 25   GLUCOSE 99 117* 107*  BUN 19 17 14   CREATININE 1.24 1.24 1.04  CALCIUM 9.3 9.3 9.3  MG  --  2.2  --    Liver Function Tests: Recent Labs  Lab 03/02/20 0356 03/07/20 0428  AST 21 18  ALT 16 14  ALKPHOS 60 56  BILITOT 0.8 1.0  PROT 6.4* 6.3*  ALBUMIN 3.8 3.6   No results for input(s): LIPASE, AMYLASE in the last 168 hours. No results for input(s): AMMONIA in the last 168 hours. CBC: Recent Labs  Lab 03/03/20 0209 03/04/20 0816 03/07/20 0428  WBC 20.4*  --  18.6*  HGB 12.0* 12.4* 12.1*  HCT 38.3* 39.4 36.8*  MCV 90.1  --  87.2  PLT 301  --  252   Cardiac Enzymes: No results for input(s): CKTOTAL, CKMB, CKMBINDEX, TROPONINI in the last 168 hours. BNP (last 3 results) Recent Labs    02/21/20 1144  BNP 359.3*    ProBNP (last 3 results) No results for input(s): PROBNP in the last 8760 hours.  CBG: Recent Labs  Lab 03/06/20 0649  GLUCAP 102*    No results found for this or any previous visit (from the past 240 hour(s)).   Studies: No results found.  Scheduled Meds: . escitalopram  5 mg Oral Daily  . metoprolol tartrate  12.5 mg Oral  BID  . mometasone-formoterol  2 puff Inhalation BID  . multivitamin with minerals  1 tablet Oral Daily  . ondansetron  4 mg Oral TID WC & HS  . pantoprazole  40 mg Oral BID  . ramelteon  8 mg Oral QHS  . rivaroxaban  20 mg Oral QHS  . thiamine  100 mg Oral Daily  . cyanocobalamin  1,000 mcg Oral Daily   Continuous Infusions:  Active Problems:   SAH (subarachnoid hemorrhage) (HCC)   Suicide attempt ([place])   Intentional drug overdose ([place])   CLL (chronic lymphocytic leukemia) (HCC)   Major depressive disorder, recurrent severe without psychotic features ([place])   Nausea and vomiting   Consultants: PCCM Psychiatry Gastroenterology  Procedures:  10/5 laceration repair in the ER  10/19 EGD  Antibiotics: Anti-infectives (From admission, onward)   None        Time spent: [place] ANP  [facility] Pager [phone]. If 7PM-7AM, please contact night-coverage at [email or web address] 03/07/2020, 12:08 PM  LOS: 15 days          .

## 2020-03-07 NOTE — Evaluation (Signed)
Occupational Therapy Evaluation Patient Details Name: David Oneill MRN: [id number] DOB: 10-03-1955 Today's Date: 03/07/2020    History of Present Illness David Oneill is a 64 y.o. male patient admitted after presented after drug overdose of oxycodone and Restoril, lacerations, bilateral wrist. Patient had EGD 10/19 with ~4 month hx of N/V   Clinical Impression   Patient at baseline with self care, no acute OT needs identified at this time. Will sign off.    Follow Up Recommendations  No OT follow up    Equipment Recommendations  None recommended by OT       Precautions / Restrictions Precautions Precautions: None Restrictions Weight Bearing Restrictions: No      Mobility Bed Mobility Overal bed mobility: Independent                  Transfers Overall transfer level: Independent                    Balance Overall balance assessment: Independent                                         ADL either performed or assessed with clinical judgement   ADL Overall ADL's : Independent                                       General ADL Comments: patient able to ambulate in room, complete self care tasks without assistance                  Pertinent Vitals/Pain Pain Assessment: Faces Faces Pain Scale: Hurts little more Pain Location: chronic back pain Pain Descriptors / Indicators: Aching Pain Intervention(s): Monitored during session     Hand Dominance Right   Extremity/Trunk Assessment Upper Extremity Assessment Upper Extremity Assessment: Overall WFL for tasks assessed   Lower Extremity Assessment Lower Extremity Assessment: Defer to PT evaluation   Cervical / Trunk Assessment Cervical / Trunk Assessment: Normal   Communication Communication Communication: No difficulties   Cognition Arousal/Alertness: Awake/alert Behavior During Therapy: WFL for tasks assessed/performed Overall Cognitive Status:  History of cognitive impairments - at baseline                                 General Comments: patient A/Ox4, follows directions, tangential               Home Living Family/patient expects to be discharged to:: Other (Comment) David Oneill)                                        Prior Functioning/Environment Level of Independence: Independent                 OT Problem List: Decreased cognition       AM-PAC OT "6 Clicks" Daily Activity     Outcome Measure Help from another person eating meals?: None Help from another person taking care of personal grooming?: None Help from another person toileting, which includes using toliet, bedpan, or urinal?: None Help from another person bathing (including washing, rinsing, drying)?: None Help from another person to put on and taking off regular upper body clothing?:  None Help from another person to put on and taking off regular lower body clothing?: None 6 Click Score: 24   End of Session  Activity Tolerance: Patient tolerated treatment well Patient left: in bed;with nursing/sitter in room  OT Visit Diagnosis: Other symptoms and signs involving cognitive function                Time: 7939-0300 OT Time Calculation (min): 14 min Charges:  OT General Charges $OT Visit: 1 Visit OT Evaluation $OT Eval Low Complexity: [place] OT OT pager: [place] 03/07/2020, 12:26 PM

## 2020-03-07 NOTE — Anesthesia Postprocedure Evaluation (Signed)
Anesthesia Post Note  Patient: David Oneill  Procedure(s) Performed: ESOPHAGOGASTRODUODENOSCOPY (EGD) (N/A ) BIOPSY     Patient location during evaluation: Endoscopy Anesthesia Type: MAC Level of consciousness: awake and alert Pain management: pain level controlled Vital Signs Assessment: post-procedure vital signs reviewed and stable Respiratory status: spontaneous breathing, nonlabored ventilation, respiratory function stable and patient connected to nasal cannula oxygen Cardiovascular status: blood pressure returned to baseline and stable Postop Assessment: no apparent nausea or vomiting Anesthetic complications: no   No complications documented.  Last Vitals:  Vitals:   03/06/20 2134 03/07/20 0640  BP: (!) 160/80 (!) 141/76  Pulse: 77 77  Resp: 16 18  Temp: 37.3 C 37.1 C  SpO2: 100% 97%    Last Pain:  Vitals:   03/07/20 0640  TempSrc: Oral  PainSc:                  [name]

## 2020-03-08 ENCOUNTER — Inpatient Hospital Stay (HOSPITAL_COMMUNITY): Payer: Medicare Other

## 2020-03-08 ENCOUNTER — Encounter: Payer: Self-pay | Admitting: Gastroenterology

## 2020-03-08 DIAGNOSIS — F332 Major depressive disorder, recurrent severe without psychotic features: Secondary | ICD-10-CM | POA: Diagnosis not present

## 2020-03-08 DIAGNOSIS — R110 Nausea: Secondary | ICD-10-CM

## 2020-03-08 DIAGNOSIS — T1491XA Suicide attempt, initial encounter: Secondary | ICD-10-CM | POA: Diagnosis not present

## 2020-03-08 DIAGNOSIS — F1027 Alcohol dependence with alcohol-induced persisting dementia: Secondary | ICD-10-CM

## 2020-03-08 DIAGNOSIS — T50902A Poisoning by unspecified drugs, medicaments and biological substances, intentional self-harm, initial encounter: Secondary | ICD-10-CM | POA: Diagnosis not present

## 2020-03-08 LAB — RAPID HIV SCREEN (HIV 1/2 AB+AG)
HIV 1/2 Antibodies: NONREACTIVE
HIV-1 P24 Antigen - HIV24: NONREACTIVE

## 2020-03-08 LAB — TSH: TSH: 1.04 u[IU]/mL (ref 0.350–4.500)

## 2020-03-08 LAB — RETICULOCYTES
Immature Retic Fract: 5.4 % (ref 2.3–15.9)
RBC.: 4.31 MIL/uL (ref 4.22–5.81)
Retic Count, Absolute: 71.5 10*3/uL (ref 19.0–186.0)
Retic Ct Pct: 1.7 % (ref 0.4–3.1)

## 2020-03-08 LAB — FOLATE: Folate: 28.4 ng/mL (ref >5.9)

## 2020-03-08 LAB — IRON AND TIBC
Iron: 38 ug/dL — ABNORMAL LOW (ref 45–182)
Saturation Ratios: 11 % — ABNORMAL LOW (ref 17.9–39.5)
TIBC: 342 ug/dL (ref 250–450)
UIBC: 304 ug/dL

## 2020-03-08 LAB — VITAMIN B12: Vitamin B-12: 1766 pg/mL — ABNORMAL HIGH (ref 180–914)

## 2020-03-08 LAB — FERRITIN: Ferritin: 217 ng/mL (ref 24–336)

## 2020-03-08 LAB — RPR: RPR Ser Ql: NONREACTIVE

## 2020-03-08 MED ORDER — ENSURE ENLIVE PO LIQD
237.0000 mL | Freq: Two times a day (BID) | ORAL | Status: DC
  Administered 2020-03-08 – 2020-03-27 (×33): 237 mL via ORAL

## 2020-03-08 MED ORDER — SODIUM CHLORIDE 0.9 % IV SOLN
25.0000 mg | Freq: Once | INTRAVENOUS | Status: AC
  Administered 2020-03-08: 25 mg via INTRAVENOUS
  Filled 2020-03-08 (×2): qty 0.5

## 2020-03-08 MED ORDER — SODIUM CHLORIDE 0.9 % IV SOLN
1000.0000 mg | Freq: Once | INTRAVENOUS | Status: AC
  Administered 2020-03-08: 1000 mg via INTRAVENOUS
  Filled 2020-03-08: qty 20

## 2020-03-08 NOTE — Progress Notes (Signed)
Modified Barium Swallow Progress Note  Patient Details  Name: David Oneill MRN: [id number] Date of Birth: [birth year]  Today's Date: 03/08/2020  Modified Barium Swallow completed.  Full report located under Chart Review in the Imaging Section.  Brief recommendations include the following:  Clinical Impression  Pt was seen in radiology suite for modified barium swallow study. Trials of puree solids, regular texture solids, mixed consistency boluses, a 51mm barium tablet, and thin liquids via straw were administered. [name] oropharyngeal swallow mechanism was within functional limits. Similar behaviors, such as an exaggerated posterior head tilt, were noted during the study without evidence of physiological necessity. Pt does have cervical osteophytes at C4 and C5 which push the posterior pharyngeal wall anteriorly, but did not significantly impact bolus flow. The possibility of pt's report of foods "not going down" being due to the sensation of boluses passing the cervical osteophytes is considered. [name] diet may be advanced to regular texture solids per pt's preference and GI's recommendations. Pt was educated regarding results and recommendations and verbalized understanding. Further skilled SLP services are not clinically indicated at this time.    Swallow Evaluation Recommendations       SLP Diet Recommendations: Regular solids;Thin liquid   Liquid Administration via: Cup;Straw   Medication Administration: Whole meds with liquid   Supervision: Patient able to self feed       Postural Changes: Seated upright at 90 degrees   Oral Care Recommendations: Oral care BID      David Oneill, [place], [place] Office number [phone] Pager [place] 03/08/2020,2:16 PM

## 2020-03-08 NOTE — Progress Notes (Addendum)
[facility] PROGRESS NOTE  Hager Compston FBP:[id number] DOB: Sep 27, 1955 DOA: 02/21/2020 PCP: [name])    10/21: Yoshio sitting up in chair after performing ADLs  Status: Inpatient-Remains inpatient appropriate because:Altered mental status, Unsafe d/c plan, IV treatments appropriate due to intensity of illness or inability to take PO and Inpatient level of care appropriate due to severity of illness   Dispo: The patient is from: Home              Anticipated d/c is to: Inpatient Geri Psych unit              Anticipated d/c date is: > 3 days              Patient currently is medically stable to d/c.  [place].  Needs Geripsych placement 2/2 presenting with suicide attempt  Code Status: Full Family Communication: Patient only DVT prophylaxis: Xarelto Vaccination status: Fully vaccinated against Covid as of July 5124  HPI: 64 year old male admitted with suicidal intent and presented with drug overdose with oxycodone and Restoril along with wrist lacerations.  Patient initially admitted by PCCM.  CT head was noted to have a small occipital intraparenchymal hemorrhage and subarachnoid extension.  Psychiatry consulted and recommended inpatient psych.  Patient now awaiting [facility] psych placement.  Subjective: Patient alert and sitting up in chair by bed.  Reports only mild nausea and per nursing staff patient completed 100% of tray this morning.  Objective: Vitals:   03/07/20 2215 03/08/20 0706  BP: 126/78 134/75  Pulse: 97 64  Resp:  18  Temp:  97.9 F (36.6 C)  SpO2:  99%    Intake/Output Summary (Last 24 hours) at 03/08/2020 1158 Last data filed at 03/08/2020 1019 Gross per 24 hour  Intake 1600 ml  Output 1550 ml  Net 50 ml   Filed Weights   03/06/20 0500 03/07/20 0414 03/08/20 0706  Weight: 103.2 kg 103 kg 101.6 kg    Exam:  Constitutional: NAD, awakens, calm, comfortable Respiratory: clear to auscultation bilaterally, no wheezing, no  crackles. Normal respiratory effort. No accessory muscle use.  Room air Cardiovascular: Irregular rate and rhythm underlying atrial fibrillation, no murmurs / rubs / gallops. No extremity edema.  Abdomen: no tenderness, no masses palpated. No hepatosplenomegaly. Bowel sounds positive.  Musculoskeletal: no clubbing / cyanosis. No joint deformity upper and lower extremities. Good ROM, no contractures. Normal muscle tone.  Skin: no rashes, lesions, ulcers. No induration Neurologic: CN 2-12 grossly intact. Sensation intact, DTR normal. Strength 5/5 x all 4 extremities.  Psychiatric: Alert and oriented x 3.  Flat affect.   Assessment/Plan: Non-aneurysmal small left occipital intraparenchymal hemorrhage with subarachnoid extension/ Acute toxic encephalopathy  -Secondary to drug overdose with oxycodone and Restoril along with trauma in the light of anticoagulant (Xarelto) use -Outpatient geriatric psych documentation patient has a history of polypharmacy and alcohol abuse as well with last alcohol use about 4 years ago -Xarelto reversed with Kcentra in the ED -Neurosurgery consulted; appreciate their recommendations-resumedxarelto 10/11 -Repeat head CT stable  -mental status stable  Reported history of Wernicke's encephalopathy/history of memory loss/chronic insomnia -Documented on outside records; he confirmed that several years ago he had "a bad time of it" at the time this diagnosis was given -Check anemia panel including B12 and folate -We will ask SLP and OT to assist with cognitive evaluation -Has also been followed by [name] psych at [facility] the [facility]: Differential during evaluation September 2021 included timers  dementia, frontotemporal alcoholic dementia, pseudodementia with depression or possible vascular etiology -September was started on mirtazapine 15 mg at night by [name] psych-given symptoms complicated by ongoing depression and insomnia -MRI of the  brain was ordered at that September visit he was also referred to Dr. Paula Libra with geriatric psychiatry-it does not appear that eMRI was completed nor has patient followed up with outpatient Geri psych [place] review of outside records -TSH, RPR, and HIV negative. Iron low and will be replaced.  Documentation from [place]: Family reports that Patient has multiple repeated admissions for altered mental status with concerns of Wernicke's encephalopathy Memory impairment began about 3 years ago has progressively gotten worse. Could have been going on longer, but Wyatt Mage moved in with him in 2019. Has progressively become worse. Steady downhill progression. Alcohol abuse until about 3 to 4 years ago. Would drink 12 pck beer daily. abstinent for about 1 year.  Patient has difficulty remembering names, familiar streets, list of items, behavioral sleep disturbances, incoherent thinking, blank look spells when thiamine is low, personality changes, hurtful comments, and changes in speech. Holds med sentence without stuttering frequently Struggles to name things  States he sleeps all of the time. Visual hallucinations worse since rehab. Unclear when they started. Stares at stuff a lot. Fights him to take a bath. Only baths once a week. Prior she thinks he would have gone weeks without. He also has difficulty thinking for himself and making decisions. He gets angry and emotional/begins to cry when irritated. Threatens to kill family. Threatens to hit daughter/kids. Confusion triggers his anger. Says inappropriate things to everyone. Incomplete thought processes and abruptly stops talking. Repeated conversations. Has not been driving Wants to spend money all of the time on cars  Pertinent ROS:  Falls: yes; 3-4 months ago. Mechanical. Passed out 2x's 2 weeks ago prior to hospitalizations. Unwitnessed. No head injury. Gait change: yes; stumbles a lot. Thinks that he is  dizzy. Incontinence: no; urinary incontinent; bowel continent;; wears depends Tremor: no; does tap Right hand on leg frequently REM sleep behaviors: no Hallucinations: yes; see's lights changing frequently. Ceiling looks like a lava lamp.  Personality changes: yes; tells complete strangers inappropriate information at the farmers market, etc History of head trauma with loss of consciousness: no History of or current EtOH abuse: yes History of stroke: no   Suicide attempt - bilateral wrist laceration and intentional drug overdose -psych evaluated and recommended inpatient geri-psych placement -Continue suicide precautions-psychiatry documents high risk for suicide since lived alone prior to admission -SW asked for psych re-consult- Reconsulted on 02/28/2020, and continues to need inpatient psychiatry placement when medically stable -discussed with TOC on 10/14- still searching for geripsych -Above regarding previous outpatient evaluation -Recent chest x-ray which unremarkable for infection. CBC /CMP WNL.  EKG showed AF, no acute changes.  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  Recommending continuing Lexapro (had previously been on Cymbalta but had self discontinued this medication prior to September visit with [facility] psychiatrist and was subsequently started on Remeron). -During this hospitalization Remeron and melatonin were discontinued.   -Rozerem was started at bedtime.  Also documented patient has had chronic issues with ongoing insomnia  ??  Dysphagia -A weighted by SLP recommendation diet with thin liquids  Protein calorie malnutrition Nutrition  Status: Nutrition Problem: Inadequate oral intake Etiology: decreased  appetite Signs/Symptoms: meal completion < 50% Interventions: Ensure Enlive (each supplement provides 350kcal and 20 grams of protein), MVI  Iron deficiency anemia -Likely related to poor nutrition in context of recurrent nausea since June -Iron 38-given recent issues with nausea would likely not tolerate oral replacement so we will give one-time dose of IV iron 1000 mg with test dose prior to administration  Chronic diastolic heart failure/pulmonary hypertension/TR -Currently appears to be euvolemic and compensated -Continue to monitor I's and O's, daily weights -Has been followed by outpatient cardiology for this as well as atrial fibrillation -Echocardiogram 12/31/19 from [facility] instead preserved EF 55 to 60% with mild aortic regurgitation and mild to moderate TR with RVSP 42 mmHg and mild pulmonary hypertension, aortic sinus moderately dilated at 4.9 cm and a moderately dilated ascending aorta 5.0 cm  Chronic atrial fibrillation/flutter -Continue Xarelto -Currently rate controlled on metoprolol -Followed by cardiology and EP at [facility] clinics  Recent Massive PE -Hospitalized in July of this year at [facility] non-STEMI characterized as demand ischemia at same time -Continue Xarelto  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 Joan Mayans at [facility]; patient remains in observational status without any active treatments  Obesity/OSA -BMI 31.33  -Patient will need to follow-up with his PCP on discharge and discuss lifestyle modifications -It appears patient underwent sleep study in the past few months but apparently [employer] denied CPAP  Hypokalemia -Resolved -continue to monitor BMP intermittently   Nausea/possible esophageal candidiasis -Patient continues to complain of nausea without vomiting (states no vomiting since last week) -In review of outside records patient has been  having these symptoms since June 2021 -Tolerating dysphagia 3 diet -Gastroenterology consulted -Status post EGD which showed esophageal plaques, suspicious for candidiasis, biopsied.  Mild duodenal bulb erythema and mildly congested mucosa in the second and of the duodenum.  No clear cause of nausea and vomiting found.   -Recommendations are to await pathology, continue PPI.  Discontinue famotidine and Reglan.   -Continue Zofran (on scheduled as well as prn for breakthrough). -GI no reason to continue to hold anticoagulation -Of note patient has been chronically using hemp flower and CBD oil as well as CBD vaping to assist with nausea symptoms-states has not used marijuana after returning to [place] from [place] neck pain -Followed by Dr. Celedonio Miyamoto at the clinics at [facility] -Recent MRI with mild to moderate degenerative disc disease with mild to severe facet joint arthrosis contributing to multilevel mild to severe neural foraminal stenosis and mild to moderate acquired spinal canal stenosis -Recommendation for PT    Data Reviewed: Basic Metabolic Panel: Recent Labs  Lab 03/02/20 0356 03/04/20 0816 03/07/20 0428  NA 137 137 136  K 4.1 3.9 3.9  CL 102 101 102  CO2 27 25 25   GLUCOSE 99 117* 107*  BUN 19 17 14   CREATININE 1.24 1.24 1.04  CALCIUM 9.3 9.3 9.3  MG  --  2.2  --    Liver Function Tests: Recent Labs  Lab 03/02/20 0356 03/07/20 0428  AST 21 18  ALT 16 14  ALKPHOS 60 56  BILITOT 0.8 1.0  PROT 6.4* 6.3*  ALBUMIN 3.8 3.6   No results for input(s): LIPASE, AMYLASE in the last 168 hours. No results for input(s): AMMONIA in the last 168 hours. CBC: Recent Labs  Lab 03/03/20 0209 03/04/20 0816 03/07/20  0428  WBC 20.4*  --  18.6*  HGB 12.0* 12.4* 12.1*  HCT 38.3* 39.4 36.8*  MCV 90.1  --  87.2  PLT 301  --  252   Cardiac Enzymes: No results for input(s): CKTOTAL, CKMB, CKMBINDEX, TROPONINI in the last 168  hours. BNP (last 3 results) Recent Labs    02/21/20 1144  BNP 359.3*    ProBNP (last 3 results) No results for input(s): PROBNP in the last 8760 hours.  CBG: Recent Labs  Lab 03/06/20 0649  GLUCAP 102*    No results found for this or any previous visit (from the past 240 hour(s)).   Studies: No results found.  Scheduled Meds: . escitalopram  5 mg Oral Daily  . feeding supplement  237 mL Oral BID BM  . metoprolol tartrate  12.5 mg Oral BID  . mometasone-formoterol  2 puff Inhalation BID  . multivitamin with minerals  1 tablet Oral Daily  . ondansetron  4 mg Oral TID WC & HS  . pantoprazole  40 mg Oral BID  . ramelteon  8 mg Oral QHS  . rivaroxaban  20 mg Oral QHS  . thiamine  100 mg Oral Daily  . cyanocobalamin  1,000 mcg Oral Daily   Continuous Infusions: . iron dextran (INFED/DEXFERRUM) infusion     Followed by  . iron dextran (INFED/DEXFERRUM) infusion      Active Problems:   SAH (subarachnoid hemorrhage) (HCC)   Suicide attempt ([place])   Intentional drug overdose ([place])   CLL (chronic lymphocytic leukemia) (HCC)   Major depressive disorder, recurrent severe without psychotic features ([place])   Nausea and vomiting   Consultants: PCCM Psychiatry Gastroenterology  Procedures:  10/5 laceration repair in the ER  10/19 EGD  Antibiotics: Anti-infectives (From admission, onward)   None       Time spent: [place] ANP  [facility] Pager [phone]. If 7PM-7AM, please contact night-coverage at [email or web address] 03/08/2020, 11:58 AM  LOS: 16 days          .

## 2020-03-09 DIAGNOSIS — S61511D Laceration without foreign body of right wrist, subsequent encounter: Secondary | ICD-10-CM

## 2020-03-09 DIAGNOSIS — T1491XA Suicide attempt, initial encounter: Secondary | ICD-10-CM | POA: Diagnosis not present

## 2020-03-09 DIAGNOSIS — S61512D Laceration without foreign body of left wrist, subsequent encounter: Secondary | ICD-10-CM

## 2020-03-09 DIAGNOSIS — T50902A Poisoning by unspecified drugs, medicaments and biological substances, intentional self-harm, initial encounter: Secondary | ICD-10-CM | POA: Diagnosis not present

## 2020-03-09 MED ORDER — ESCITALOPRAM OXALATE 10 MG PO TABS
10.0000 mg | ORAL_TABLET | Freq: Every day | ORAL | Status: DC
  Administered 2020-03-10 – 2020-03-14 (×5): 10 mg via ORAL
  Filled 2020-03-09 (×5): qty 1

## 2020-03-09 MED ORDER — ROSUVASTATIN CALCIUM 20 MG PO TABS
40.0000 mg | ORAL_TABLET | Freq: Every day | ORAL | Status: DC
  Administered 2020-03-09 – 2020-03-27 (×19): 40 mg via ORAL
  Filled 2020-03-09 (×18): qty 2

## 2020-03-09 MED ORDER — MOMETASONE FURO-FORMOTEROL FUM 100-5 MCG/ACT IN AERO: 2.0000 | INHALATION_SPRAY | Freq: Two times a day (BID) | RESPIRATORY_TRACT | Status: DC

## 2020-03-09 MED ORDER — TIZANIDINE HCL 4 MG PO TABS
4.0000 mg | ORAL_TABLET | Freq: Three times a day (TID) | ORAL | Status: DC | PRN
  Administered 2020-03-09 – 2020-03-27 (×48): 4 mg via ORAL
  Filled 2020-03-09 (×48): qty 1

## 2020-03-09 NOTE — Progress Notes (Signed)
[facility] PROGRESS NOTE  David Oneill IRS:[id number] DOB: 1956/01/19 DOA: 02/21/2020 PCP: [name])    10/21: [name] sitting up in chair after performing ADLs  Status: Inpatient-Remains inpatient appropriate because:Altered mental status, Unsafe d/c plan, IV treatments appropriate due to intensity of illness or inability to take PO and Inpatient level of care appropriate due to severity of illness   Dispo: The patient is from: Home              Anticipated d/c is to: Inpatient Geri Psych unit              Anticipated d/c date is: > 3 days              Patient currently is medically stable to d/c.  [name].  Needs Geripsych placement 2/2 presenting with suicide attempt  Code Status: Full Family Communication: Patient only DVT prophylaxis: Xarelto Vaccination status: Fully vaccinated against Covid as of July 7120  HPI: 64 year old male admitted with suicidal intent and presented with drug overdose with oxycodone and Restoril along with wrist lacerations.  Patient initially admitted by PCCM.  CT head was noted to have a small occipital intraparenchymal hemorrhage and subarachnoid extension.  Psychiatry consulted and recommended inpatient psych.  Patient now awaiting [facility] psych placement.  Subjective: Alert.  Had previously complained of ongoing musculoskeletal pain and notified attending RN that he had been on Zanaflex prior to admission.  Subsequently this was resumed and patient reporting improvement in discomfort.  Objective: Vitals:   03/08/20 2143 03/09/20 0500  BP: 139/77 136/72  Pulse: 88 82  Resp: 18 18  Temp: 98.5 F (36.9 C) 98.1 F (36.7 C)  SpO2: 100% 100%    Intake/Output Summary (Last 24 hours) at 03/09/2020 1244 Last data filed at 03/09/2020 0500 Gross per 24 hour  Intake 250.53 ml  Output 1175 ml  Net -924.47 ml   Filed Weights   03/07/20 0414 03/08/20 0706 03/09/20 0500  Weight: 103 kg 101.6 kg 102 kg     Exam: Constitutional: NAD, awakens, calm, comfortable Respiratory: clear to auscultation bilaterally, no wheezing, no crackles. Normal respiratory effort. No accessory muscle use.  Room air Cardiovascular: Irregular rate and rhythm underlying atrial fibrillation, no murmurs / rubs / gallops. No extremity edema.  Abdomen: no tenderness, no masses palpated. No hepatosplenomegaly. Bowel sounds positive.  Musculoskeletal: no clubbing / cyanosis. No joint deformity upper and lower extremities. Good ROM, no contractures. Normal muscle tone.  Skin: no rashes, lesions, ulcers. No induration Neurologic: CN 2-12 grossly intact. Sensation intact, DTR normal. Strength 5/5 x all 4 extremities.  Psychiatric: Alert and oriented x 3.  Flat affect.   Assessment/Plan: Non-aneurysmal small left occipital intraparenchymal hemorrhage with subarachnoid extension/ Acute toxic encephalopathy  -Secondary to drug overdose with oxycodone and Restoril along with trauma in the light of anticoagulant (Xarelto) use -Outpatient geriatric psych documentation patient has a history of polypharmacy and alcohol abuse as well with last alcohol use about 4 years ago -Xarelto reversed with Kcentra in the ED -Neurosurgery consulted;resumedxarelto 10/11 -Repeat head CT stable   Reported history of Wernicke's encephalopathy/history of memory loss/chronic insomnia -Documented on outside records; he confirmed that several years ago he had "a bad time of it" at the time this diagnosis was given -Check anemia panel including B12 and folate -We will ask SLP and OT to assist with cognitive evaluation -Has also been followed by [name] psych at [facility] at [facility]: Differential during evaluation  September 2021 included timers dementia, frontotemporal alcoholic dementia, pseudodementia with depression or possible vascular etiology -September was started on mirtazapine 15 mg at night by [name] psych-given  symptoms complicated by ongoing depression and insomnia -MRI of the brain was ordered at that September visit he was also referred to Dr. Paula Libra with geriatric psychiatry-it does not appear that eMRI was completed nor has patient followed up with outpatient Geri psych [place] review of outside records -TSH, RPR, and HIV negative. Iron low and will be replaced.  Documentation from [place]: Family reports that Patient has multiple repeated admissions for altered mental status with concerns of Wernicke's encephalopathy Memory impairment began about 3 years ago has progressively gotten worse. Could have been going on longer, but Wyatt Mage moved in with him in 2019. Has progressively become worse. Steady downhill progression. Alcohol abuse until about 3 to 4 years ago. Would drink 12 pck beer daily. abstinent for about 1 year.  Patient has difficulty remembering names, familiar streets, list of items, behavioral sleep disturbances, incoherent thinking, blank look spells when thiamine is low, personality changes, hurtful comments, and changes in speech. Holds med sentence without stuttering frequently Struggles to name things  States he sleeps all of the time. Visual hallucinations worse since rehab. Unclear when they started. Stares at stuff a lot. Fights him to take a bath. Only baths once a week. Prior she thinks he would have gone weeks without. He also has difficulty thinking for himself and making decisions. He gets angry and emotional/begins to cry when irritated. Threatens to kill family. Threatens to hit daughter/kids. Confusion triggers his anger. Says inappropriate things to everyone. Incomplete thought processes and abruptly stops talking. Repeated conversations. Has not been driving Wants to spend money all of the time on cars  Pertinent ROS:  Falls: yes; 3-4 months ago. Mechanical. Passed out 2x's 2 weeks ago prior to hospitalizations. Unwitnessed. No head  injury. Gait change: yes; stumbles a lot. Thinks that he is dizzy. Incontinence: no; urinary incontinent; bowel continent;; wears depends Tremor: no; does tap Right hand on leg frequently REM sleep behaviors: no Hallucinations: yes; see's lights changing frequently. Ceiling looks like a lava lamp.  Personality changes: yes; tells complete strangers inappropriate information at the farmers market, etc History of head trauma with loss of consciousness: no History of or current EtOH abuse: yes History of stroke: no   Suicide attempt - bilateral wrist laceration and intentional drug overdose -psych evaluated and recommended inpatient geri-psych placement -Continue suicide precautions-psychiatry documents high risk for suicide since lived alone prior to admission -SW asked for psych re-consult- Reconsulted on 02/28/2020, and continues to need inpatient psychiatry placement when medically stable -discussed with TOC on 10/14- still searching for geripsych -Above regarding previous outpatient evaluation -Recent chest x-ray which unremarkable for infection. CBC /CMP WNL.  EKG showed AF, no acute changes.  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  Recommending continuing Lexapro (had previously been on Cymbalta but had self discontinued this medication prior to September visit with [facility] psychiatrist and was subsequently started on Remeron). -During this hospitalization Remeron and melatonin were discontinued.   -Rozerem was started at bedtime.  Also documented patient has had chronic issues with ongoing insomnia  ??  Dysphagia -Has been evaluated by SLP - MBSS 10/21 with recommendation  for regular diet and thin liquids a no signs of  dysphagia  Protein calorie malnutrition Nutrition Status: Nutrition Problem: Inadequate oral intake Etiology: decreased appetite Signs/Symptoms: meal completion < 50% Interventions: Ensure Enlive (each supplement provides 350kcal and 20 grams of protein), MVI  Iron deficiency anemia -Likely related to poor nutrition in context of recurrent nausea since June -Iron 38 and was given 1000 mg of IV iron on 47/82  Chronic diastolic heart failure/pulmonary hypertension/TR -Currently appears to be euvolemic and compensated -Continue to monitor I's and O's, daily weights -Has been followed by outpatient cardiology for this as well as atrial fibrillation -Echocardiogram 12/31/19 from [facility] instead preserved EF 55 to 60% with mild aortic regurgitation and mild to moderate TR with RVSP 42 mmHg and mild pulmonary hypertension, aortic sinus moderately dilated at 4.9 cm and a moderately dilated ascending aorta 5.0 cm  Chronic atrial fibrillation/flutter -Continue Xarelto -Currently rate controlled on metoprolol -Followed by cardiology and EP at [facility] clinics  Recent Massive PE -Hospitalized in July of this year at [facility] non-STEMI characterized as demand ischemia at same time -Continue Xarelto  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 Joan Mayans at [facility]; patient remains in observational status without any active treatments  Obesity/OSA -BMI 31.33  -Patient will need to follow-up with his PCP on discharge and discuss lifestyle modifications -It appears patient underwent sleep study in the past few months but apparently [employer] denied CPAP  Hypokalemia -Resolved -continue to monitor BMP intermittently   Nausea/possible esophageal candidiasis -In review of outside records patient has been having these symptoms since June 2021 -Tolerating get her diet without  nausea -Gastroenterology consulted -s/p EGD which showed esophageal plaques, suspicious for candidiasis pathology negative. Mild duodenal bulb erythema and mildly congested mucosa in the second end of the duodenum pathology demonstrating acute inflammation.  -Continue PPI.  Discontinued famotidine and Reglan.   -Continue Zofran (on scheduled as well as prn for breakthrough). -Of note patient has been chronically using hemp flower and CBD oil as well as CBD vaping to assist with nausea symptoms-states has not used marijuana after returning to [place] from [place] neck pain -Followed by Dr. Celedonio Miyamoto at the clinics at [facility] -Recent MRI with mild to moderate degenerative disc disease with mild to severe facet joint arthrosis contributing to multilevel mild to severe neural foraminal stenosis and mild to moderate acquired spinal canal stenosis -Recommendation for PT    Data Reviewed: Basic Metabolic Panel: Recent Labs  Lab 03/04/20 0816 03/07/20 0428  NA 137 136  K 3.9 3.9  CL 101 102  CO2 25 25  GLUCOSE 117* 107*  BUN 17 14  CREATININE 1.24 1.04  CALCIUM 9.3 9.3  MG 2.2  --    Liver Function Tests: Recent Labs  Lab 03/07/20 0428  AST 18  ALT 14  ALKPHOS 56  BILITOT 1.0  PROT 6.3*  ALBUMIN 3.6   No results for input(s): LIPASE, AMYLASE in the last 168 hours. No results for input(s): AMMONIA in the last 168 hours. CBC: Recent Labs  Lab 03/03/20 0209 03/04/20 0816 03/07/20 0428  WBC 20.4*  --  18.6*  HGB 12.0* 12.4* 12.1*  HCT 38.3* 39.4 36.8*  MCV 90.1  --  87.2  PLT 301  --  252   Cardiac Enzymes: No results for input(s): CKTOTAL, CKMB, CKMBINDEX, TROPONINI in the last 168 hours. BNP (last 3 results) Recent  Labs    02/21/20 1144  BNP 359.3*    ProBNP (last 3 results) No results for input(s): PROBNP in the last 8760 hours.  CBG: Recent Labs  Lab 03/06/20 0649  GLUCAP 102*    No results found for  this or any previous visit (from the past 240 hour(s)).   Studies: DG Swallowing Func-Speech Pathology  Result Date: 03/08/2020 Objective Swallowing Evaluation: Type of Study: MBS-Modified Barium Swallow Study  Patient Details Name: Kayode Petion MRN: [id number] Date of Birth: 1955-08-15 Today's Date: 03/08/2020 Time: SLP Start Time (ACUTE ONLY): 1036 -SLP Stop Time (ACUTE ONLY): 1052 SLP Time Calculation (min) (ACUTE ONLY): 16 min Past Medical History: Past Medical History: Diagnosis Date . Chronic back pain  . Sleep apnea  . Suicide attempt ([place]) 02/22/2020 Past Surgical History: Past Surgical History: Procedure Laterality Date . ABLATION OF DYSRHYTHMIC FOCUS   . BIOPSY  03/06/2020  Procedure: BIOPSY;  Surgeon: Ladene Artist, MD;  Location: [facility] ENDOSCOPY;  Service: Gastroenterology;; . ESOPHAGOGASTRODUODENOSCOPY N/A 03/06/2020  Procedure: ESOPHAGOGASTRODUODENOSCOPY (EGD);  Surgeon: Ladene Artist, MD;  Location: [place];  Service: Gastroenterology;  Laterality: N/A; HPI: Pt is a 64 y.o.male with history of A. fib, on Xarelto, s/p ablation x 2, restrictive lung disease, OSA, HTN,  Large PEs 11/2019 prompting an NSTEMI, started on heparin and subsequently developed a chest hematoma, CLL with baseline white count of 20-30, history of alcohol abuse. He was diagnosed with likely early stages of Alzheimer's versus possible dementia per [facility] and also has  a history of Wernicke's encephalopathy diagnosed early 2020. He presented on 10/5 for evaluation of suicidal intent, homicidal ideation, and drug overdose. Pt slit both wrists (on Xarelto) and  reportedly overdosed on his Oxycodone and Restoril. He fell and hit the back of his head on his daughters porch. CT Head shows  small occipital bleed with subarachnoid extension. CXR 10/15 negative for acute changes. EGD showed esophageal plaques, suspicious for candidiasis, biopsied.  Mild duodenal bulb erythema and mildly congested mucosa in  the second and of the duodenum.  No clear cause of nausea and vomiting.  No data recorded Assessment / Plan / Recommendation CHL IP CLINICAL IMPRESSIONS 03/08/2020 Clinical Impression Pt was seen in radiology suite for modified barium swallow study. Trials of puree solids, regular texture solids, mixed consistency boluses, a 6mm barium tablet, and thin liquids via straw were administered. [name] oropharyngeal swallow mechanism was within functional limits. Similar behaviors, such as an exaggerated posterior head tilt, were noted during the study without evidence of physiological necessity. Pt does have cervical osteophytes at C4 and C5 which push the posterior pharyngeal wall anteriorly, but did not significantly impact bolus flow. The possibility of pt's report of foods "not going down" being due to the sensation of boluses passing the cervical osteophytes is considered. [name] diet may be advanced to regular texture solids per pt's preference and GI's recommendations. Pt was educated regarding results and recommendations and verbalized understanding. Further skilled SLP services are not clinically indicated at this time.  SLP Visit Diagnosis Dysphagia, unspecified (R13.10) Attention and concentration deficit following -- Frontal lobe and executive function deficit following -- Impact on safety and function No limitations   CHL IP TREATMENT RECOMMENDATION 03/08/2020 Treatment Recommendations No treatment recommended at this time   Prognosis 03/08/2020 Prognosis for Safe Diet Advancement Good Barriers to Reach Goals Cognitive deficits Barriers/Prognosis Comment -- CHL IP DIET RECOMMENDATION 03/08/2020 SLP Diet Recommendations Regular solids;Thin liquid Liquid Administration via Cup;Straw Medication Administration Whole meds  with liquid Compensations -- Postural Changes Seated upright at 90 degrees   CHL IP OTHER RECOMMENDATIONS 03/08/2020 Recommended Consults -- Oral Care Recommendations Oral care BID Other  Recommendations --   CHL IP FOLLOW UP RECOMMENDATIONS 03/08/2020 Follow up Recommendations (No Data)   No flowsheet data found.     CHL IP ORAL PHASE 03/08/2020 Oral Phase WFL Oral - Pudding Teaspoon -- Oral - Pudding Cup -- Oral - Honey Teaspoon -- Oral - Honey Cup -- Oral - Nectar Teaspoon -- Oral - Nectar Cup -- Oral - Nectar Straw -- Oral - Thin Teaspoon -- Oral - Thin Cup -- Oral - Thin Straw -- Oral - Puree -- Oral - Mech Soft -- Oral - Regular -- Oral - Multi-Consistency -- Oral - Pill -- Oral Phase - Comment --  CHL IP PHARYNGEAL PHASE 03/08/2020 Pharyngeal Phase WFL Pharyngeal- Pudding Teaspoon -- Pharyngeal -- Pharyngeal- Pudding Cup -- Pharyngeal -- Pharyngeal- Honey Teaspoon -- Pharyngeal -- Pharyngeal- Honey Cup -- Pharyngeal -- Pharyngeal- Nectar Teaspoon -- Pharyngeal -- Pharyngeal- Nectar Cup -- Pharyngeal -- Pharyngeal- Nectar Straw -- Pharyngeal -- Pharyngeal- Thin Teaspoon -- Pharyngeal -- Pharyngeal- Thin Cup -- Pharyngeal -- Pharyngeal- Thin Straw -- Pharyngeal -- Pharyngeal- Puree -- Pharyngeal -- Pharyngeal- Mechanical Soft -- Pharyngeal -- Pharyngeal- Regular -- Pharyngeal -- Pharyngeal- Multi-consistency -- Pharyngeal -- Pharyngeal- Pill -- Pharyngeal -- Pharyngeal Comment --  CHL IP CERVICAL ESOPHAGEAL PHASE 03/08/2020 Cervical Esophageal Phase WFL Pudding Teaspoon -- Pudding Cup -- Honey Teaspoon -- Honey Cup -- Nectar Teaspoon -- Nectar Cup -- Nectar Straw -- Thin Teaspoon -- Thin Cup -- Thin Straw -- Puree -- Mechanical Soft -- Regular -- Multi-consistency -- Pill -- Cervical Esophageal Comment -- Shanika I. [name], [place], [place] Office number [phone] Pager [phone]; Horton Marshall 03/08/2020, 2:44 PM               Scheduled Meds: . escitalopram  5 mg Oral Daily  . feeding supplement  237 mL Oral BID BM  . metoprolol tartrate  12.5 mg Oral BID  . mometasone-formoterol  2 puff Inhalation BID  . multivitamin with minerals  1 tablet Oral  Daily  . ondansetron  4 mg Oral TID WC & HS  . pantoprazole  40 mg Oral BID  . ramelteon  8 mg Oral QHS  . rivaroxaban  20 mg Oral QHS  . rosuvastatin  40 mg Oral Daily  . thiamine  100 mg Oral Daily  . 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 If 7PM-7AM, please contact night-coverage at [email or web address] 03/09/2020, 12:44 PM  LOS: 17 days          .

## 2020-03-09 NOTE — Consult Note (Signed)
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 Dwyane Dee.  Patient is observed to be lying in the bed, per nursing staff he did not sleep well and had to receive a muscle relaxant in order to go to sleep.  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  He denies any concerns about his current medication, and denies any side effects.  He also denies any suicidal thoughts at this time.  Despite current suicidal ideation, patient continues to meet inpatient criteria due to his most recent suicide attempt that resulted in his admission.  Patient remains high risk for suicide completion for multiple reasons.  02/28/20: David Oneill is a 64 y.o. male patient admitted with intentional overdose. Patient states that he has been feeling "extremely nauseous" today and reports that he "just wants this nausea to go away". He states that he is in the hospital related to 2 Zanaflex that he took to help him sleep and that he woke up in his daughter's front yard and "does not know what all happened that night", but denies that he was attempting to end his life. Minimizes his suicide attempt of an overdose with opiates and Restoril requiring Narcan twice to revive.  Also, cut his wrists requiring sutures.  Inconsistent historian  based on previous notes.  He reports that he currently lives with his daughter but has been told that he is not able to return to stay with her after this hospital discharge. Patient states that this may be due to an incident that occurred shortly after he was vaccinated with his first dose of the COVID-19 vaccine and the next day he had "blood clots and a heart attack" and is now unable to drive. He reports that his daughter wanted him to live with her and her family and that he "gave her all of my money so she could build a house". He reports that she became angry with him after he bought CBD oil at the farmer's market to help with his nausea. Patient has PMH significant for depression. Continues to meet inpatient psychiatric hospitalization as he remains impulsive and wants to "know what's wrong with my head."    HPI per MD: 64 year old male with suicidal/homicidal intent, presented after drug overdose with oxycodone and Restoril and was noted to have both a wrist lacerations.  CT head was noted with small occipital intraparenchymal hemorrhage with subarachnoid extension.  Past Psychiatric History: depression  Risk to Self:  yes Risk to Others:  none Prior Inpatient Therapy:  denies Prior Outpatient Therapy:  none  Past Medical History:  Past Medical History:  Diagnosis Date   Chronic back pain    Sleep apnea    Suicide attempt ([place]) 02/22/2020    Past Surgical History:  Procedure Laterality Date   ABLATION OF DYSRHYTHMIC FOCUS     BIOPSY  03/06/2020   Procedure: BIOPSY;  Surgeon: Fuller Plan,  Pricilla Riffle, MD;  Location: [facility] ENDOSCOPY;  Service: Gastroenterology;;   ESOPHAGOGASTRODUODENOSCOPY N/A 03/06/2020   Procedure: ESOPHAGOGASTRODUODENOSCOPY (EGD);  Surgeon: Ladene Artist, MD;  Location: [facility];  Service: Gastroenterology;  Laterality: N/A;   Family History: History reviewed. No pertinent family history. Family Psychiatric  History: none Social History:  Social History    Substance and Sexual Activity  Alcohol Use Not Currently     Social History   Substance and Sexual Activity  Drug Use Yes   Types: Marijuana    Social History   Socioeconomic History   Marital status: Single    Spouse name: Not on file   Number of children: Not on file   Years of education: Not on file   Highest education level: Not on file  Occupational History   Not on file  Tobacco Use   Smoking status: Never Smoker   Smokeless tobacco: Never Used  Vaping Use   Vaping Use: Never used  Substance and Sexual Activity   Alcohol use: Not Currently   Drug use: Yes    Types: Marijuana   Sexual activity: Not on file  Other Topics Concern   Not on file  Social History Narrative   Not on file   Social Determinants of Health   Financial Resource Strain:    Difficulty of Paying Living Expenses: Not on file  Food Insecurity:    Worried About [place] in the Last Year: Not on file   Ran Out of Food in the Last Year: Not on file  Transportation Needs:    Lack of Transportation (Medical): Not on file   Lack of Transportation (Non-Medical): Not on file  Physical Activity:    Days of Exercise per Week: Not on file   Minutes of Exercise per Session: Not on file  Stress:    Feeling of Stress : Not on file  Social Connections:    Frequency of Communication with Friends and Family: Not on file   Frequency of Social Gatherings with Friends and Family: Not on file   Attends Religious Services: Not on file   Active Member of Clubs or Organizations: Not on file   Attends Archivist Meetings: Not on file   Marital Status: Not on file   Additional Social History:    Allergies:  Not on File  Labs:  Results for orders placed or performed during the hospital encounter of 02/21/20 (from the past 48 hour(s))  Vitamin B12     Status: Abnormal   Collection Time: 03/08/20  2:50 AM  Result Value Ref Range   Vitamin B-12 1,766 (H)  180 - 914 pg/mL    Comment: (NOTE) This assay is not validated for testing neonatal or myeloproliferative syndrome specimens for Vitamin B12 levels. Performed at [facility], [place] [address], [place], [place] [address]   Folate     Status: None   Collection Time: 03/08/20  2:50 AM  Result Value Ref Range   Folate 28.4 >5.9 ng/mL    Comment: Performed at [facility] [address] [place], [place] [address]  Iron and TIBC     Status: Abnormal   Collection Time: 03/08/20  2:50 AM  Result Value Ref Range   Iron 38 (L) 45 - 182 ug/dL   TIBC 342 250 - 450 ug/dL   Saturation Ratios 11 (L) 17.9 - 39.5 %   UIBC 304 ug/dL    Comment: Performed at [facility], [address] [place], [place] [address]  Ferritin     Status: None   Collection Time: 03/08/20  2:50 AM  Result Value Ref Range   Ferritin 217 24 - 336 ng/mL    Comment: Performed at [facility], [place] [address], [place], [place] [address]  Reticulocytes     Status: None   Collection Time: 03/08/20  2:50 AM  Result Value Ref Range   Retic Ct Pct 1.7 0.4 - 3.1 %   RBC. 4.31 4.22 - 5.81 MIL/uL   Retic Count, Absolute 71.5 19.0 - 186.0 K/uL   Immature Retic Fract 5.4 2.3 - 15.9 %    Comment: Performed at [facility] [address], [place], [place] [address]  TSH     Status: None   Collection Time: 03/08/20  2:50 AM  Result Value Ref Range   TSH 1.040 0.350 - 4.500 uIU/mL    Comment: Performed by a 3rd Generation assay with a functional sensitivity of <=0.01 uIU/mL. Performed at [facility], [place] [address], [place], [place] [address]   RPR     Status: None   Collection Time: 03/08/20  2:50 AM  Result Value Ref Range   RPR Ser Ql NON REACTIVE NON REACTIVE    Comment: Performed at [facility], [place] [address], [place], [place] [address]  Rapid HIV screen (HIV 1/2 Ab+Ag) ([facility] Only)     Status: None   Collection Time: 03/08/20  2:50 AM  Result Value Ref Range   HIV-1 P24  Antigen - HIV24 NON REACTIVE NON REACTIVE    Comment: (NOTE) Detection of p24 may be inhibited by biotin in the sample, causing false negative results in acute infection.    HIV 1/2 Antibodies NON REACTIVE NON REACTIVE   Interpretation (HIV Ag Ab)      A non reactive test result means that HIV 1 or HIV 2 antibodies and HIV 1 p24 antigen were not detected in the specimen.    Comment: Performed at [facility], [place] [address], [place], [place] [address]    Current Facility-Administered Medications  Medication Dose Route Frequency Provider Last Rate Last Admin   acetaminophen (TYLENOL) tablet 650 mg  650 mg Oral Q6H PRN Jose Persia, MD   650 mg at 03/09/20 0108   albuterol (PROVENTIL) (2.5 MG/3ML) 0.083% nebulizer solution 2.5 mg  2.5 mg Nebulization Q2H PRN Magdalen Spatz, NP       docusate sodium (COLACE) capsule 100 mg  100 mg Oral BID PRN Magdalen Spatz, NP   100 mg at 03/04/20 0913   escitalopram (LEXAPRO) tablet 5 mg  5 mg Oral Daily Eulogio Bear U, DO   5 mg at 03/09/20 0955   feeding supplement (ENSURE ENLIVE / ENSURE PLUS) liquid 237 mL  237 mL Oral BID BM Samella Parr, NP   237 mL at 03/09/20 0957   fluticasone (FLONASE) 50 MCG/ACT nasal spray 2 spray  2 spray Each Nare Daily PRN Eulogio Bear U, DO   2 spray at 03/08/20 0805   metoprolol tartrate (LOPRESSOR) tablet 12.5 mg  12.5 mg Oral BID Vann, Jessica U, DO   12.5 mg at 03/09/20 0955   mometasone-formoterol (DULERA) 100-5 MCG/ACT inhaler 2 puff  2 puff Inhalation BID Jose Persia, MD   2 puff at 03/08/20 2023   multivitamin with minerals tablet 1 tablet  1 tablet Oral Daily Jacky Kindle, MD   1 tablet at 03/09/20 [phone]  naloxone [facility]) injection 0.4 mg  0.4 mg Intravenous PRN Simonne Maffucci B, MD   0.4 mg at 02/21/20 2202   ondansetron (ZOFRAN) injection 4 mg  4 mg Intravenous Q6H PRN Magdalen Spatz, NP   4 mg at 03/09/20 0025   ondansetron (ZOFRAN-ODT) disintegrating tablet 4 mg  4 mg Oral TID WC  & HS Ladene Artist, MD   4 mg at 03/09/20 0753   pantoprazole (PROTONIX) EC tablet 40 mg  40 mg Oral BID Eulogio Bear U, DO   40 mg at 03/09/20 0955   polyethylene glycol (MIRALAX / GLYCOLAX) packet 17 g  17 g Oral Daily PRN Magdalen Spatz, NP   17 g at 03/03/20 0818   ramelteon (ROZEREM) tablet 8 mg  8 mg Oral QHS Patrecia Pour, NP   8 mg at 03/08/20 2345   rivaroxaban (XARELTO) tablet 20 mg  20 mg Oral QHS Nelida Meuse III, MD   20 mg at 03/08/20 2331   rosuvastatin (CRESTOR) tablet 40 mg  40 mg Oral Daily Samella Parr, NP   40 mg at 03/09/20 1003   thiamine tablet 100 mg  100 mg Oral Daily Eulogio Bear U, DO   100 mg at 03/09/20 0071   tiZANidine (ZANAFLEX) tablet 4 mg  4 mg Oral Q8H PRN Samella Parr, NP   4 mg at 03/09/20 2197   vitamin B-12 (CYANOCOBALAMIN) tablet 1,000 mcg  1,000 mcg Oral Daily Eulogio Bear U, DO   1,000 mcg at 03/09/20 5883    Musculoskeletal: Strength & Muscle Tone: increased Gait & Station: normal Patient leans: N/A  Psychiatric Specialty Exam: Physical Exam Vitals and nursing note reviewed.  Constitutional:      Appearance: Normal appearance.  HENT:     Head: Normocephalic.     Nose: Nose normal.  Pulmonary:     Effort: Pulmonary effort is normal.  Musculoskeletal:        General: Normal range of motion.     Cervical back: Normal range of motion.  Neurological:     General: No focal deficit present.     Mental Status: He is alert and oriented to person, place, and time.  Psychiatric:        Attention and Perception: Perception normal.        Mood and Affect: Mood is anxious and depressed.        Speech: Speech normal.        Behavior: Behavior is agitated.        Thought Content: Thought content includes suicidal ideation. Thought content includes suicidal plan.        Cognition and Memory: Cognition and memory normal.        Judgment: Judgment is impulsive.     Review of Systems  Gastrointestinal: Positive for nausea.   Psychiatric/Behavioral: Positive for agitation, dysphoric mood, self-injury and suicidal ideas. The patient is nervous/anxious.   All other systems reviewed and are negative.   Blood pressure 136/72, pulse 82, temperature 98.1 F (36.7 C), temperature source Oral, resp. rate 18, height 6' (1.829 m), weight 102 kg, SpO2 100 %.Body mass index is 30.5 kg/m.  General Appearance: Casual and Fairly Groomed  Eye Contact:  Fair  Speech:  Clear and Coherent and Normal Rate  Volume:  Normal  Mood:  Depressed  Affect:  Appropriate and Congruent  Thought Process:  Coherent, Linear and Descriptions of Associations: Intact  Orientation:  Full (Time, Place, and Person)  Thought Content:  Logical  Suicidal Thoughts:  Denies at this time, however recent suicide attempt led to his current admission.  Homicidal Thoughts:  No  Memory:  Immediate;   Fair Recent;   Fair Remote;   Fair  Judgement:  Intact  Insight:  Present  Psychomotor Activity:  Normal  Concentration:  Concentration: Fair and Attention Span: Fair  Recall:  [employer] of Knowledge:  Fair  Language:  Good  Akathisia:  No  Handed:  Right  AIMS (if indicated):     Assets:  Leisure Time Resilience Social Support  ADL's:  Intact  Cognition:  WNL  Sleep:        Treatment Plan Summary: Major depressive disorder, recurrent, severe without psychosis: -Will increase Lexapro 10 mg p.o. daily.  -Continue to work with social work for placement into geriatric psych facility. -Will reassess in 1 week if patient has not been admitted to another facility.  Insomnia -Continue current medication -Continue Rozerem 8 mg at bedtime  Disposition: Recommend psychiatric Inpatient admission when medically cleared.  Suella Broad, FNP 03/09/2020 2:15 PM

## 2020-03-09 NOTE — Progress Notes (Signed)
MD, pt has chronic back pain issues, pt does take muscle relaxers to help with pain at home, takes Tizanidine 4 mg, please address, thanks Arvella Nigh RN .

## 2020-03-10 DIAGNOSIS — S61511D Laceration without foreign body of right wrist, subsequent encounter: Secondary | ICD-10-CM | POA: Diagnosis not present

## 2020-03-10 DIAGNOSIS — I609 Nontraumatic subarachnoid hemorrhage, unspecified: Secondary | ICD-10-CM | POA: Diagnosis not present

## 2020-03-10 DIAGNOSIS — T1491XA Suicide attempt, initial encounter: Secondary | ICD-10-CM | POA: Diagnosis not present

## 2020-03-10 NOTE — Progress Notes (Signed)
PROGRESS NOTE    David Oneill  MVH:[id number] DOB: 1955/08/22 DOA: 02/21/2020 PCP: Patient, No Pcp Per   Brief Narrative:  64 year old male admitted with suicidal intent and presented with drug overdose with oxycodone and Restoril along with wrist lacerations. Patient initially admitted by PCCM. CT head was noted to have a small occipital intraparenchymal hemorrhage and subarachnoid extension. Psychiatry consulted and recommended inpatient psych. Patient now awaiting [facility] psych placement.   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  Reported history of Wernicke's encephalopathy/history of memory loss/chronic insomnia -Documented on outside records; he confirmed that several years ago he had "a bad time of it" at the time this diagnosis was given. -Check anemia panel including B12 and folate -We will ask SLP and OT to assist with cognitive evaluation -Has also been followed by [name] psych at [facility] at the [facility]: Differential during evaluation September 2021  included timers dementia, frontotemporal alcoholic dementia, pseudodementia with depression or possible vascular etiology -September was started on mirtazapine 15 mg at night by [name] psych-given symptoms complicated by ongoing depression and insomnia -MRI of the brain was ordered at that September visit he was also referred to Dr. Paula Libra with geriatric psychiatry-it does not appear that eMRI was completed nor has patient followed up with outpatient Geri psych [place] review of outside records -TSH, RPR, and HIV negative. Iron low and will be replaced.  Suicide attempt - bilateral wrist laceration and intentional drug overdose -psych evaluated and recommended inpatient geri-psych placement -Continue suicide precautions-psychiatry documents high risk for suicide since lived alone prior to admission -SW asked for psych re-consult- Reconsulted on 02/28/2020, and continues to need inpatient psychiatry placement when medically stable -discussed with TOC on 10/14- still searching for geripsych -Above regarding previous outpatient evaluation -Recent chest x-ray which unremarkable for infection. CBC /CMP WNL. EKG showed AF, no acute changes.  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 Recommending continuing Lexapro (had previously been on Cymbalta but had self discontinued this medication prior to September visit with [facility] psychiatrist and was subsequently started on Remeron). -During this hospitalization Remeron and melatonin were discontinued.  -Rozerem was started at bedtime.  Also documented patient has had chronic issues with ongoing insomnia  Dysphagia -Has been evaluated by SLP - MBSS 10/21 with recommendation for regular diet and thin liquids  a no signs of dysphagia  Protein calorie malnutrition Nutrition Status: Nutrition Problem: Inadequate oral intake Etiology: decreased appetite Signs/Symptoms: meal completion < 50% Interventions: Ensure Enlive (each supplement provides 350kcal and 20 grams of  protein), MVI  Iron deficiency anemia -Likely related to poor nutrition in context of recurrent nausea since June -Iron 38 and was given 1000 mg of IV iron on 95/63  Chronic diastolic heart failure/pulmonary hypertension/TR -Currently appears to be euvolemic and compensated -Continue to monitor I's and O's, daily weights -Has been followed by outpatient cardiology for this as well as atrial fibrillation -Echocardiogram 12/31/19 from [facility] instead preserved EF 55 to 60% with mild aortic regurgitation and mild to moderate TR with RVSP 42 mmHg and mild pulmonary hypertension, aortic sinus moderately dilated at 4.9 cm and a moderately dilated ascending aorta 5.0 cm  Chronic atrial fibrillation/flutter -Continue Xarelto -Currently rate controlled on metoprolol -Followed by cardiology and EP at [facility] clinics  Recent Massive PE -Hospitalized in July of this year at [facility] non-STEMI characterized as demand ischemia at same time -Continue Xarelto  CLL -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 Joan Mayans at [facility]; patient remains in observational status without any active treatments  Obesity/OSA -BMI 31.33  -Patient will need to follow-up with his PCP on discharge and discuss lifestyle modifications -It appears patient underwent sleep study in the past few months but apparently [employer] denied CPAP  Hypokalemia -Resolved -continue to monitor BMP intermittently   Nausea/possible esophageal candidiasis -In review of outside records patient has been having these symptoms since June 2021 -Tolerating get her diet without  nausea -Gastroenterology consulted -s/p EGD which showed esophageal plaques, suspicious for candidiasis pathology negative.Mild duodenal bulb erythema and mildly congested mucosa in the second end of the duodenum pathology demonstrating acute inflammation.  -Continue PPI. Discontinued famotidine and Reglan.  -Continue Zofran (on scheduled as well as prn for breakthrough). -Of note patient has been chronically using hemp flower and CBD oil as well as CBD vaping to assist with nausea symptoms-states has not used marijuana after returning to [place] from [place]  Chronic neck pain -Followed by Dr. Celedonio Miyamoto at the clinics at [place] [facility] with mild to moderate degenerative disc disease with mild to severe facet joint arthrosis contributing to multilevel mild to severe neural foraminal stenosis and mild to moderate acquired spinal canal stenosis -Recommendation for PT  DVT prophylaxis:  Xarelto Code Status: Full Family Communication:  NO one at bed side.  Disposition Plan: The patient is from: Home  Anticipated d/c is to: Inpatient Geri Psych unit  Anticipated d/c date is: > 3 days  Patient currently is medically stable to d/c.  [place].  Needs Geripsych placement 2/2 presenting with suicide attempt  Consultants:    Psychiatry  Procedures:  Antimicrobials:  Anti-infectives (From admission, onward)   None      Subjective: Patient was seen and examined at bedside.  Overnight events noted.  He reports having muscle spasm in the back.  He reports icing and alternating heating is helping the spasms.  Objective: Vitals:   03/10/20 0457 03/10/20 0837 03/10/20 0908 03/10/20 1143  BP: 129/76  125/73 121/82  Pulse: 64 73 78 70  Resp: 16 18  18   Temp: 99.2 F (37.3 C)   98.1 F (36.7 C)  TempSrc: Oral   Oral  SpO2: 100% 98% 100% 98%  Weight: 103.4 kg     Height:        Intake/Output  Summary (Last 24 hours) at 03/10/2020 1340 Last data filed at 03/10/2020 0759 Gross per 24 hour  Intake 1340 ml  Output 1500 ml  Net -160 ml   Filed Weights   03/08/20 0706 03/09/20 0500 03/10/20 0457  Weight: 101.6 kg 102 kg 103.4 kg    Examination:  General exam: Appears calm and comfortable  Respiratory system: Clear to auscultation. Respiratory effort normal. Cardiovascular system: S1 & S2 heard, RRR. No JVD, murmurs, rubs, gallops or clicks. No pedal edema. Gastrointestinal system: Abdomen is nondistended, soft and nontender. No organomegaly or masses felt. Normal bowel sounds heard. Central nervous system: Alert and oriented. No focal neurological deficits. Extremities: Symmetric 5 x 5 power. Skin: No rashes, lesions or ulcers Psychiatry: Judgement and insight appear normal. Mood & affect appropriate.     Data Reviewed: I have personally reviewed following labs and imaging studies  CBC: Recent Labs  Lab 03/04/20 0816 03/07/20 0428  WBC  --  18.6*  HGB 12.4* 12.1*  HCT 39.4 36.8*  MCV  --  87.2  PLT  --  182   Basic Metabolic Panel: Recent Labs  Lab 03/04/20 0816 03/07/20 0428  NA 137 136  K 3.9 3.9  CL 101 102  CO2 25 25  GLUCOSE 117* 107*  BUN 17 14  CREATININE 1.24 1.04  CALCIUM 9.3 9.3  MG 2.2  --    GFR: Estimated Creatinine Clearance: 88 mL/min (by C-G formula based on SCr of 1.04 mg/dL). Liver Function Tests: Recent Labs  Lab 03/07/20 0428  AST 18  ALT 14  ALKPHOS 56  BILITOT 1.0  PROT 6.3*  ALBUMIN 3.6   No results for input(s): LIPASE, AMYLASE in the last 168 hours. No results for input(s): AMMONIA in the last 168 hours. Coagulation Profile: No results for input(s): INR, PROTIME in the last 168 hours. Cardiac Enzymes: No results for input(s): CKTOTAL, CKMB, CKMBINDEX, TROPONINI in the last 168 hours. BNP (last 3 results) No results for input(s): PROBNP in the last 8760 hours. HbA1C: No results for input(s): HGBA1C in the last 72  hours. CBG: Recent Labs  Lab 03/06/20 0649  GLUCAP 102*   Lipid Profile: No results for input(s): CHOL, HDL, LDLCALC, TRIG, CHOLHDL, LDLDIRECT in the last 72 hours. Thyroid Function Tests: Recent Labs    03/08/20 0250  TSH 1.040   Anemia Panel: Recent Labs    03/08/20 0250  VITAMINB12 1,766*  FOLATE 28.4  FERRITIN 217  TIBC 342  IRON 38*  RETICCTPCT 1.7   Sepsis Labs: No results for input(s): PROCALCITON, LATICACIDVEN in the last 168 hours.  No results found for this or any previous visit (from the past 240 hour(s)).   Radiology Studies: No results found.  Scheduled Meds: . escitalopram  10 mg Oral Daily  . feeding supplement  237 mL Oral BID BM  . metoprolol tartrate  12.5 mg Oral BID  . mometasone-formoterol  2 puff Inhalation BID  . multivitamin with minerals  1 tablet Oral Daily  . ondansetron  4 mg Oral TID WC & HS  . pantoprazole  40 mg Oral BID  . ramelteon  8 mg Oral QHS  . rivaroxaban  20 mg Oral QHS  . rosuvastatin  40 mg Oral Daily  . thiamine  100 mg Oral Daily  . cyanocobalamin  1,000 mcg Oral Daily   Continuous Infusions:   LOS: 18 days    Time spent: 25 mins.    Shawna Clamp, MD [facility]   If 7PM-7AM, please contact night-coverage

## 2020-03-10 NOTE — Progress Notes (Signed)
Pt C/O Posterior neck pain that radiates to his lower back, Tizanidine administered with Tylenol with minimal effect but later had MD on call order K-Pad. He is on Zofran SL for nausea scheduled, so he did not C/O nausea and vomiting on my shift. He denies any suicidal ideation. Sitter at bedside.

## 2020-03-11 DIAGNOSIS — S61511D Laceration without foreign body of right wrist, subsequent encounter: Secondary | ICD-10-CM | POA: Diagnosis not present

## 2020-03-11 NOTE — Consult Note (Signed)
WOC Nurse Consult Note: Reason for Consult:Bilater wrist lacerations with sutures Wound type:Traumaatic Pressure Injury POA: N/A Measurement:Right = 5cm, Left = 4cm Wound bed:N/A Drainage (amount, consistency, odor) none Periwound:mild ereytheam Dressing procedure/placement/frequency: Conservative care orders are provided for bilateral arm incisions with sutures. These sutures have been in place for three weeks, since presentation to ED on 02/21/20. Physician/provider in house or PCP post discharge to provide guidance for suture removal.  WOC nursing team will not follow, but will remain available to this patient, the nursing and medical teams.  Please re-consult if needed. Thanks, Maudie Flakes, MSN, RN, [place], Arther Abbott  Pager# [phone]

## 2020-03-11 NOTE — Progress Notes (Signed)
PROGRESS NOTE    David Oneill  WLS:[id number] DOB: Sep 30, 1955 DOA: 02/21/2020 PCP: Patient, No Pcp Per   Brief Narrative:  64 year old male admitted with suicidal intent and presented with drug overdose with oxycodone and Restoril along with wrist lacerations. Patient initially admitted by PCCM. CT head was noted to have a small occipital intraparenchymal hemorrhage and subarachnoid extension. Psychiatry consulted and recommended inpatient psych. Patient now awaiting [facility] psych placement.   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  Reported history of Wernicke's encephalopathy/history of memory loss/chronic insomnia -Documented on outside records; he confirmed that several years ago he had "a bad time of it" at the time this diagnosis was given. -Check anemia panel including B12 and folate -We will ask SLP and OT to assist with cognitive evaluation -Has also been followed by [name] psych at [facility] at the [facility]: Differential during evaluation September 2021  included timers dementia, frontotemporal alcoholic dementia, pseudodementia with depression or possible vascular etiology -September was started on mirtazapine 15 mg at night by [name] psych-given symptoms complicated by ongoing depression and insomnia -MRI of the brain was ordered at that September visit he was also referred to Dr. Paula Libra with geriatric psychiatry-it does not appear that eMRI was completed nor has patient followed up with outpatient Geri psych [place] review of outside records -TSH, RPR, and HIV negative. Iron low and will be replaced.  Suicide attempt - bilateral wrist laceration and intentional drug overdose -psych evaluated and recommended inpatient geri-psych placement -Continue suicide precautions-psychiatry documents high risk for suicide since lived alone prior to admission -SW asked for psych re-consult- Reconsulted on 02/28/2020, and continues to need inpatient psychiatry placement when medically stable -discussed with TOC on 10/14- still searching for geripsych -Above regarding previous outpatient evaluation -Recent chest x-ray which unremarkable for infection. CBC /CMP WNL. EKG showed AF, no acute changes.  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 Recommending continuing Lexapro (had previously been on Cymbalta but had self discontinued this medication prior to September visit with [facility] psychiatrist and was subsequently started on Remeron). -During this hospitalization Remeron and melatonin were discontinued.  -Rozerem was started at bedtime.  Also documented patient has had chronic issues with ongoing insomnia  Dysphagia -Has been evaluated by SLP - MBSS 10/21 with recommendation for regular diet and thin liquids a  no signs of dysphagia  Protein calorie malnutrition Nutrition Status: Nutrition Problem: Inadequate oral intake Etiology: decreased appetite Signs/Symptoms: meal completion < 50% Interventions: Ensure Enlive (each supplement provides 350kcal and 20 grams of  protein), MVI  Iron deficiency anemia -Likely related to poor nutrition in context of recurrent nausea since June. -Iron 38 and was given 1000 mg of IV iron on 82/95  Chronic diastolic heart failure/pulmonary hypertension/TR -Currently appears to be euvolemic and compensated -Continue to monitor I's and O's, daily weights -Has been followed by outpatient cardiology for this as well as atrial fibrillation -Echocardiogram 12/31/19 from [facility] instead preserved EF 55 to 60% with mild aortic regurgitation and mild to moderate TR with RVSP 42 mmHg and mild pulmonary hypertension, aortic sinus moderately dilated at 4.9 cm and a moderately dilated ascending aorta 5.0 cm  Chronic atrial fibrillation/flutter -Continue Xarelto -Currently rate controlled on metoprolol -Followed by cardiology and EP at [facility] clinics  Recent Massive PE -Hospitalized in July of this year at [facility] non-STEMI characterized as demand ischemia at same time -Continue Xarelto  CLL -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 Joan Mayans at [facility]; patient remains in observational status without any active treatments  Obesity/OSA -BMI 31.33  -Patient will need to follow-up with his PCP on discharge and discuss lifestyle modifications -It appears patient underwent sleep study in the past few months but apparently [employer] denied CPAP  Hypokalemia -Resolved -continue to monitor BMP intermittently   Nausea/possible esophageal candidiasis -In review of outside records patient has been having these symptoms since June 2021 -Tolerating get her diet without  nausea -Gastroenterology consulted -s/p EGD which showed esophageal plaques, suspicious for candidiasis pathology negative.Mild duodenal bulb erythema and mildly congested mucosa in the second end of the duodenum pathology demonstrating acute inflammation.  -Continue PPI. Discontinued famotidine and Reglan.  -Continue Zofran (on scheduled as well as prn for breakthrough). -Of note patient has been chronically using hemp flower and CBD oil as well as CBD vaping to assist with nausea symptoms-states has not used marijuana after returning to [place] from [place]  Chronic neck pain -Followed by Dr. Celedonio Miyamoto at the clinics at [place] [facility] with mild to moderate degenerative disc disease with mild to severe facet joint arthrosis contributing to multilevel mild to severe neural foraminal stenosis and mild to moderate acquired spinal canal stenosis -Recommendation for PT  DVT prophylaxis:  Xarelto Code Status: Full Family Communication:  NO one at bed side.  Disposition Plan: The patient is from: Home  Anticipated d/c is to: Inpatient Geri Psych unit  Anticipated d/c date is: > 3 days  Patient currently is medically stable to d/c.  [place].  Needs Geripsych placement 2/2 presenting with suicide attempt  Consultants:    Psychiatry  Procedures:  Antimicrobials:  Anti-infectives (From admission, onward)   None      Subjective: Patient was seen and examined at bedside.  Overnight events noted.  He reports couldn't slept well last night due to muscle spasm, denies any other concerns. Objective: Vitals:   03/11/20 0653 03/11/20 0752 03/11/20 0922 03/11/20 0923  BP: (!) 146/75  137/76 137/76  Pulse: 64 72 80 89  Resp: 18 16  16   Temp: 98.1 F (36.7 C)   98.5 F (36.9 C)  TempSrc: Oral   Oral  SpO2: 99% 98%  99%  Weight: 102.7 kg     Height:        Intake/Output Summary (Last 24 hours) at  03/11/2020 1141 Last data filed at 03/11/2020 1030 Gross per 24 hour  Intake 1840 ml  Output 1400 ml  Net 440 ml  Filed Weights   03/09/20 0500 03/10/20 0457 03/11/20 0653  Weight: 102 kg 103.4 kg 102.7 kg    Examination:  General exam: Appears calm and comfortable  Respiratory system: Clear to auscultation. Respiratory effort normal. Cardiovascular system: S1 & S2 heard, RRR. No JVD, murmurs, rubs, gallops or clicks. No pedal edema. Gastrointestinal system: Abdomen is nondistended, soft and nontender. No organomegaly or masses felt. Normal bowel sounds heard. Central nervous system: Alert and oriented. No focal neurological deficits. Extremities: both wrists with stitches, redness has improved. Skin: No rashes, lesions or ulcers Psychiatry: Judgement and insight appear normal. Mood & affect appropriate.     Data Reviewed: I have personally reviewed following labs and imaging studies  CBC: Recent Labs  Lab 03/07/20 0428  WBC 18.6*  HGB 12.1*  HCT 36.8*  MCV 87.2  PLT 209   Basic Metabolic Panel: Recent Labs  Lab 03/07/20 0428  NA 136  K 3.9  CL 102  CO2 25  GLUCOSE 107*  BUN 14  CREATININE 1.04  CALCIUM 9.3   GFR: Estimated Creatinine Clearance: 87.7 mL/min (by C-G formula based on SCr of 1.04 mg/dL). Liver Function Tests: Recent Labs  Lab 03/07/20 0428  AST 18  ALT 14  ALKPHOS 56  BILITOT 1.0  PROT 6.3*  ALBUMIN 3.6   No results for input(s): LIPASE, AMYLASE in the last 168 hours. No results for input(s): AMMONIA in the last 168 hours. Coagulation Profile: No results for input(s): INR, PROTIME in the last 168 hours. Cardiac Enzymes: No results for input(s): CKTOTAL, CKMB, CKMBINDEX, TROPONINI in the last 168 hours. BNP (last 3 results) No results for input(s): PROBNP in the last 8760 hours. HbA1C: No results for input(s): HGBA1C in the last 72 hours. CBG: Recent Labs  Lab 03/06/20 0649  GLUCAP 102*   Lipid Profile: No results for  input(s): CHOL, HDL, LDLCALC, TRIG, CHOLHDL, LDLDIRECT in the last 72 hours. Thyroid Function Tests: No results for input(s): TSH, T4TOTAL, FREET4, T3FREE, THYROIDAB in the last 72 hours. Anemia Panel: No results for input(s): VITAMINB12, FOLATE, FERRITIN, TIBC, IRON, RETICCTPCT in the last 72 hours. Sepsis Labs: No results for input(s): PROCALCITON, LATICACIDVEN in the last 168 hours.  No results found for this or any previous visit (from the past 240 hour(s)).   Radiology Studies: No results found.  Scheduled Meds:  escitalopram  10 mg Oral Daily   feeding supplement  237 mL Oral BID BM   metoprolol tartrate  12.5 mg Oral BID   mometasone-formoterol  2 puff Inhalation BID   multivitamin with minerals  1 tablet Oral Daily   ondansetron  4 mg Oral TID WC & HS   pantoprazole  40 mg Oral BID   ramelteon  8 mg Oral QHS   rivaroxaban  20 mg Oral QHS   rosuvastatin  40 mg Oral Daily   thiamine  100 mg Oral Daily   cyanocobalamin  1,000 mcg Oral Daily   Continuous Infusions:   LOS: 19 days    Time spent: 25 mins.    Shawna Clamp, MD [facility]   If 7PM-7AM, please contact night-coverage

## 2020-03-12 DIAGNOSIS — R110 Nausea: Secondary | ICD-10-CM | POA: Diagnosis not present

## 2020-03-12 DIAGNOSIS — T50902D Poisoning by unspecified drugs, medicaments and biological substances, intentional self-harm, subsequent encounter: Secondary | ICD-10-CM

## 2020-03-12 DIAGNOSIS — T50902A Poisoning by unspecified drugs, medicaments and biological substances, intentional self-harm, initial encounter: Secondary | ICD-10-CM | POA: Diagnosis not present

## 2020-03-12 DIAGNOSIS — S61512D Laceration without foreign body of left wrist, subsequent encounter: Secondary | ICD-10-CM | POA: Diagnosis not present

## 2020-03-12 DIAGNOSIS — F332 Major depressive disorder, recurrent severe without psychotic features: Secondary | ICD-10-CM | POA: Diagnosis not present

## 2020-03-12 NOTE — Consult Note (Signed)
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  He is sitting in his reclining chair watching television.  "I'm hanging in there."  Sleep is fair, appetite is poor related to chronic nausea.  He has high depression and anxiety.  "I am thinking about everything, overwhelming.  I can't do this by myself.   Patient continues to meet inpatient criteria due to his most recent suicide attempt that resulted in his admission.  Patient remains high risk for suicide completion for multiple reasons.  02/28/20: David Oneill is a 64 y.o. male patient admitted with intentional overdose. Patient states that he has been feeling "extremely nauseous" today and reports that he "just wants this nausea to go away". He states that he is in the hospital related to 2 Zanaflex that he took to help him sleep and that he woke up in his daughter's front yard and "does not know what all happened that night", but denies that he was attempting to end his life. Minimizes his suicide attempt of an overdose with opiates and Restoril requiring Narcan twice to revive.  Also, cut his wrists requiring sutures.  Inconsistent historian based on previous notes.  He reports that he currently lives with his daughter but has been told that he is not able to  return to stay with her after this hospital discharge. Patient states that this may be due to an incident that occurred shortly after he was vaccinated with his first dose of the COVID-19 vaccine and the next day he had "blood clots and a heart attack" and is now unable to drive. He reports that his daughter wanted him to live with her and her family and that he "gave her all of my money so she could build a house". He reports that she became angry with him after he bought CBD oil at the farmer's market to help with his nausea. Patient has PMH significant for depression. Continues to meet inpatient psychiatric hospitalization as he remains impulsive and wants to "know what's wrong with my head."    HPI per MD: 64 year old male with suicidal/homicidal intent, presented after drug overdose with oxycodone and Restoril and was noted to have both a wrist lacerations.  CT head was noted with small occipital intraparenchymal hemorrhage with subarachnoid extension.  Past Psychiatric History: depression  Risk to Self:  yes Risk to Others:  none Prior Inpatient Therapy:  denies Prior Outpatient Therapy:  none  Past Medical History:  Past Medical History:  Diagnosis Date  . Chronic back pain   . Sleep apnea   . Suicide attempt ([place]) 02/22/2020    Past Surgical History:  Procedure Laterality Date  . ABLATION OF DYSRHYTHMIC FOCUS    . BIOPSY  03/06/2020   Procedure: BIOPSY;  Surgeon: Ladene Artist, MD;  Location: [facility] ENDOSCOPY;  Service: Gastroenterology;;  . ESOPHAGOGASTRODUODENOSCOPY N/A 03/06/2020   Procedure: ESOPHAGOGASTRODUODENOSCOPY (EGD);  Surgeon: Ladene Artist, MD;  Location: [facility];  Service: Gastroenterology;  Laterality: N/A;   Family History: History reviewed. No pertinent family history. Family Psychiatric  History: none Social History:  Social History   Substance and Sexual Activity  Alcohol Use Not Currently     Social History   Substance and Sexual Activity  Drug  Use Yes  . Types: Marijuana    Social History   Socioeconomic History  . Marital status: Single    Spouse name: Not on file  . Number of children: Not on file  . Years of education: Not on file  . Highest education level: Not on file  Occupational History  . Not on file  Tobacco Use  . Smoking status: Never Smoker  . Smokeless tobacco: Never Used  Vaping Use  . Vaping Use: Never used  Substance and Sexual Activity  . Alcohol use: Not Currently  . Drug use: Yes    Types: Marijuana  . Sexual activity: Not on file  Other Topics Concern  . Not on file  Social History Narrative  . Not on file   Social Determinants of Health   Financial Resource Strain:   . Difficulty of Paying Living Expenses: Not on file  Food Insecurity:   . Worried About Charity fundraiser in the Last Year: Not on file  . Ran Out of Food in the Last Year: Not on file  Transportation Needs:   . Lack of Transportation (Medical): Not on file  . Lack of Transportation (Non-Medical): Not on file  Physical Activity:   . Days of Exercise per Week: Not on file  . Minutes of Exercise per Session: Not on file  Stress:   . Feeling of Stress : Not on file  Social Connections:   . Frequency of Communication with Friends and Family: Not on file  . Frequency of Social Gatherings with Friends and Family: Not on file  . Attends Religious Services: Not on file  . Active Member of Clubs or Organizations: Not on file  . Attends Archivist Meetings: Not on file  . Marital Status: Not on file   Additional Social History:    Allergies:  Not on File  Labs:  No results found for this or any previous visit (from the past 48 hour(s)).  Current Facility-Administered Medications  Medication Dose Route Frequency Provider Last Rate Last Admin  . acetaminophen (TYLENOL) tablet 650 mg  650 mg Oral Q6H PRN Jose Persia, MD   650 mg at 03/12/20 0935  . albuterol (PROVENTIL) (2.5 MG/3ML) 0.083% nebulizer  solution 2.5 mg  2.5 mg Nebulization Q2H PRN Magdalen Spatz, NP      . docusate sodium (COLACE) capsule 100 mg  100 mg Oral BID PRN Magdalen Spatz, NP   100 mg at 03/04/20 0913  . escitalopram (LEXAPRO) tablet 10 mg  10 mg Oral Daily Suella Broad, FNP   10 mg at 03/12/20 0858  . feeding supplement (ENSURE ENLIVE / ENSURE PLUS) liquid 237 mL  237 mL Oral BID BM Samella Parr, NP   237 mL at 03/12/20 0937  . fluticasone (FLONASE) 50 MCG/ACT nasal spray 2 spray  2 spray Each Nare Daily PRN Eulogio Bear U, DO   2 spray at 03/09/20 2033  . metoprolol tartrate (LOPRESSOR) tablet 12.5 mg  12.5 mg Oral BID Eulogio Bear U, DO   12.5 mg at 03/11/20 2118  . mometasone-formoterol (DULERA) 100-5 MCG/ACT inhaler 2 puff  2 puff Inhalation BID Jose Persia, MD   2 puff at 03/11/20 2004  . multivitamin with minerals tablet 1 tablet  1 tablet Oral Daily Jacky Kindle, MD   1 tablet at 03/12/20 0857  . naloxone [facility]) injection 0.4 mg  0.4 mg Intravenous PRN Simonne Maffucci B, MD   0.4 mg at 02/21/20 2202  . ondansetron (ZOFRAN) injection 4 mg  4 mg Intravenous Q6H PRN Magdalen Spatz, NP   4 mg at 03/11/20 1536  . ondansetron (ZOFRAN-ODT) disintegrating tablet 4 mg  4 mg Oral TID WC & HS Ladene Artist, MD   4 mg at 03/12/20 0858  . pantoprazole (PROTONIX) EC tablet 40 mg  40 mg Oral BID Eulogio Bear U, DO   40 mg at 03/12/20 0858  . polyethylene glycol (MIRALAX / GLYCOLAX) packet 17 g  17 g Oral Daily PRN Magdalen Spatz, NP   17 g at 03/03/20 0818  . ramelteon (ROZEREM) tablet 8 mg  8 mg Oral QHS Patrecia Pour, NP   8 mg at 03/11/20 2119  . rivaroxaban (XARELTO) tablet 20 mg  20 mg Oral QHS Nelida Meuse III, MD   20 mg at 03/11/20 2118  . rosuvastatin (CRESTOR) tablet 40 mg  40 mg Oral Daily Samella Parr, NP   40 mg at 03/12/20 0859  . thiamine tablet 100 mg  100 mg Oral Daily Eulogio Bear U, DO   100 mg at 03/12/20 0858  . tiZANidine (ZANAFLEX) tablet 4 mg  4 mg Oral Q8H PRN Samella Parr, NP   4 mg at 03/12/20 0935  . vitamin B-12 (CYANOCOBALAMIN) tablet 1,000 mcg  1,000 mcg Oral Daily Eulogio Bear U, DO   1,000 mcg at 03/12/20 6045    Musculoskeletal: Strength & Muscle Tone: increased Gait & Station: normal Patient leans: N/A  Psychiatric Specialty Exam: Physical Exam Vitals and nursing note reviewed.  Constitutional:      Appearance: Normal appearance.  HENT:     Head: Normocephalic.     Nose: Nose normal.  Pulmonary:     Effort: Pulmonary effort is normal.  Musculoskeletal:        General: Normal range of motion.     Cervical back: Normal range of motion.  Neurological:     General: No focal deficit present.     Mental Status: He is alert and oriented to person, place, and time.  Psychiatric:        Attention and Perception: Perception normal.        Mood and Affect: Mood is anxious and depressed.        Speech: Speech normal.        Behavior: Behavior is cooperative.        Thought Content: Thought content includes suicidal ideation. Thought content includes suicidal plan.        Cognition and Memory: Cognition and memory normal.        Judgment: Judgment is impulsive.     Review of Systems  Gastrointestinal: Positive for nausea.  Psychiatric/Behavioral: Positive for dysphoric mood, self-injury and suicidal ideas. The patient is nervous/anxious.   All other systems reviewed and are negative.   Blood pressure (!) 147/73, pulse 76, temperature 98.1 F (36.7 C), temperature source Oral, resp. rate 20, height 6' (1.829 m), weight 103.4 kg, SpO2 100 %.Body mass index is 30.92 kg/m.  General Appearance: Casual and Fairly Groomed  Eye Contact:  Fair  Speech:  Clear and Coherent and Normal  Rate  Volume:  Normal  Mood:  Depressed, anxious  Affect:  Appropriate and Congruent  Thought Process:  Coherent, Linear and Descriptions of Associations: Intact  Orientation:  Full (Time, Place, and Person)  Thought Content:  Logical  Suicidal Thoughts:   Denies at this time, however recent suicide attempt led to his current admission.  Homicidal Thoughts:  No  Memory:  Immediate;   Fair Recent;   Fair Remote;   Fair  Judgement:  Intact  Insight:  Present  Psychomotor Activity:  Normal  Concentration:  Concentration: Fair and Attention Span: Fair  Recall:  [employer] of Knowledge:  Fair  Language:  Good  Akathisia:  No  Handed:  Right  AIMS (if indicated):     Assets:  Leisure Time Resilience Social Support  ADL's:  Intact  Cognition:  WNL  Sleep:        Treatment Plan Summary: Major depressive disorder, recurrent, severe without psychosis: -Continue Lexapro 10 mg p.o. daily.  -Continue to work with social work for placement into geriatric psych facility.  Insomnia -Continue Rozerem 8 mg at bedtime  Disposition: Recommend psychiatric Inpatient admission when medically cleared.  Waylan Boga, NP 03/12/2020 2:20 PM

## 2020-03-12 NOTE — Progress Notes (Signed)
Patient has had no episodes of N/V on this shift. Prn zofran gives x1. No complaints at this time. Sitter at bedside. Patient in recliner resting. Will continue to moniotr

## 2020-03-12 NOTE — Progress Notes (Signed)
Patient A&O x 4, pleasant, able to make needs known. Suicide sitter in room at all times though patient does not voice any suicidal ideations at this time. Patient continues to experience nausea throughout the day and receives scheduled zofran TID, which seems to help. Patient is up ad lib and walks throughout the halls without difficulty. Sutures removed from bilateral wrist wounds today without difficulty; some light skin overgrowth noted (R>L) but all in all, able to remove without incidence. NO draininage, warmth or redness other than scar formation noted. Areas were secured with steristrips and left open to air.

## 2020-03-12 NOTE — Progress Notes (Signed)
[facility] PROGRESS NOTE  David Oneill KPT:[id number] DOB: 01-10-1956 DOA: 02/21/2020 PCP: [name])    10/21: [place] sitting up in chair after performing ADLs  Status: Inpatient-Remains inpatient appropriate because:Altered mental status, Unsafe d/c plan, IV treatments appropriate due to intensity of illness or inability to take PO and Inpatient level of care appropriate due to severity of illness   Dispo: The patient is from: Home              Anticipated d/c is to: Inpatient Geri Psych unit              Anticipated d/c date is: > 3 days              Patient currently is medically stable to d/c.  [place].  Needs Geripsych placement 2/2 presenting with suicide attempt  Code Status: Full Family Communication: Patient only DVT prophylaxis: Xarelto Vaccination status: Fully vaccinated against Covid as of July 4682  HPI: 64 year old male admitted with suicidal intent and presented with drug overdose with oxycodone and Restoril along with wrist lacerations.  Patient initially admitted by PCCM.  CT head was noted to have a small occipital intraparenchymal hemorrhage and subarachnoid extension.  Psychiatry consulted and recommended inpatient psych.  Patient now awaiting [facility] psych placement.  Subjective: Sleeping.  Awakened briefly.  No complaints verbalized.  Objective: Vitals:   03/11/20 2013 03/12/20 0645  BP: (!) 141/76 (!) 144/78  Pulse: 78 77  Resp: 15 15  Temp: 98.7 F (37.1 C) 98.4 F (36.9 C)  SpO2: 97% 100%    Intake/Output Summary (Last 24 hours) at 03/12/2020 1139 Last data filed at 03/12/2020 0900 Gross per 24 hour  Intake 840 ml  Output 1200 ml  Net -360 ml   Filed Weights   03/10/20 0457 03/11/20 0653 03/12/20 0312  Weight: 103.4 kg 102.7 kg 103.4 kg    Exam: Constitutional: NAD, awakened, comfortable Respiratory: clear to auscultation bilaterally, no wheezing, no crackles. Normal respiratory effort. No accessory muscle use.   Room air Cardiovascular: Irregular rate and rhythm underlying atrial fibrillation, no murmurs / rubs / gallops. No extremity edema.  Abdomen: no tenderness, no masses palpated. No hepatosplenomegaly. Bowel sounds positive.  Musculoskeletal: no clubbing / cyanosis. No joint deformity upper and lower extremities. Good ROM, no contractures. Normal muscle tone.  Skin: no rashes, lesions, ulcers. No induration Neurologic: CN 2-12 grossly intact. Sensation intact, DTR normal. Strength 5/5 x all 4 extremities.  Psychiatric: Alert and oriented x 3.  Flat affect.   Assessment/Plan: Non-aneurysmal small left occipital intraparenchymal hemorrhage with subarachnoid extension/ Acute toxic encephalopathy  -Secondary to drug overdose with oxycodone and Restoril along with trauma in the light of anticoagulant (Xarelto) use -OP geriatric psych documentation patient has a history of polypharmacy and alcohol abuse as well with last alcohol use about 4 years ago -Xarelto reversed with Kcentra in the ED;Neurosurgery consulted;resumedxarelto 10/11 follow-up CT head unremarkable  Reported history of Wernicke's encephalopathy/history of memory loss/chronic insomnia -Has also been followed by [name]: Differential during evaluation September 2021 included Alzheimer's dementia, frontotemporal alcoholic dementia, pseudodementia with depression or possible vascular etiology -September 2021 he was started on mirtazapine 15 mg HS by [name] psych-given symptoms complicated by ongoing depression and insomnia -MRI of the brain was ordered at that September visit -he was also referred to Dr. Paula Libra with geriatric psychiatry-it does not appear that MRI was completed nor has patient followed up with outpatient [facility] psych  -  TSH, RPR, and HIV negative. Iron low and will be replaced.  Suicide attempt - bilateral wrist laceration and intentional drug overdose -psych evaluated and recommended  inpatient geri-psych placement -Continue suicide precautions-psychiatry documents high risk for suicide since lived alone prior to admission -LCSW request routine psych follow-up every 72 hours; as of 10/25 we will also repeat electrolytes and CBC.  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  Recommending continuing Lexapro (had previously been on Cymbalta but had self discontinued this medication prior to September visit with [facility] psychiatrist and was subsequently started on Remeron). -During this hospitalization Remeron and melatonin were discontinued.   -Rozerem was started at bedtime.  Also documented patient has had chronic issues with ongoing insomnia -Wrist lacerations repaired on 10/5-well out of the window for suture removal therefore as of 10/25 will have nursing staff remove sutures if not already done  Protein calorie malnutrition Nutrition Status: Nutrition Problem: Inadequate oral intake Etiology: decreased appetite Signs/Symptoms: meal completion < 50% Interventions: Ensure Enlive (each supplement provides 350kcal and 20 grams of protein), MVI  Iron deficiency anemia -Likely related to poor nutrition in context of recurrent nausea since June -Iron 38 and was given 1000 mg of IV iron on 93/23  Chronic diastolic heart failure/pulmonary hypertension/TR -Currently appears to be euvolemic and compensated -Continue to monitor I's and O's, daily weights -Has been followed by outpatient cardiology for this as well as atrial fibrillation -Echocardiogram 12/31/19 from [facility] instead preserved EF 55 to 60% with mild aortic regurgitation and mild to moderate TR with RVSP 42 mmHg and mild pulmonary hypertension, aortic sinus moderately dilated at 4.9 cm and a moderately dilated ascending  aorta 5.0 cm  Chronic atrial fibrillation/flutter -Continue Xarelto -Rate controlled on metoprolol -Followed by cardiology and EP at [facility] clinics  Recent Massive PE -Hospitalized in July of this year at [facility] non-STEMI characterized as demand ischemia at same time -Continue Xarelto  CLL -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 Joan Mayans at [facility]; patient remains in observational status without any active treatments  Obesity/OSA -BMI 31.33  -Patient will need to follow-up with his PCP on discharge and discuss lifestyle modifications -It appears patient underwent sleep study in the past few months but apparently [employer] denied CPAP  Chronic nausea -In review of outside records patient has been having these symptoms since June 2021 -Tolerating regular diet after SLP evaluation including MBSS demonstrated no evidence of dysphagia or other swallowing abnormalities -EGD this admission with esophageal plaques pathology negative for Candida. Mild duodenal bulb erythema and mildly congested mucosa in the second end of the duodenum pathology demonstrating acute inflammation.  -Continue PPI.  Discontinued famotidine and Reglan.   -Continue Zofran (on scheduled as well as prn for breakthrough). -Of note patient has been chronically using hemp flower and CBD oil as well as CBD vaping to assist with nausea symptoms-states has not used marijuana after returning to [place] from [place] neck pain -Followed by Dr. Celedonio Miyamoto at the clinics at [facility] with mild to moderate degenerative disc disease with mild to severe facet joint arthrosis contributing to multilevel mild to severe neural foraminal stenosis and mild to moderate acquired spinal canal stenosis -Recommendation for PT    Data Reviewed: Basic Metabolic Panel:  Recent  Labs  Lab 03/07/20 0428  NA 136  K 3.9  CL 102  CO2 25  GLUCOSE 107*  BUN 14  CREATININE 1.04  CALCIUM 9.3   Liver Function Tests: Recent Labs  Lab 03/07/20 0428  AST 18  ALT 14  ALKPHOS 56  BILITOT 1.0  PROT 6.3*  ALBUMIN 3.6   No results for input(s): LIPASE, AMYLASE in the last 168 hours. No results for input(s): AMMONIA in the last 168 hours. CBC: Recent Labs  Lab 03/07/20 0428  WBC 18.6*  HGB 12.1*  HCT 36.8*  MCV 87.2  PLT 252   Cardiac Enzymes: No results for input(s): CKTOTAL, CKMB, CKMBINDEX, TROPONINI in the last 168 hours. BNP (last 3 results) Recent Labs    02/21/20 1144  BNP 359.3*    ProBNP (last 3 results) No results for input(s): PROBNP in the last 8760 hours.  CBG: Recent Labs  Lab 03/06/20 0649  GLUCAP 102*    No results found for this or any previous visit (from the past 240 hour(s)).   Studies: No results found.  Scheduled Meds:  escitalopram  10 mg Oral Daily   feeding supplement  237 mL Oral BID BM   metoprolol tartrate  12.5 mg Oral BID   mometasone-formoterol  2 puff Inhalation BID   multivitamin with minerals  1 tablet Oral Daily   ondansetron  4 mg Oral TID WC & HS   pantoprazole  40 mg Oral BID   ramelteon  8 mg Oral QHS   rivaroxaban  20 mg Oral QHS   rosuvastatin  40 mg Oral Daily   thiamine  100 mg Oral Daily   cyanocobalamin  1,000 mcg Oral Daily   Continuous Infusions:   Active Problems:   SAH (subarachnoid hemorrhage) ([facility])   Suicide attempt ([place])   Intentional drug overdose ([place])   CLL (chronic lymphocytic leukemia) ([facility])   Major depressive disorder, recurrent severe without psychotic features ([place])   Chronic nausea   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 If 7PM-7AM, please contact night-coverage at [email or web address] 03/12/2020, 11:39 AM  LOS: 20 days          .

## 2020-03-13 DIAGNOSIS — F332 Major depressive disorder, recurrent severe without psychotic features: Secondary | ICD-10-CM | POA: Diagnosis not present

## 2020-03-13 DIAGNOSIS — T1491XA Suicide attempt, initial encounter: Secondary | ICD-10-CM | POA: Diagnosis not present

## 2020-03-13 DIAGNOSIS — S61512D Laceration without foreign body of left wrist, subsequent encounter: Secondary | ICD-10-CM | POA: Diagnosis not present

## 2020-03-13 DIAGNOSIS — T50902A Poisoning by unspecified drugs, medicaments and biological substances, intentional self-harm, initial encounter: Secondary | ICD-10-CM | POA: Diagnosis not present

## 2020-03-13 DIAGNOSIS — M549 Dorsalgia, unspecified: Secondary | ICD-10-CM

## 2020-03-13 LAB — CBC
HCT: 39.4 % (ref 39.0–52.0)
Hemoglobin: 12.4 g/dL — ABNORMAL LOW (ref 13.0–17.0)
MCH: 28.2 pg (ref 26.0–34.0)
MCHC: 31.5 g/dL (ref 30.0–36.0)
MCV: 89.7 fL (ref 80.0–100.0)
Platelets: 215 10*3/uL (ref 150–400)
RBC: 4.39 MIL/uL (ref 4.22–5.81)
RDW: 15.2 % (ref 11.5–15.5)
WBC: 14.2 10*3/uL — ABNORMAL HIGH (ref 4.0–10.5)
nRBC: 0 % (ref 0.0–0.2)

## 2020-03-13 LAB — COMPREHENSIVE METABOLIC PANEL
ALT: 13 U/L (ref 0–44)
AST: 18 U/L (ref 15–41)
Albumin: 3.8 g/dL (ref 3.5–5.0)
Alkaline Phosphatase: 66 U/L (ref 38–126)
Anion gap: 8 (ref 5–15)
BUN: 16 mg/dL (ref 8–23)
CO2: 27 mmol/L (ref 22–32)
Calcium: 9.5 mg/dL (ref 8.9–10.3)
Chloride: 102 mmol/L (ref 98–111)
Creatinine, Ser: 1.12 mg/dL (ref 0.61–1.24)
GFR, Estimated: >60 mL/min (ref >60)
Glucose, Bld: 98 mg/dL (ref 70–99)
Potassium: 3.9 mmol/L (ref 3.5–5.1)
Sodium: 137 mmol/L (ref 135–145)
Total Bilirubin: 0.8 mg/dL (ref 0.3–1.2)
Total Protein: 6.6 g/dL (ref 6.5–8.1)

## 2020-03-13 MED ORDER — DICLOFENAC SODIUM 1 % EX GEL
4.0000 g | Freq: Four times a day (QID) | CUTANEOUS | Status: DC
  Administered 2020-03-13 – 2020-03-27 (×53): 4 g via TOPICAL
  Filled 2020-03-13 (×2): qty 100

## 2020-03-13 MED ORDER — IBUPROFEN 200 MG PO TABS
400.0000 mg | ORAL_TABLET | Freq: Every evening | ORAL | Status: DC | PRN
  Administered 2020-03-14 – 2020-03-18 (×6): 400 mg via ORAL
  Filled 2020-03-13 (×6): qty 2

## 2020-03-13 NOTE — Social Work (Signed)
CSW resent updated clinicals to [facility], there is currently a waiting list. [facility] stated they would also send clinicals to their sister company, [place] ([phone]), CSW will follow up with [facility].

## 2020-03-13 NOTE — Progress Notes (Signed)
Pt is restless. In and out of bed, pacing back and forth, lying in the floor doing back exercising. Pt states none of his meds are working and doesn't know what to do

## 2020-03-13 NOTE — Progress Notes (Signed)
[facility] PROGRESS NOTE  Montrez Marietta IPJ:[id number] DOB: [birth year] DOA: 02/21/2020 PCP: [name])    10/21: [place] sitting up in chair after performing ADLs  Status: Inpatient-Remains inpatient appropriate because:Altered mental status, Unsafe d/c plan, IV treatments appropriate due to intensity of illness or inability to take PO and Inpatient level of care appropriate due to severity of illness   Dispo: The patient is from: Home              Anticipated d/c is to: Inpatient Geri Psych unit              Anticipated d/c date is: > 3 days              Patient currently is medically stable to d/c.  [place].  Needs Geripsych placement 2/2 presenting with suicide attempt-no geripsych beds available thru [facility]  Code Status: Full Family Communication: Patient only DVT prophylaxis: Xarelto Vaccination status: Fully vaccinated against Covid as of July 5257  HPI: 64 year old male admitted with suicidal intent and presented with drug overdose with oxycodone and Restoril along with wrist lacerations.  Patient initially admitted by PCCM.  CT head was noted to have a small occipital intraparenchymal hemorrhage and subarachnoid extension.  Psychiatry consulted and recommended inpatient psych.  Patient now awaiting [facility] psych placement.  Subjective: Awake and sitting up in chair.  States unable to sleep secondary to back pain while in bed.  Not relieved with Flexeril.  Reports significant improvement in similar pain at home with use of Voltaren gel.  Objective: Vitals:   03/12/20 2202 03/13/20 0528  BP: (!) 144/83 133/72  Pulse: 62 73  Resp:  18  Temp:  98.4 F (36.9 C)  SpO2: 100% 98%    Intake/Output Summary (Last 24 hours) at 03/13/2020 1011 Last data filed at 03/12/2020 1400 Gross per 24 hour  Intake 120 ml  Output 200 ml  Net -80 ml   Filed Weights   03/11/20 0653 03/12/20 0312 03/13/20 0216  Weight: 102.7 kg 103.4 kg 103 kg     Exam: Constitutional: NAD, awakened, comfortable Respiratory: clear to auscultation bilaterally, no wheezing, no crackles. Normal respiratory effort. No accessory muscle use.  Room air Cardiovascular: Irregular rate and rhythm underlying atrial fibrillation, no murmurs / rubs / gallops. No extremity edema.  Abdomen: no tenderness, no masses palpated. No hepatosplenomegaly. Bowel sounds positive.  Musculoskeletal: no clubbing / cyanosis. No joint deformity upper and lower extremities. Good ROM, no contractures. Normal muscle tone.  Skin: no rashes, lesions, ulcers. No induration Neurologic: CN 2-12 grossly intact. Sensation intact, DTR normal. Strength 5/5 x all 4 extremities.  Psychiatric: Alert and oriented x 3.  Flat affect.   Assessment/Plan: Non-aneurysmal small left occipital intraparenchymal hemorrhage with subarachnoid extension/ Acute toxic encephalopathy  -Secondary to drug overdose with oxycodone and Restoril along with trauma in the light of anticoagulant (Xarelto) use -OP geriatric psych documentation patient has a history of polypharmacy and alcohol abuse as well with last alcohol use about 4 years ago -Xarelto reversed with Kcentra in the ED;Neurosurgery consulted;resumedxarelto 10/11 follow-up CT head unremarkable  Reported history of Wernicke's encephalopathy/history of memory loss/chronic insomnia -Has also been followed by [name] psych at [facility]: Differential during evaluation September 2021 included Alzheimer's dementia, frontotemporal alcoholic dementia, pseudodementia with depression or possible vascular etiology -September 2021 he was started on mirtazapine 15 mg HS by [name] psych-given symptoms complicated by ongoing depression and insomnia -MRI of the brain was ordered  at that September visit -he was also referred to Dr. Paula Libra with geriatric psychiatry-it does not appear that MRI was completed nor has patient followed up with outpatient Geri  psych  -TSH, RPR, and HIV negative. Iron low and will be replaced.  Suicide attempt - bilateral wrist laceration and intentional drug overdose -IP psych evaluated and recommended inpatient geri-psych placement-unfortunately [facility] does not have this type of bed available in our behavioral health inpatient unit -Continue suicide precautions with safety sitter at bedside-psychiatry documents high risk for suicide since lived alone prior to admission -LCSW request routine psych follow-up every 72 hours; 10/25 repeat electrolytes and CBC unremarkable.  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  Recommend continuing Lexapro (had previously been on Cymbalta but had self discontinued this medication prior to September visit with [facility] psychiatrist and was subsequently started on Remeron).  -During this hospitalization Remeron and melatonin were discontinued and Rozerem was started at bedtime. (OP documentation re: chronic insomnia) -Wrist lacerations repaired on 10/5 sutures removed on 10/25 with incisions unremarkable in appearance  Insomnia/back pain -See below regarding current interventions in place for chronic neck pain -Patient reports improvement in previous type back pain at home with Voltaren gel so this has been ordered -Ordered ibuprofen 400 mg daily HS prn if Voltaren gel ineffective  Protein calorie malnutrition Nutrition Status: Nutrition Problem: Inadequate oral intake Etiology: decreased appetite Signs/Symptoms: meal completion < 50% Interventions: Ensure Enlive (each supplement provides 350kcal and 20 grams of protein), MVI  Iron deficiency anemia -Likely related to poor nutrition in context of recurrent nausea since June -Iron 38 and was given 1000 mg of IV iron on 10/21  Chronic  diastolic heart failure/pulmonary hypertension/TR -Currently appears to be euvolemic and compensated -Continue to monitor I's and O's, daily weights -Has been followed by outpatient cardiology for this as well as atrial fibrillation -Echocardiogram 12/31/19 from [facility] instead preserved EF 55 to 60% with mild aortic regurgitation and mild to moderate TR with RVSP 42 mmHg and mild pulmonary hypertension, aortic sinus moderately dilated at 4.9 cm and a moderately dilated ascending aorta 5.0 cm  Chronic atrial fibrillation/flutter -Continue Xarelto -Rate controlled on metoprolol -Followed by cardiology and EP at [facility]  Recent Massive PE -Hospitalized in July of this year at [facility] non-STEMI characterized as demand ischemia at same time -Continue Xarelto  CLL -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 Joan Mayans at [facility]; patient remains in observational status without any active treatments  Obesity/OSA -BMI 31.33  -Patient will need to follow-up with his PCP on discharge and discuss lifestyle modifications -It appears patient underwent sleep study in the past few months but apparently [employer] denied CPAP  Chronic nausea -In review of outside records patient has been having these symptoms since June 2021 -Tolerating regular diet after SLP evaluation including MBSS demonstrated no evidence of dysphagia or other swallowing abnormalities -EGD this admission with esophageal plaques pathology negative for Candida. Mild duodenal bulb erythema and mildly congested mucosa in the second end of the duodenum pathology demonstrating acute inflammation.  -Continue PPI.  Discontinued famotidine and Reglan.   -Continue Zofran (on scheduled as well as prn for breakthrough). -Of note patient has been chronically using hemp flower and CBD oil as well as CBD vaping to assist with nausea  symptoms-states has  not used marijuana after returning to [place] from [place]  Chronic neck pain -Followed by Dr. Celedonio Miyamoto at the clinics at [facility] -Recent MRI with mild to moderate degenerative disc disease with mild to severe facet joint arthrosis contributing to multilevel mild to severe neural foraminal stenosis and mild to moderate acquired spinal canal stenosis -Recommendation for PT    Data Reviewed: Basic Metabolic Panel: Recent Labs  Lab 03/07/20 0428 03/13/20 0256  NA 136 137  K 3.9 3.9  CL 102 102  CO2 25 27  GLUCOSE 107* 98  BUN 14 16  CREATININE 1.04 1.12  CALCIUM 9.3 9.5   Liver Function Tests: Recent Labs  Lab 03/07/20 0428 03/13/20 0256  AST 18 18  ALT 14 13  ALKPHOS 56 66  BILITOT 1.0 0.8  PROT 6.3* 6.6  ALBUMIN 3.6 3.8   No results for input(s): LIPASE, AMYLASE in the last 168 hours. No results for input(s): AMMONIA in the last 168 hours. CBC: Recent Labs  Lab 03/07/20 0428 03/13/20 0256  WBC 18.6* 14.2*  HGB 12.1* 12.4*  HCT 36.8* 39.4  MCV 87.2 89.7  PLT 252 215   Cardiac Enzymes: No results for input(s): CKTOTAL, CKMB, CKMBINDEX, TROPONINI in the last 168 hours. BNP (last 3 results) Recent Labs    02/21/20 1144  BNP 359.3*    ProBNP (last 3 results) No results for input(s): PROBNP in the last 8760 hours.  CBG: No results for input(s): GLUCAP in the last 168 hours.  No results found for this or any previous visit (from the past 240 hour(s)).   Studies: No results found.  Scheduled Meds: . diclofenac Sodium  4 g Topical QID  . escitalopram  10 mg Oral Daily  . feeding supplement  237 mL Oral BID BM  . metoprolol tartrate  12.5 mg Oral BID  . mometasone-formoterol  2 puff Inhalation BID  . multivitamin with minerals  1 tablet Oral Daily  . ondansetron  4 mg Oral TID WC & HS  . pantoprazole  40 mg Oral BID  . ramelteon  8 mg Oral QHS  . rivaroxaban  20 mg Oral QHS  .  rosuvastatin  40 mg Oral Daily  . thiamine  100 mg Oral Daily  . 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 If 7PM-7AM, please contact night-coverage at [email or web address] 03/13/2020, 10:11 AM  LOS: 21 days          .

## 2020-03-13 NOTE — Progress Notes (Signed)
Nutrition Follow-up  RD working remotely.  DOCUMENTATION CODES:   Obesity unspecified  INTERVENTION:   -Continue Ensure Enlive po BID, each supplement provides 350 kcal and 20 grams of protein -Continue MVI with minerals daily -Continue Magic cup TID with meals, each supplement provides 290 kcal and 9 grams of protein  NUTRITION DIAGNOSIS:   Inadequate oral intake related to decreased appetite as evidenced by meal completion < 50%.  Ongoing  GOAL:   Patient will meet greater than or equal to 90% of their needs  Progressing   MONITOR:   PO intake, Supplement acceptance, Weight trends, Skin, Labs, I & O's  REASON FOR ASSESSMENT:   Malnutrition Screening Tool    ASSESSMENT:   Pt admitted with AMS 2/2 intentional drug OD and head trauma after fall. Pt also slit both wrists. PMH includes A.fib, s/p ablation x2, restrictive lung disease, OSA, HTN, large PEs 11/2019 prompting NSTEMI, CLL, and h/o EtOH abuse. 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  10/19- s/p EGD- revealed esophageal plaques suspicious for candidiasis (biopsied), mild duodenal bulb erythema and mildly congested mucosa in second portion of duodenum 10/20- s/p BSE- advanced to dysphagia 3 diet with thin liquids 10/21- s/p MBSS- advanced to regular diet with thin liquids 10/25- sutures removed from bilateral wrist wounds  Reviewed I/O's: -40 ml x 24 hours and -16.1 L since 03/08/20  UOP: 400 ml x 24 hours  Attempted to speak with pt via call to hospital room phone, however, no answer.   Pt remains with good appetite; noted meal completion 25-100%. He is not complaining of nausea per chart review. Ensure Enlive supplements ordered on 03/08/20 and pt is consuming them per [facility].   Per MD notes, pt remains inpatient due to need for inpatient geri-psych unit.   Medications reviewed and include vitamin B-12 and thiamine.  Labs reviewed.   Diet Order:   Diet Order             Diet regular Room service appropriate? Yes; Fluid consistency: Thin  Diet effective now                 EDUCATION NEEDS:   Not appropriate for education at this time  Skin:  Skin Assessment: Skin Integrity Issues: Skin Integrity Issues:: Other (Comment) Other: lacerations bilateral wrists  Last BM:  03/12/20  Height:   Ht Readings from Last 1 Encounters:  02/28/20 6' (1.829 m)    Weight:   Wt Readings from Last 1 Encounters:  03/13/20 103 kg    Ideal Body Weight:  80.91 kg  BMI:  Body mass index is 30.79 kg/m.  Estimated Nutritional Needs:   Kcal:  2200-2400  Protein:  120-135 grams  Fluid:  > 2 L    Loistine Chance, RD, LDN, [place] Registered Dietitian II Certified Diabetes Care and Education Specialist Please refer to [facility] for RD and/or RD on-call/weekend/after hours pager

## 2020-03-14 DIAGNOSIS — F332 Major depressive disorder, recurrent severe without psychotic features: Secondary | ICD-10-CM | POA: Diagnosis not present

## 2020-03-14 DIAGNOSIS — M549 Dorsalgia, unspecified: Secondary | ICD-10-CM | POA: Diagnosis not present

## 2020-03-14 DIAGNOSIS — T50902D Poisoning by unspecified drugs, medicaments and biological substances, intentional self-harm, subsequent encounter: Secondary | ICD-10-CM | POA: Diagnosis not present

## 2020-03-14 DIAGNOSIS — S61512D Laceration without foreign body of left wrist, subsequent encounter: Secondary | ICD-10-CM | POA: Diagnosis not present

## 2020-03-14 DIAGNOSIS — G2581 Restless legs syndrome: Secondary | ICD-10-CM

## 2020-03-14 MED ORDER — DULOXETINE HCL 20 MG PO CPEP
20.0000 mg | ORAL_CAPSULE | Freq: Every day | ORAL | Status: DC
  Administered 2020-03-14 – 2020-03-17 (×4): 20 mg via ORAL
  Filled 2020-03-14 (×4): qty 1

## 2020-03-14 MED ORDER — ROPINIROLE HCL 1 MG PO TABS
2.0000 mg | ORAL_TABLET | Freq: Every day | ORAL | Status: DC
  Administered 2020-03-14 – 2020-03-26 (×13): 2 mg via ORAL
  Filled 2020-03-14 (×14): qty 2

## 2020-03-14 NOTE — Consult Note (Signed)
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  Upon entering the room patient was observed to be sleeping, lights were off and blinds were closed.   He continues to endorse poor sleep, decreased appetite, and sadness.  Today during the evaluation he continues to ruminate on his poor sleep, and his apparent psychomotor retardation.  Patient states he is unable to get up and move about, and participate in normal day-to-day activities due to his inability to sleep.  He does show some improved insight noting" I could benefit from talking to someone however I am too sleepy to stay awake. "  Patient is observed to fall asleep and drift backwards backwards into the bed while speaking with Probation officer.  He also reports some changes were made to his medication by his primary team for his depression and his chronic pain.  He currently denies any suicidal thoughts, homicidal thoughts, and or hallucinations.  He continues to endorse seeking help for his depression and anxiety, and ruminating thoughts.    Patient remains high risk for suicide completion for multiple reasons.  HPI per MD: 64 year old male with suicidal/homicidal intent, presented after drug overdose with oxycodone and Restoril and was  noted to have both a wrist lacerations.  CT head was noted with small occipital intraparenchymal hemorrhage with subarachnoid extension.  Past Psychiatric History: depression  Risk to Self:  yes Risk to Others:  none Prior Inpatient Therapy:  denies Prior Outpatient Therapy:  none  Past Medical History:  Past Medical History:  Diagnosis Date  . Chronic back pain   . Sleep apnea   . Suicide attempt ([place]) 02/22/2020    Past Surgical History:  Procedure Laterality Date  . ABLATION OF DYSRHYTHMIC FOCUS    . BIOPSY  03/06/2020   Procedure: BIOPSY;  Surgeon: Ladene Artist, MD;  Location[facility] END[facility]COPY;  Service: Gastroenterology;;  . ESOPHAGOGASTRODUODENOSCOPY N/A 03/06/2020   Procedure: ESOPHAGOGASTRODUODENOSCOPY (EGD);  Surgeon: Ladene Artist, MD;  Location: [place];  Service: Gastroenterology;  Laterality: N/A;   Family History: History reviewed. No pertinent family history. Family Psychiatric  History: none Social History:  Social History   Substance and Sexual Activity  Alcohol Use Not Currently     Social History   Substance and Sexual Activity  Drug Use Yes  . Types: Marijuana    Social History   Socioeconomic History  . Marital status: Single    Spouse name: Not on file  . Number of children: Not on file  . Years of education: Not on file  . Highest education level: Not on file  Occupational History  . Not on file  Tobacco Use  . Smoking status: Never Smoker  . Smokeless tobacco: Never Used  Vaping Use  . Vaping Use: Never used  Substance and Sexual Activity  . Alcohol use: Not Currently  .  Drug use: Yes    Types: Marijuana  . Sexual activity: Not on file  Other Topics Concern  . Not on file  Social History Narrative  . Not on file   Social Determinants of Health   Financial Resource Strain:   . Difficulty of Paying Living Expenses: Not on file  Food Insecurity:   . Worried About Charity fundraiser in the Last Year: Not on file  . Ran  Out of Food in the Last Year: Not on file  Transportation Needs:   . Lack of Transportation (Medical): Not on file  . Lack of Transportation (Non-Medical): Not on file  Physical Activity:   . Days of Exercise per Week: Not on file  . Minutes of Exercise per Session: Not on file  Stress:   . Feeling of Stress : Not on file  Social Connections:   . Frequency of Communication with Friends and Family: Not on file  . Frequency of Social Gatherings with Friends and Family: Not on file  . Attends Religious Services: Not on file  . Active Member of Clubs or Organizations: Not on file  . Attends Archivist Meetings: Not on file  . Marital Status: Not on file   Additional Social History:    Allergies:  Not on File  Labs:  Results for orders placed or performed during the hospital encounter of 02/21/20 (from the past 48 hour(s))  Comprehensive metabolic panel     Status: None   Collection Time: 03/13/20  2:56 AM  Result Value Ref Range   Sodium 137 135 - 145 mmol/L   Potassium 3.9 3.5 - 5.1 mmol/L   Chloride 102 98 - 111 mmol/L   CO2 27 22 - 32 mmol/L   Glucose, Bld 98 70 - 99 mg/dL    Comment: Glucose reference range applies only to samples taken after fasting for at least 8 hours.   BUN 16 8 - 23 mg/dL   Creatinine, Ser 1.12 0.61 - 1.24 mg/dL   Calcium 9.5 8.9 - 10.3 mg/dL   Total Protein 6.6 6.5 - 8.1 g/dL   Albumin 3.8 3.5 - 5.0 g/dL   AST 18 15 - 41 U/L   ALT 13 0 - 44 U/L   Alkaline Phosphatase 66 38 - 126 U/L   Total Bilirubin 0.8 0.3 - 1.2 mg/dL   GFR, Estimated >60 >60 mL/min    Comment: (NOTE) Calculated using the CKD-EPI Creatinine Equation (2021)    Anion gap 8 5 - 15    Comment: Performed at [facility] [address] [place], [place] [address]  CBC     Status: Abnormal   Collection Time: 03/13/20  2:56 AM  Result Value Ref Range   WBC 14.2 (H) 4.0 - 10.5 K/uL   RBC 4.39 4.22 - 5.81 MIL/uL   Hemoglobin 12.4 (L) 13.0 - 17.0 g/dL   HCT 39.4 39  - 52 %   MCV 89.7 80.0 - 100.0 fL   MCH 28.2 26.0 - 34.0 pg   MCHC 31.5 30.0 - 36.0 g/dL   RDW 15.2 11.5 - 15.5 %   Platelets 215 150 - 400 K/uL   nRBC 0.0 0.0 - 0.2 %    Comment: Performed at [facility], [place] [address] [place], [place] [address]    Current Facility-Administered Medications  Medication Dose Route Frequency Provider Last Rate Last Admin  . acetaminophen (TYLENOL) tablet 650 mg  650 mg Oral Q6H PRN Jose Persia,  MD   650 mg at 03/13/20 2105  . albuterol (PROVENTIL) (2.5 MG/3ML) 0.083% nebulizer solution 2.5 mg  2.5 mg Nebulization Q2H PRN Magdalen Spatz, NP      . diclofenac Sodium (VOLTAREN) 1 % topical gel 4 g  4 g Topical QID Samella Parr, NP   4 g at 03/14/20 1020  . docusate sodium (COLACE) capsule 100 mg  100 mg Oral BID PRN Magdalen Spatz, NP   100 mg at 03/13/20 2105  . DULoxetine (CYMBALTA) DR capsule 20 mg  20 mg Oral Daily Samella Parr, NP      . feeding supplement (ENSURE ENLIVE / ENSURE PLUS) liquid 237 mL  237 mL Oral BID BM Samella Parr, NP   237 mL at 03/14/20 1142  . fluticasone (FLONASE) 50 MCG/ACT nasal spray 2 spray  2 spray Each Nare Daily PRN Eulogio Bear U, DO   2 spray at 03/12/20 2142  . ibuprofen (ADVIL) tablet 400 mg  400 mg Oral QHS PRN Samella Parr, NP   400 mg at 03/14/20 0318  . metoprolol tartrate (LOPRESSOR) tablet 12.5 mg  12.5 mg Oral BID Eulogio Bear U, DO   12.5 mg at 03/14/20 1020  . mometasone-formoterol (DULERA) 100-5 MCG/ACT inhaler 2 puff  2 puff Inhalation BID Jose Persia, MD   2 puff at 03/14/20 0820  . multivitamin with minerals tablet 1 tablet  1 tablet Oral Daily Jacky Kindle, MD   1 tablet at 03/14/20 1020  . naloxone [facility]) injection 0.4 mg  0.4 mg Intravenous PRN Simonne Maffucci B, MD   0.4 mg at 02/21/20 2202  . ondansetron (ZOFRAN) injection 4 mg  4 mg Intravenous Q6H PRN Magdalen Spatz, NP   4 mg at 03/11/20 1536  . ondansetron (ZOFRAN-ODT) disintegrating tablet 4 mg  4 mg Oral TID WC &  HS Ladene Artist, MD   4 mg at 03/14/20 1142  . pantoprazole (PROTONIX) EC tablet 40 mg  40 mg Oral BID Eulogio Bear U, DO   40 mg at 03/14/20 1021  . polyethylene glycol (MIRALAX / GLYCOLAX) packet 17 g  17 g Oral Daily PRN Magdalen Spatz, NP   17 g at 03/03/20 0818  . ramelteon (ROZEREM) tablet 8 mg  8 mg Oral QHS Patrecia Pour, NP   8 mg at 03/13/20 2105  . rivaroxaban (XARELTO) tablet 20 mg  20 mg Oral QHS Nelida Meuse III, MD   20 mg at 03/13/20 1653  . rOPINIRole (REQUIP) tablet 2 mg  2 mg Oral QHS Samella Parr, NP      . rosuvastatin (CRESTOR) tablet 40 mg  40 mg Oral Daily Erin Hearing L, NP   40 mg at 03/14/20 1020  . thiamine tablet 100 mg  100 mg Oral Daily Eulogio Bear U, DO   100 mg at 03/14/20 1021  . tiZANidine (ZANAFLEX) tablet 4 mg  4 mg Oral Q8H PRN Samella Parr, NP   4 mg at 03/14/20 1142  . vitamin B-12 (CYANOCOBALAMIN) tablet 1,000 mcg  1,000 mcg Oral Daily Eulogio Bear U, DO   1,000 mcg at 03/14/20 1020    Musculoskeletal: Strength & Muscle Tone: increased Gait & Station: normal Patient leans: N/A  Psychiatric Specialty Exam: Physical Exam Vitals and nursing note reviewed.  Constitutional:      Appearance: Normal appearance.  HENT:     Head: Normocephalic.     Nose: Nose normal.  Pulmonary:  Effort: Pulmonary effort is normal.  Musculoskeletal:        General: Normal range of motion.     Cervical back: Normal range of motion.  Neurological:     General: No focal deficit present.     Mental Status: He is alert and oriented to person, place, and time.  Psychiatric:        Attention and Perception: Perception normal.        Mood and Affect: Mood is anxious and depressed.        Speech: Speech normal.        Behavior: Behavior is cooperative.        Thought Content: Thought content includes suicidal ideation. Thought content includes suicidal plan.        Cognition and Memory: Cognition and memory normal.        Judgment: Judgment is  impulsive.     Review of Systems  Gastrointestinal: Positive for nausea.  Psychiatric/Behavioral: Positive for dysphoric mood, self-injury and suicidal ideas. The patient is nervous/anxious.   All other systems reviewed and are negative.   Blood pressure (!) 160/76, pulse 79, temperature 97.8 F (36.6 C), temperature source Oral, resp. rate 18, height 6' (1.829 m), weight 104.5 kg, SpO2 94 %.Body mass index is 31.23 kg/m.  General Appearance: Casual and Fairly Groomed  Eye Contact:  Fair  Speech:  Clear and Coherent and Normal Rate  Volume:  Normal  Mood:  Anxious, Dysphoric and Worthless, anxious  Affect:  Depressed  Thought Process:  Coherent, Linear and Descriptions of Associations: Intact  Orientation:  Full (Time, Place, and Person)  Thought Content:  Logical  Suicidal Thoughts:  Denies at this time, however recent suicide attempt led to his current admission.  Homicidal Thoughts:  No  Memory:  Immediate;   Fair Recent;   Fair Remote;   Fair  Judgement:  Intact  Insight:  Present  Psychomotor Activity:  Psychomotor Retardation  Concentration:  Concentration: Fair and Attention Span: Fair  Recall:  [employer] of Knowledge:  Fair  Language:  Good  Akathisia:  No  Handed:  Right  AIMS (if indicated):     Assets:  Leisure Time Resilience Social Support  ADL's:  Intact  Cognition:  WNL  Sleep:        Treatment Plan Summary: Major depressive disorder, recurrent, severe without psychosis: -Lexapro was discontinued by his primary team, Cymbalta has been started. -Continue to work with social work for placement into geriatric psych facility. -Patient is encouraged to stay awake during the day, increase lighting, and participate in different stimulating activities.   Insomnia -Continue Rozerem 8 mg at bedtime,  -Patient has tried multiple insomnia medications to include mirtazapine, trazodone, Lunesta, temazepam with no success.  As noted above he continues to sleep  throughout the day, and most likely has disrupted his sleep wake schedule.  We will encourage patient to remain awake as much as possible during the day to promote better sleep hygiene.  Disposition: Recommend psychiatric Inpatient admission when medically cleared.  Suella Broad, FNP 03/14/2020 12:13 PM

## 2020-03-14 NOTE — Progress Notes (Signed)
[facility] PROGRESS NOTE  [name] ZDG:[id number] DOB: Sep 21, 1955 DOA: 02/21/2020 PCP: [name])    10/21: David Oneill sitting up in chair after performing ADLs  Status: Inpatient-Remains inpatient appropriate because:Altered mental status, Unsafe d/c plan, IV treatments appropriate due to intensity of illness or inability to take PO and Inpatient level of care appropriate due to severity of illness   Dispo: The patient is from: Home              Anticipated d/c is to: Inpatient Geri Psych unit              Anticipated d/c date is: > 3 days              Patient currently is medically stable to d/c.  David Oneill.  Needs Geripsych placement 2/2 presenting with suicide attempt-no geripsych beds available thru [facility]  Code Status: Full Family Communication: Patient only DVT prophylaxis: Xarelto Vaccination status: Fully vaccinated against Covid as of July 7930  HPI: 64 year old male admitted with suicidal intent and presented with drug overdose with oxycodone and Restoril along with wrist lacerations.  Patient initially admitted by PCCM.  CT head was noted to have a small occipital intraparenchymal hemorrhage and subarachnoid extension.  Psychiatry consulted and recommended inpatient psych.  Patient now awaiting [facility] psych placement.  Subjective: Awake and sitting up in chair.  States unable to sleep secondary to back pain while in bed.  Not relieved with Flexeril.  Reports significant improvement in similar pain at home with use of Voltaren gel.  Objective: Vitals:   03/14/20 0600 03/14/20 1149  BP: 123/67 (!) 160/76  Pulse: 70 79  Resp: 20 18  Temp: 97.9 F (36.6 C) 97.8 F (36.6 C)  SpO2: 98% 94%    Intake/Output Summary (Last 24 hours) at 03/14/2020 1247 Last data filed at 03/14/2020 0128 Gross per 24 hour  Intake 600 ml  Output 1626 ml  Net -1026 ml   Filed Weights   03/12/20 0312 03/13/20 0216 03/14/20 0500  Weight: 103.4 kg 103 kg 104.5 kg      Exam: Constitutional: NAD, awakened, comfortable Respiratory: clear to auscultation bilaterally, no wheezing, no crackles. Normal respiratory effort. No accessory muscle use.  Room air Cardiovascular: Irregular rate and rhythm underlying atrial fibrillation, no murmurs / rubs / gallops. No extremity edema.  Abdomen: no tenderness, no masses palpated. No hepatosplenomegaly. Bowel sounds positive.  Musculoskeletal: no clubbing / cyanosis. No joint deformity upper and lower extremities. Good ROM, no contractures. Normal muscle tone.  Skin: no rashes, lesions, ulcers. No induration Neurologic: CN 2-12 grossly intact. Sensation intact, DTR normal. Strength 5/5 x all 4 extremities.  Psychiatric: Alert and oriented x 3.  Flat affect.   Assessment/Plan: Non-aneurysmal small left occipital intraparenchymal hemorrhage with subarachnoid extension/ Acute toxic encephalopathy  -Secondary to drug overdose with oxycodone and Restoril along with trauma in the light of anticoagulant (Xarelto) use -OP geriatric psych documentation patient has a history of polypharmacy and alcohol abuse as well with last alcohol use about 4 years ago -Xarelto reversed with Kcentra in the ED;Neurosurgery consulted;resumedxarelto 10/11 follow-up CT head unremarkable  Reported history of Wernicke's encephalopathy/history of memory loss/chronic insomnia -Has also been followed by [name] psych at [facility]: Differential during evaluation September 2021 included Alzheimer's dementia, frontotemporal alcoholic dementia, pseudodementia with depression or possible vascular etiology -September 2021 he was started on mirtazapine 15 mg HS by [name] psych-given symptoms complicated by ongoing depression and insomnia -MRI of  the brain was ordered at that September visit -he was also referred to Dr. Paula Libra with geriatric psychiatry-it does not appear that MRI was completed nor has patient followed up with outpatient Geri  psych  -TSH, RPR, and HIV negative. Iron low and will be replaced.  Suicide attempt - bilateral wrist laceration and intentional drug overdose -IP psych evaluated and recommended inpatient geri-psych placement-unfortunately [facility] does not have this type of bed available in our behavioral health inpatient unit -Continue suicide precautions with safety sitter at bedside-psychiatry documents high risk for suicide since lived alone prior to admission -LCSW request routine psych follow-up every 72 hours; 10/25 repeat electrolytes and CBC unremarkable.  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  Recommend continuing Lexapro (had previously been on Cymbalta but had self discontinued this medication prior to September visit with [facility] psychiatrist and was subsequently started on Remeron).  -During this hospitalization Remeron and melatonin were discontinued and Rozerem was started at bedtime. (OP documentation re: chronic insomnia) -Wrist lacerations repaired on 10/5 sutures removed on 10/25 with incisions unremarkable in appearance -10/27 patient requesting Cymbalta be renewed because previously prescribed to treat RLS along with Requip.  Patient has no recollection of telling providers he had voluntarily stopped this medication including conversations with providers at [facility] and at this facility since arrival. -DC Lexapro in favor of low-dose Cymbalta which will be slowly titrated up to previous dose of 60 mg/day  Insomnia/back pain -Continue Voltaren gel and if ineffective after 1 hour has ibuprofen q HS prn available   RLS -Resume preadmission Cymbalta but at much lower dose and slowly titrate up every 3 to 5 days to in dose of 60 mg daily -Requip 2 mg q. HS  Protein calorie  malnutrition Nutrition Status: Nutrition Problem: Inadequate oral intake Etiology: decreased appetite Signs/Symptoms: meal completion < 50% Interventions: Ensure Enlive (each supplement provides 350kcal and 20 grams of protein), MVI  Iron deficiency anemia -Likely related to poor nutrition in context of recurrent nausea since June -Iron 38 and was given 1000 mg of IV iron on 90/24  Chronic diastolic heart failure/pulmonary hypertension/TR -Currently appears to be euvolemic and compensated -Continue to monitor I's and O's, daily weights -Has been followed by outpatient cardiology for this as well as atrial fibrillation -Echocardiogram 12/31/19 from [facility] instead preserved EF 55 to 60% with mild aortic regurgitation and mild to moderate TR with RVSP 42 mmHg and mild pulmonary hypertension, aortic sinus moderately dilated at 4.9 cm and a moderately dilated ascending aorta 5.0 cm  Chronic atrial fibrillation/flutter -Continue Xarelto -Rate controlled on metoprolol -Followed by cardiology and EP at [facility]  Recent Massive PE -Hospitalized in July of this year at [facility] non-STEMI characterized as demand ischemia at same time -Continue Xarelto  CLL -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 Joan Mayans at [facility]; patient remains in observational status without any active treatments  Obesity/OSA -BMI 31.33  -Patient will need to follow-up with his PCP on discharge and discuss lifestyle modifications -It appears patient underwent sleep study in the past few months but apparently [employer] denied CPAP  Chronic nausea -In review of outside records patient has been having these symptoms since June 2021 -Tolerating regular diet after SLP evaluation including MBSS demonstrated no evidence of dysphagia or other swallowing abnormalities -EGD this  admission with esophageal plaques  pathology negative for Candida. Mild duodenal bulb erythema and mildly congested mucosa in the second end of the duodenum pathology demonstrating acute inflammation.  -Continue PPI.  Discontinued famotidine and Reglan.   -Continue Zofran (on scheduled as well as prn for breakthrough). -Of note patient has been chronically using hemp flower and CBD oil as well as CBD vaping to assist with nausea symptoms-states has not used marijuana after returning to [place] from [place] pain -Followed by Dr. Celedonio Miyamoto at the clinics at [facility] with mild to moderate degenerative disc disease with mild to severe facet joint arthrosis contributing to multilevel mild to severe neural foraminal stenosis and mild to moderate acquired spinal canal stenosis -Recommendation for PT    Data Reviewed: Basic Metabolic Panel: Recent Labs  Lab 03/13/20 0256  NA 137  K 3.9  CL 102  CO2 27  GLUCOSE 98  BUN 16  CREATININE 1.12  CALCIUM 9.5   Liver Function Tests: Recent Labs  Lab 03/13/20 0256  AST 18  ALT 13  ALKPHOS 66  BILITOT 0.8  PROT 6.6  ALBUMIN 3.8   No results for input(s): LIPASE, AMYLASE in the last 168 hours. No results for input(s): AMMONIA in the last 168 hours. CBC: Recent Labs  Lab 03/13/20 0256  WBC 14.2*  HGB 12.4*  HCT 39.4  MCV 89.7  PLT 215   Cardiac Enzymes: No results for input(s): CKTOTAL, CKMB, CKMBINDEX, TROPONINI in the last 168 hours. BNP (last 3 results) Recent Labs    02/21/20 1144  BNP 359.3*    ProBNP (last 3 results) No results for input(s): PROBNP in the last 8760 hours.  CBG: No results for input(s): GLUCAP in the last 168 hours.  No results found for this or any previous visit (from the past 240 hour(s)).   Studies: No results found.  Scheduled Meds: . diclofenac Sodium  4 g Topical QID  . DULoxetine  20 mg Oral Daily  . feeding supplement  237 mL Oral BID BM  . metoprolol  tartrate  12.5 mg Oral BID  . mometasone-formoterol  2 puff Inhalation BID  . multivitamin with minerals  1 tablet Oral Daily  . ondansetron  4 mg Oral TID WC & HS  . pantoprazole  40 mg Oral BID  . ramelteon  8 mg Oral QHS  . rivaroxaban  20 mg Oral QHS  . rOPINIRole  2 mg Oral QHS  . rosuvastatin  40 mg Oral Daily  . thiamine  100 mg Oral Daily  . 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 If 7PM-7AM, please contact night-coverage at [email or web address] 03/14/2020, 12:47 PM  LOS: 22 days          .

## 2020-03-15 DIAGNOSIS — T50902A Poisoning by unspecified drugs, medicaments and biological substances, intentional self-harm, initial encounter: Secondary | ICD-10-CM | POA: Diagnosis not present

## 2020-03-15 DIAGNOSIS — F332 Major depressive disorder, recurrent severe without psychotic features: Secondary | ICD-10-CM | POA: Diagnosis not present

## 2020-03-15 DIAGNOSIS — T1491XA Suicide attempt, initial encounter: Secondary | ICD-10-CM | POA: Diagnosis not present

## 2020-03-15 NOTE — Progress Notes (Signed)
[facility] PROGRESS NOTE  David Oneill ZDG:[id number] DOB: Sep 28, 1955 DOA: 02/21/2020 PCP: [name] [facility])    10/21: [place] sitting up in chair after performing ADLs  Status: Inpatient-Remains inpatient appropriate because:Altered mental status, Unsafe d/c plan, IV treatments appropriate due to intensity of illness or inability to take PO and Inpatient level of care appropriate due to severity of illness   Dispo: The patient is from: Home              Anticipated d/c is to: Inpatient Geri Psych unit              Anticipated d/c date is: > 3 days              Patient currently is medically stable to d/c.  [place].  Needs Geripsych placement 2/2 presenting with suicide attempt-no geripsych beds available thru [facility]  Code Status: Full Family Communication: Patient only DVT prophylaxis: Xarelto Vaccination status: Fully vaccinated against Covid as of July 5422  HPI: 64 year old male admitted with suicidal intent and presented with drug overdose with oxycodone and Restoril along with wrist lacerations.  Patient initially admitted by PCCM.  CT head was noted to have a small occipital intraparenchymal hemorrhage and subarachnoid extension.  Psychiatry consulted and recommended inpatient psych.  Patient now awaiting [facility] psych placement.  Subjective: Awakened.  Reported used oral ibuprofen much earlier during the night with the Voltaren gel and slept much better.  Had questions regarding Zanaflex and informed that this is not scheduled and he would need to ask for this medication.  Encouraged to take a Zanaflex with the ibuprofen to help with pain and sleep.  Objective: Vitals:   03/15/20 0525 03/15/20 0824  BP: 115/71 (!) 147/74  Pulse: 66 75  Resp: 17 20  Temp: 98.1 F (36.7 C)   SpO2: 98% 99%    Intake/Output Summary (Last 24 hours) at 03/15/2020 1230 Last data filed at 03/15/2020 0830 Gross per 24 hour  Intake 720 ml  Output 2250 ml  Net -1530 ml    Filed Weights   03/13/20 0216 03/14/20 0500 03/15/20 0531  Weight: 103 kg 104.5 kg 103.9 kg    Exam: Constitutional: NAD, awakened, comfortable Respiratory: clear to auscultation bilaterally, no wheezing, no crackles. Normal respiratory effort. No accessory muscle use.  Room air Cardiovascular: Irregular rate and rhythm underlying atrial fibrillation, no murmurs / rubs / gallops. No extremity edema.  Abdomen: no tenderness, no masses palpated. No hepatosplenomegaly. Bowel sounds positive.  Musculoskeletal: no clubbing / cyanosis. No joint deformity upper and lower extremities. Good ROM, no contractures. Normal muscle tone.  Skin: no rashes, lesions, ulcers. No induration Neurologic: CN 2-12 grossly intact. Sensation intact, DTR normal. Strength 5/5 x all 4 extremities.  Psychiatric: Alert and oriented x 3.  Flat affect.   Assessment/Plan: Non-aneurysmal small left occipital intraparenchymal hemorrhage with subarachnoid extension/ Acute toxic encephalopathy  -Secondary to drug overdose with oxycodone and Restoril along with trauma in the light of anticoagulant (Xarelto) use -OP geriatric psych documentation patient has a history of polypharmacy and alcohol abuse as well with last alcohol use about 4 years ago -Xarelto reversed with Kcentra in the ED;Neurosurgery consulted;resumedxarelto 10/11 follow-up CT head unremarkable  Reported history of Wernicke's encephalopathy/history of memory loss/chronic insomnia -Has also been followed by [name] psych at [facility]: Differential during evaluation September 2021 included Alzheimer's dementia, frontotemporal alcoholic dementia, pseudodementia with depression or possible vascular etiology -September 2021 he was started on mirtazapine 15  mg HS by [name] psych-given symptoms complicated by ongoing depression and insomnia -MRI of the brain was ordered at that September visit -he was also referred to Dr. Paula Libra with geriatric  psychiatry-it does not appear that MRI was completed nor has patient followed up with outpatient Geri psych  -TSH, RPR, and HIV negative. Iron low and will be replaced.  Suicide attempt - bilateral wrist laceration and intentional drug overdose -IP psych evaluated and recommended inpatient geri-psych placement-unfortunately [facility] does not have this type of bed available in our behavioral health inpatient unit -Continue suicide precautions with safety sitter at bedside-psychiatry documents high risk for suicide since lived alone prior to admission -LCSW request routine psych follow-up every 72 hours; 10/25 repeat electrolytes and CBC unremarkable.  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  Recommend continuing Lexapro (had previously been on Cymbalta but had self discontinued this medication prior to September visit with [facility] psychiatrist and was subsequently started on Remeron).  -During this hospitalization Remeron and melatonin were discontinued and Rozerem was started at bedtime. (OP documentation re: chronic insomnia) -Wrist lacerations repaired on 10/5 sutures removed on 10/25 with incisions unremarkable in appearance -10/27 patient requesting Cymbalta be renewed because previously prescribed to treat RLS along with Requip.  Patient has no recollection of telling providers he had voluntarily stopped this medication including conversations with providers at [facility] and at this facility since arrival. -10/27 DC'd Lexapro in favor of low-dose Cymbalta which will be slowly titrated up to previous dose of 60 mg/day  Insomnia/back pain -Continue Voltaren gel and if ineffective after 1 hour has ibuprofen q HS prn available  -Encouraged to use Zanaflex at bedtime as well  RLS -Resume  preadmission Cymbalta but at much lower dose and slowly titrate up every 3 to 5 days to in dose of 60 mg daily -Requip 2 mg q. HS  Protein calorie malnutrition Nutrition Status: Nutrition Problem: Inadequate oral intake Etiology: decreased appetite Signs/Symptoms: meal completion < 50% Interventions: Ensure Enlive (each supplement provides 350kcal and 20 grams of protein), MVI  Iron deficiency anemia -Likely related to poor nutrition in context of recurrent nausea since June -Iron 38 and was given 1000 mg of IV iron on 52/84  Chronic diastolic heart failure/pulmonary hypertension/TR -Currently appears to be euvolemic and compensated -Continue to monitor I's and O's, daily weights -Has been followed by outpatient cardiology for this as well as atrial fibrillation -Echocardiogram 12/31/19 from [facility] instead preserved EF 55 to 60% with mild aortic regurgitation and mild to moderate TR with RVSP 42 mmHg and mild pulmonary hypertension, aortic sinus moderately dilated at 4.9 cm and a moderately dilated ascending aorta 5.0 cm  Chronic atrial fibrillation/flutter -Continue Xarelto -Rate controlled on metoprolol -Followed by cardiology and EP at [facility] clinics  Recent Massive PE -Hospitalized in July of this year at [facility] non-STEMI characterized as demand ischemia at same time -Continue Xarelto  CLL -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 Joan Mayans at [facility]; patient remains in observational status without any active treatments  Obesity/OSA -BMI 31.33  -Patient will need to follow-up with his PCP on discharge and discuss lifestyle modifications -It appears patient underwent sleep study in the past few months but apparently [employer] denied CPAP  Chronic nausea -In review of outside records patient has been having these  symptoms since June 2021 -Tolerating regular diet after SLP  evaluation including MBSS demonstrated no evidence of dysphagia or other swallowing abnormalities -EGD this admission with esophageal plaques pathology negative for Candida. Mild duodenal bulb erythema and mildly congested mucosa in the second end of the duodenum pathology demonstrating acute inflammation.  -Continue PPI.  Discontinued famotidine and Reglan.   -Continue Zofran (on scheduled as well as prn for breakthrough). -Of note patient has been chronically using hemp flower and CBD oil as well as CBD vaping to assist with nausea symptoms-states has not used marijuana after returning to [place] from [place] neck pain -Followed by Dr. Celedonio Miyamoto at the clinics at [facility] with mild to moderate degenerative disc disease with mild to severe facet joint arthrosis contributing to multilevel mild to severe neural foraminal stenosis and mild to moderate acquired spinal canal stenosis -Recommendation for PT    Data Reviewed: Basic Metabolic Panel: Recent Labs  Lab 03/13/20 0256  NA 137  K 3.9  CL 102  CO2 27  GLUCOSE 98  BUN 16  CREATININE 1.12  CALCIUM 9.5   Liver Function Tests: Recent Labs  Lab 03/13/20 0256  AST 18  ALT 13  ALKPHOS 66  BILITOT 0.8  PROT 6.6  ALBUMIN 3.8   No results for input(s): LIPASE, AMYLASE in the last 168 hours. No results for input(s): AMMONIA in the last 168 hours. CBC: Recent Labs  Lab 03/13/20 0256  WBC 14.2*  HGB 12.4*  HCT 39.4  MCV 89.7  PLT 215   Cardiac Enzymes: No results for input(s): CKTOTAL, CKMB, CKMBINDEX, TROPONINI in the last 168 hours. BNP (last 3 results) Recent Labs    02/21/20 1144  BNP 359.3*    ProBNP (last 3 results) No results for input(s): PROBNP in the last 8760 hours.  CBG: No results for input(s): GLUCAP in the last 168 hours.  No results found for this or any previous visit (from the past 240 hour(s)).   Studies: No results  found.  Scheduled Meds: . diclofenac Sodium  4 g Topical QID  . DULoxetine  20 mg Oral Daily  . feeding supplement  237 mL Oral BID BM  . metoprolol tartrate  12.5 mg Oral BID  . mometasone-formoterol  2 puff Inhalation BID  . multivitamin with minerals  1 tablet Oral Daily  . ondansetron  4 mg Oral TID WC & HS  . pantoprazole  40 mg Oral BID  . ramelteon  8 mg Oral QHS  . rivaroxaban  20 mg Oral QHS  . rOPINIRole  2 mg Oral QHS  . rosuvastatin  40 mg Oral Daily  . thiamine  100 mg Oral Daily  . 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 If 7PM-7AM, please contact night-coverage at [email or web address] 03/15/2020, 12:30 PM  LOS: 23 days          .

## 2020-03-15 NOTE — Progress Notes (Signed)
CSW reached out to [facility], confirmed they didn't receive original referral, resent fax with updated clinicals, confirmed bed availability. One bed left for geripsych. CSW will follow up with [facility] this afternoon.

## 2020-03-15 NOTE — Social Work (Signed)
CSW reached back out to [place] to check on status of referral, had to leave a vm for intake department. CSW will reach out tomorrow.

## 2020-03-16 DIAGNOSIS — T1491XA Suicide attempt, initial encounter: Secondary | ICD-10-CM | POA: Diagnosis not present

## 2020-03-16 DIAGNOSIS — T50902A Poisoning by unspecified drugs, medicaments and biological substances, intentional self-harm, initial encounter: Secondary | ICD-10-CM | POA: Diagnosis not present

## 2020-03-16 DIAGNOSIS — F332 Major depressive disorder, recurrent severe without psychotic features: Secondary | ICD-10-CM | POA: Diagnosis not present

## 2020-03-16 NOTE — Progress Notes (Signed)
[facility] PROGRESS NOTE  David Oneill GGE:[id number] DOB: [birth year] DOA: 02/21/2020 PCP: [name] [facility])    10/21: David Oneill sitting up in chair after performing ADLs  Status: Inpatient-Remains inpatient appropriate because:Altered mental status, Unsafe d/c plan, IV treatments appropriate due to intensity of illness or inability to take PO and Inpatient level of care appropriate due to severity of illness   Dispo: The patient is from: Home              Anticipated d/c is to: Inpatient Geri Psych unit              Anticipated d/c date is: > 3 days              Patient currently is medically stable to d/c.  [place].  Needs Geripsych placement 2/2 presenting with suicide attempt-no geripsych beds available thru [facility]  Code Status: Full Family Communication: Patient only DVT prophylaxis: Xarelto Vaccination status: Fully vaccinated against Covid as of July 5749  HPI: 64 year old male admitted with suicidal intent and presented with drug overdose with oxycodone and Restoril along with wrist lacerations.  Patient initially admitted by PCCM.  CT head was noted to have a small occipital intraparenchymal hemorrhage and subarachnoid extension.  Psychiatry consulted and recommended inpatient psych.  Patient now awaiting [facility] psych placement.  Subjective: Awake.  States improvement in back pain inability to sleep using Voltaren gel, nocturnal ibuprofen and muscle relaxer.  Objective: Vitals:   03/16/20 0819 03/16/20 1220  BP:  135/70  Pulse: 74 67  Resp: 18 18  Temp:  98.1 F (36.7 C)  SpO2: 98% 99%    Intake/Output Summary (Last 24 hours) at 03/16/2020 1224 Last data filed at 03/15/2020 1446 Gross per 24 hour  Intake 240 ml  Output --  Net 240 ml   Filed Weights   03/14/20 0500 03/15/20 0531 03/16/20 0407  Weight: 104.5 kg 103.9 kg 104 kg    Exam: Constitutional: NAD, awakened, comfortable Respiratory: clear to auscultation bilaterally, no wheezing,  no crackles. Normal respiratory effort. No accessory muscle use.  Room air Cardiovascular: Irregular rate and rhythm underlying atrial fibrillation, no murmurs / rubs / gallops. No extremity edema.  Abdomen: no tenderness, no masses palpated. No hepatosplenomegaly. Bowel sounds positive.  Musculoskeletal: no clubbing / cyanosis. No joint deformity upper and lower extremities. Good ROM, no contractures. Normal muscle tone.  Skin: no rashes, lesions, ulcers. No induration Neurologic: CN 2-12 grossly intact. Sensation intact, DTR normal. Strength 5/5 x all 4 extremities.  Psychiatric: Alert and oriented x 3.  Flat affect.   Assessment/Plan: Non-aneurysmal small left occipital intraparenchymal hemorrhage with subarachnoid extension/ Acute toxic encephalopathy  -Secondary to drug overdose with oxycodone and Restoril along with trauma in the light of anticoagulant (Xarelto) use -OP geriatric psych documentation patient has a history of polypharmacy and alcohol abuse as well with last alcohol use about 4 years ago -Xarelto reversed with Kcentra in the ED;Neurosurgery consulted;resumedxarelto 10/11 follow-up CT head unremarkable  Reported history of Wernicke's encephalopathy/history of memory loss/chronic insomnia -Has also been followed by [name] psych at [facility]: Differential during evaluation September 2021 included Alzheimer's dementia, frontotemporal alcoholic dementia, pseudodementia with depression or possible vascular etiology -September 2021 he was started on mirtazapine 15 mg HS by [name] psych-given symptoms complicated by ongoing depression and insomnia -MRI of the brain was ordered at that September visit -he was also referred to Dr. Paula Libra with geriatric psychiatry-it does not appear that MRI was completed  nor has patient followed up with outpatient Geri psych  -TSH, RPR, and HIV negative. Iron was low and was replaced IV.  Suicide attempt - bilateral wrist  laceration and intentional drug overdose -IP psych evaluated and recommended inpatient geri-psych placement-unfortunately [place] does not have this type of bed available in our behavioral health inpatient unit -Continue suicide precautions with safety sitter at bedside-psychiatry documents high risk for suicide since lived alone prior to admission -LCSW request routine psych follow-up every 72 hours per request of outside facilities.  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  Recommend continuing Lexapro (had previously been on Cymbalta but had self discontinued this medication prior to September visit with [facility] psychiatrist and was subsequently started on Remeron).  -During this hospitalization Remeron and melatonin were discontinued and Rozerem was started at bedtime. (OP documentation re: chronic insomnia) -Wrist lacerations repaired on 10/5 sutures removed on 10/25 with incisions unremarkable in appearance -10/27 patient requesting Cymbalta be renewed because previously prescribed to treat RLS along with Requip.  Patient has no recollection of telling providers he had voluntarily stopped this medication including conversations with providers at [facility] and at this facility since arrival. -10/27 DC'd Lexapro in favor of low-dose Cymbalta which will be slowly titrated up to previous dose of 60 mg/day  Insomnia/back pain -Continue Voltaren gel and if ineffective after 1 hour has ibuprofen q HS prn available  -Encouraged to use Zanaflex at bedtime as well  RLS -Resume preadmission Cymbalta but at much lower dose and slowly titrate up every 3 to 5 days to in dose of 60 mg daily -Requip 2 mg q. HS  Protein calorie malnutrition Nutrition Status: Nutrition Problem: Inadequate oral intake Etiology: decreased  appetite Signs/Symptoms: meal completion < 50% Interventions: Ensure Enlive (each supplement provides 350kcal and 20 grams of protein), MVI  Iron deficiency anemia -Likely related to poor nutrition in context of recurrent nausea since June -Iron 38 and was given 1000 mg of IV iron on 03/47  Chronic diastolic Oneill failure/pulmonary hypertension/TR -Currently appears to be euvolemic and compensated -Continue to monitor I's and O's, daily weights -Has been followed by outpatient cardiology for this as well as atrial fibrillation -Echocardiogram 12/31/19 from [facility] instead preserved EF 55 to 60% with mild aortic regurgitation and mild to moderate TR with RVSP 42 mmHg and mild pulmonary hypertension, aortic sinus moderately dilated at 4.9 cm and a moderately dilated ascending aorta 5.0 cm  Chronic atrial fibrillation/flutter -Continue Xarelto -Rate controlled on metoprolol -Followed by cardiology and EP at [facility] clinics  Recent Massive PE -Hospitalized in July of this year at [facility] non-STEMI characterized as demand ischemia at same time -Continue Xarelto  CLL -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 Joan Mayans at [facility]; patient remains in observational status without any active treatments  Obesity/OSA -BMI 31.33  -Patient will need to follow-up with his PCP on discharge and discuss lifestyle modifications -It appears patient underwent sleep study in the past few months but apparently [employer] denied CPAP  Chronic nausea -In review of outside records patient has been having these symptoms since June 2021 -Tolerating regular diet after SLP evaluation including MBSS demonstrated no evidence of dysphagia or other swallowing abnormalities -EGD this admission with esophageal plaques pathology negative for Candida. Mild duodenal bulb erythema and mildly congested mucosa in the second  end of the duodenum pathology demonstrating acute inflammation.  -Continue PPI.  Discontinued famotidine and Reglan.   -Continue Zofran (on scheduled as well as prn for breakthrough). -Of note patient has been chronically using hemp flower and CBD oil as well as CBD vaping to assist with nausea symptoms-states has not used marijuana after returning to [place] from [place] neck pain -Followed by Dr. Celedonio Miyamoto at the clinics at [facility] with mild to moderate degenerative disc disease with mild to severe facet joint arthrosis contributing to multilevel mild to severe neural foraminal stenosis and mild to moderate acquired spinal canal stenosis -Recommendation for PT    Data Reviewed: Basic Metabolic Panel: Recent Labs  Lab 03/13/20 0256  NA 137  K 3.9  CL 102  CO2 27  GLUCOSE 98  BUN 16  CREATININE 1.12  CALCIUM 9.5   Liver Function Tests: Recent Labs  Lab 03/13/20 0256  AST 18  ALT 13  ALKPHOS 66  BILITOT 0.8  PROT 6.6  ALBUMIN 3.8   No results for input(s): LIPASE, AMYLASE in the last 168 hours. No results for input(s): AMMONIA in the last 168 hours. CBC: Recent Labs  Lab 03/13/20 0256  WBC 14.2*  HGB 12.4*  HCT 39.4  MCV 89.7  PLT 215   Cardiac Enzymes: No results for input(s): CKTOTAL, CKMB, CKMBINDEX, TROPONINI in the last 168 hours. BNP (last 3 results) Recent Labs    02/21/20 1144  BNP 359.3*    ProBNP (last 3 results) No results for input(s): PROBNP in the last 8760 hours.  CBG: No results for input(s): GLUCAP in the last 168 hours.  No results found for this or any previous visit (from the past 240 hour(s)).   Studies: No results found.  Scheduled Meds: . diclofenac Sodium  4 g Topical QID  . DULoxetine  20 mg Oral Daily  . feeding supplement  237 mL Oral BID BM  . metoprolol tartrate  12.5 mg Oral BID  . mometasone-formoterol  2 puff Inhalation BID  . multivitamin with  minerals  1 tablet Oral Daily  . ondansetron  4 mg Oral TID WC & HS  . pantoprazole  40 mg Oral BID  . ramelteon  8 mg Oral QHS  . rivaroxaban  20 mg Oral QHS  . rOPINIRole  2 mg Oral QHS  . rosuvastatin  40 mg Oral Daily  . thiamine  100 mg Oral Daily  . 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 If 7PM-7AM, please contact night-coverage at [email or web address] 03/16/2020, 12:24 PM  LOS: 24 days          .

## 2020-03-17 DIAGNOSIS — I629 Nontraumatic intracranial hemorrhage, unspecified: Secondary | ICD-10-CM | POA: Diagnosis not present

## 2020-03-17 MED ORDER — DULOXETINE HCL 20 MG PO CPEP
40.0000 mg | ORAL_CAPSULE | Freq: Every day | ORAL | Status: DC
  Administered 2020-03-18 – 2020-03-23 (×6): 40 mg via ORAL
  Filled 2020-03-17 (×7): qty 2

## 2020-03-17 NOTE — Progress Notes (Signed)
No nausea overnight.

## 2020-03-17 NOTE — Progress Notes (Signed)
[facility] PROGRESS NOTE  David Oneill JXB:[id number] DOB: 18-Mar-1956 DOA: 02/21/2020 PCP: Laqueta Linden [facility])   Status: Inpatient-   Dispo: The patient is from: Home              Anticipated d/c is to: Inpatient Geri Psych unit              Anticipated d/c date is: > 3 days              Patient currently is medically stable to d/c.  [name].  Needs Geripsych placement 2/2 presenting with suicide attempt-no geripsych beds available thru [facility]  Code Status: Full Family Communication: Patient only DVT prophylaxis: Xarelto Vaccination status: Fully vaccinated against Covid as of July 4486  HPI: 64 year old male admitted with suicidal intent and presented with drug overdose with oxycodone and Restoril along with wrist lacerations.  Patient initially admitted by PCCM.  CT head was noted to have a small occipital intraparenchymal hemorrhage and subarachnoid extension.  Psychiatry consulted and recommended inpatient psych.  Patient now awaiting [facility] psych placement.  Subjective: Doing fair no recurrent issues other than poor sleep and is asking about whether something can be given for this  Note however that he is already on Rozerem 8 mg at bedtime  Objective: Vitals:   03/17/20 0522 03/17/20 1214  BP: (!) 142/76 119/67  Pulse: 60 64  Resp:  18  Temp: 98 F (36.7 C) 99 F (37.2 C)  SpO2: 100% 98%    Intake/Output Summary (Last 24 hours) at 03/17/2020 1546 Last data filed at 03/17/2020 1209 Gross per 24 hour  Intake 1320 ml  Output 1300 ml  Net 20 ml   Filed Weights   03/15/20 0531 03/16/20 0407 03/17/20 0519  Weight: 103.9 kg 104 kg 104.7 kg    Exam:  Awake pleasant ambulatory in room no distress EOMI NCAT no focal deficit Chest clear no added sound Abdomen soft obese nontender no rebound ROM intact to major muscle groups in major joints Skin soft supple   Assessment/Plan: Non-aneurysmal small left occipital intraparenchymal hemorrhage with  subarachnoid extension/ Acute toxic encephalopathy  -Secondary to drug overdose with oxycodone and Restoril along with trauma in the light of anticoagulant (Xarelto) use -Prior polypharmacy and alcohol abuse as well with last alcohol use about 4 years ago -Xarelto reversed with Kcentra in the ED;Neurosurgery consulted;resumedxarelto 10/11 follow-up CT head unremarkable  Reported history of Wernicke's encephalopathy/history of memory loss/chronic insomnia -Has also been followed by [name] psych at [facility]: Differential during evaluation September 2021 included Alzheimer's dementia, frontotemporal alcoholic dementia, pseudodementia with depression or possible vascular etiology -September 2021 he was started on mirtazapine 15 mg HS by [name] psych-given symptoms complicated by ongoing depression and insomnia -MRI of the brain was ordered at that September visit -he was also referred to Dr. Paula Libra with geriatric psychiatry-he needs outpatient MRI and outpatient follow-up with Geri psych -TSH, RPR, and HIV negative. Iron was low and was replaced IV.  Suicide attempt - bilateral wrist laceration and intentional drug overdose -IP psych evaluated and recommended inpatient geri-psych placement-unfortunately [place] does not have this type of bed available in our behavioral health inpatient unit -Continue suicide precautions with safety sitter at bedside-psychiatry documents high risk for suicide since lived alone prior to admission -LCSW request routine psych follow-up every 72 hours per request of outside facilities.  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  Recommend continuing Lexapro (had previously been on Cymbalta but had self discontinued this medication prior to September visit with [facility] psychiatrist and  was subsequently started on Remeron).  - Remeron and melatonin were discontinued and Rozerem was started at bedtime. (OP documentation re: chronic insomnia) -Wrist lacerations repaired on 10/5 sutures removed on 10/25 with incisions unremarkable in appearance -10/27 patient requesting Cymbalta be renewed because previously prescribed to treat RLS along with Requip.  Patient has no recollection of telling providers he had voluntarily stopped this medication including conversations with providers at [facility] and at this facility since arrival. -10/27 DC'd Lexapro in favor of low-dose Cymbalta currently at increased from 20-40 mg but will increase to prior 60 mg/day  Insomnia/back pain -Continue Voltaren gel and if ineffective after 1 hour has ibuprofen q HS prn available  -Encouraged to use Zanaflex at bedtime as well  RLS -Resume preadmission Cymbalta but at much lower dose and slowly titrate up every 3 to 5 days to in dose of 60 mg daily -Requip 2 mg q. HS  Protein calorie malnutrition Nutrition Status: Nutrition Problem: Inadequate oral intake Etiology: decreased appetite Signs/Symptoms: meal completion < 50% Interventions: Ensure Enlive (each supplement provides 350kcal and 20 grams of protein), MVI  Iron deficiency anemia -Likely related to poor nutrition in context of recurrent nausea since June -Iron 38 and was given 1000 mg of IV iron on 17/40  Chronic diastolic heart failure/pulmonary hypertension/TR -Currently appears to be euvolemic and compensated -Continue to monitor I's and O's, daily weights -Has been followed by outpatient cardiology for this as well as atrial fibrillation -Echocardiogram 12/31/19 from [facility] instead preserved EF 55 to 60% with mild aortic regurgitation and mild to moderate TR with RVSP 42 mmHg and mild pulmonary hypertension, aortic sinus moderately dilated at 4.9 cm and a moderately dilated ascending aorta 5.0 cm  Chronic atrial  fibrillation/flutter -Continue Xarelto -Rate controlled on metoprolol -Followed by cardiology and EP at [facility] clinics  Recent Massive PE -Hospitalized in July of this year at [facility] non-STEMI characterized as demand ischemia at same time -Continue Xarelto  CLL -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 Joan Mayans at [facility]; patient remains in observational status without any active treatments  Obesity/OSA -BMI 31.33  -Patient will need to follow-up with his PCP on discharge and discuss lifestyle modifications -It appears patient underwent sleep study in the past few months but apparently [employer] denied CPAP  Chronic nausea -In review of outside records patient has been having these symptoms since June 2021 -Tolerating regular diet after SLP evaluation including MBSS demonstrated no evidence of dysphagia or other swallowing abnormalities -EGD this admission with esophageal plaques pathology negative for Candida. Mild duodenal bulb erythema and mildly congested mucosa in the second end of the duodenum pathology demonstrating acute inflammation.  -Continue PPI.  Discontinued famotidine and Reglan.   -Continue Zofran (on scheduled as well as prn for breakthrough). -Of note patient has been chronically using hemp flower and CBD oil as well as CBD vaping to assist with nausea symptoms-states has not used marijuana after returning to [place] from [place] neck pain -Followed by Dr. Celedonio Miyamoto at the clinics at [facility] with mild to moderate degenerative disc disease with mild to severe facet joint arthrosis contributing to multilevel mild to severe neural foraminal stenosis and mild to moderate acquired  spinal canal stenosis -Recommendation for PT    Data Reviewed: Basic Metabolic Panel: Recent Labs  Lab 03/13/20 0256  NA  137  K 3.9  CL 102  CO2 27  GLUCOSE 98  BUN 16  CREATININE 1.12  CALCIUM 9.5   Liver Function Tests: Recent Labs  Lab 03/13/20 0256  AST 18  ALT 13  ALKPHOS 66  BILITOT 0.8  PROT 6.6  ALBUMIN 3.8   No results for input(s): LIPASE, AMYLASE in the last 168 hours. No results for input(s): AMMONIA in the last 168 hours. CBC: Recent Labs  Lab 03/13/20 0256  WBC 14.2*  HGB 12.4*  HCT 39.4  MCV 89.7  PLT 215   Cardiac Enzymes: No results for input(s): CKTOTAL, CKMB, CKMBINDEX, TROPONINI in the last 168 hours. BNP (last 3 results) Recent Labs    02/21/20 1144  BNP 359.3*    ProBNP (last 3 results) No results for input(s): PROBNP in the last 8760 hours.  CBG: No results for input(s): GLUCAP in the last 168 hours.  No results found for this or any previous visit (from the past 240 hour(s)).   Studies: No results found.  Scheduled Meds: . diclofenac Sodium  4 g Topical QID  . DULoxetine  20 mg Oral Daily  . feeding supplement  237 mL Oral BID BM  . metoprolol tartrate  12.5 mg Oral BID  . mometasone-formoterol  2 puff Inhalation BID  . multivitamin with minerals  1 tablet Oral Daily  . ondansetron  4 mg Oral TID WC & HS  . pantoprazole  40 mg Oral BID  . ramelteon  8 mg Oral QHS  . rivaroxaban  20 mg Oral QHS  . rOPINIRole  2 mg Oral QHS  . rosuvastatin  40 mg Oral Daily  . thiamine  100 mg Oral Daily  . 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

## 2020-03-17 NOTE — Plan of Care (Signed)
  Problem: Nutrition: Goal: Adequate nutrition will be maintained Outcome: Progressing   Problem: Education: Goal: Knowledge of General Education information will improve Description: Including pain rating scale, medication(s)/side effects and non-pharmacologic comfort measures Outcome: Adequate for Discharge   Problem: Health Behavior/Discharge Planning: Goal: Ability to manage health-related needs will improve Outcome: Adequate for Discharge   Problem: Clinical Measurements: Goal: Ability to maintain clinical measurements within normal limits will improve Outcome: Adequate for Discharge Goal: Will remain free from infection Outcome: Adequate for Discharge Goal: Diagnostic test results will improve Outcome: Adequate for Discharge Goal: Respiratory complications will improve Outcome: Adequate for Discharge Goal: Cardiovascular complication will be avoided Outcome: Adequate for Discharge   Problem: Coping: Goal: Level of anxiety will decrease Outcome: Adequate for Discharge   Problem: Elimination: Goal: Will not experience complications related to urinary retention Outcome: Adequate for Discharge   Problem: Safety: Goal: Ability to remain free from injury will improve Outcome: Adequate for Discharge   Problem: Skin Integrity: Goal: Risk for impaired skin integrity will decrease Outcome: Adequate for Discharge

## 2020-03-18 LAB — COMPREHENSIVE METABOLIC PANEL
ALT: 14 U/L (ref 0–44)
AST: 19 U/L (ref 15–41)
Albumin: 3.7 g/dL (ref 3.5–5.0)
Alkaline Phosphatase: 54 U/L (ref 38–126)
Anion gap: 7 (ref 5–15)
BUN: 20 mg/dL (ref 8–23)
CO2: 28 mmol/L (ref 22–32)
Calcium: 9.3 mg/dL (ref 8.9–10.3)
Chloride: 103 mmol/L (ref 98–111)
Creatinine, Ser: 1.17 mg/dL (ref 0.61–1.24)
GFR, Estimated: >60 mL/min (ref >60)
Glucose, Bld: 102 mg/dL — ABNORMAL HIGH (ref 70–99)
Potassium: 4 mmol/L (ref 3.5–5.1)
Sodium: 138 mmol/L (ref 135–145)
Total Bilirubin: 1 mg/dL (ref 0.3–1.2)
Total Protein: 6.6 g/dL (ref 6.5–8.1)

## 2020-03-18 LAB — CBC WITH DIFFERENTIAL/PLATELET
Abs Immature Granulocytes: 0 10*3/uL (ref 0.00–0.07)
Basophils Absolute: 0.1 10*3/uL (ref 0.0–0.1)
Basophils Relative: 1 %
Eosinophils Absolute: 0.5 10*3/uL (ref 0.0–0.5)
Eosinophils Relative: 4 %
HCT: 38.8 % — ABNORMAL LOW (ref 39.0–52.0)
Hemoglobin: 12 g/dL — ABNORMAL LOW (ref 13.0–17.0)
Lymphocytes Relative: 31 %
Lymphs Abs: 3.5 10*3/uL (ref 0.7–4.0)
MCH: 28.3 pg (ref 26.0–34.0)
MCHC: 30.9 g/dL (ref 30.0–36.0)
MCV: 91.5 fL (ref 80.0–100.0)
Monocytes Absolute: 1 10*3/uL (ref 0.1–1.0)
Monocytes Relative: 9 %
Neutro Abs: 6.3 10*3/uL (ref 1.7–7.7)
Neutrophils Relative %: 55 %
Platelets: 179 10*3/uL (ref 150–400)
RBC: 4.24 MIL/uL (ref 4.22–5.81)
RDW: 15.6 % — ABNORMAL HIGH (ref 11.5–15.5)
WBC: 11.4 10*3/uL — ABNORMAL HIGH (ref 4.0–10.5)
nRBC: 0 % (ref 0.0–0.2)
nRBC: 0 /100 WBC

## 2020-03-18 NOTE — Progress Notes (Signed)
Patient asleep today  Didn't disturb continue POC await placement  Verneita Griffes, MD [facility] 2:47 PM

## 2020-03-19 DIAGNOSIS — K625 Hemorrhage of anus and rectum: Secondary | ICD-10-CM | POA: Diagnosis not present

## 2020-03-19 LAB — COMPREHENSIVE METABOLIC PANEL
ALT: 15 U/L (ref 0–44)
AST: 17 U/L (ref 15–41)
Albumin: 3.7 g/dL (ref 3.5–5.0)
Alkaline Phosphatase: 59 U/L (ref 38–126)
Anion gap: 9 (ref 5–15)
BUN: 21 mg/dL (ref 8–23)
CO2: 27 mmol/L (ref 22–32)
Calcium: 9.3 mg/dL (ref 8.9–10.3)
Chloride: 103 mmol/L (ref 98–111)
Creatinine, Ser: 1.35 mg/dL — ABNORMAL HIGH (ref 0.61–1.24)
GFR, Estimated: 58 mL/min — ABNORMAL LOW (ref >60)
Glucose, Bld: 95 mg/dL (ref 70–99)
Potassium: 4.4 mmol/L (ref 3.5–5.1)
Sodium: 139 mmol/L (ref 135–145)
Total Bilirubin: 0.8 mg/dL (ref 0.3–1.2)
Total Protein: 6.1 g/dL — ABNORMAL LOW (ref 6.5–8.1)

## 2020-03-19 LAB — CBC WITH DIFFERENTIAL/PLATELET
Abs Immature Granulocytes: 0.05 10*3/uL (ref 0.00–0.07)
Basophils Absolute: 0.1 10*3/uL (ref 0.0–0.1)
Basophils Relative: 1 %
Eosinophils Absolute: 0.2 10*3/uL (ref 0.0–0.5)
Eosinophils Relative: 2 %
HCT: 38.4 % — ABNORMAL LOW (ref 39.0–52.0)
Hemoglobin: 12.1 g/dL — ABNORMAL LOW (ref 13.0–17.0)
Immature Granulocytes: 0 %
Lymphocytes Relative: 33 %
Lymphs Abs: 4.3 10*3/uL — ABNORMAL HIGH (ref 0.7–4.0)
MCH: 28.1 pg (ref 26.0–34.0)
MCHC: 31.5 g/dL (ref 30.0–36.0)
MCV: 89.3 fL (ref 80.0–100.0)
Monocytes Absolute: 1.8 10*3/uL — ABNORMAL HIGH (ref 0.1–1.0)
Monocytes Relative: 13 %
Neutro Abs: 6.7 10*3/uL (ref 1.7–7.7)
Neutrophils Relative %: 51 %
Platelets: 166 10*3/uL (ref 150–400)
RBC: 4.3 MIL/uL (ref 4.22–5.81)
RDW: 15.2 % (ref 11.5–15.5)
WBC: 13 10*3/uL — ABNORMAL HIGH (ref 4.0–10.5)
nRBC: 0 % (ref 0.0–0.2)

## 2020-03-19 MED ORDER — POLYETHYLENE GLYCOL 3350 17 G PO PACK
17.0000 g | PACK | Freq: Every day | ORAL | Status: DC
  Administered 2020-03-20 – 2020-03-27 (×8): 17 g via ORAL
  Filled 2020-03-19 (×8): qty 1

## 2020-03-19 MED ORDER — DOCUSATE SODIUM 100 MG PO CAPS
100.0000 mg | ORAL_CAPSULE | Freq: Two times a day (BID) | ORAL | Status: DC
  Administered 2020-03-19 – 2020-03-27 (×16): 100 mg via ORAL
  Filled 2020-03-19 (×16): qty 1

## 2020-03-19 NOTE — Progress Notes (Addendum)
[facility] PROGRESS NOTE  David Oneill [id number]:[id number] DOB: 08-Feb-1956 DOA: 02/21/2020 PCP: Laqueta Linden [facility])   Status: Inpatient-   Dispo: The patient is from: Home              Anticipated d/c is to: Inpatient Geri Psych unit              Anticipated d/c date is: > 3 days              Patient currently is medically stable to d/c.  [place].  Needs Geripsych placement 2/2 presenting with suicide attempt-no geripsych beds available thru [facility] or elsewhere  Code Status: Full Family Communication: Patient only DVT prophylaxis: Xarelto Vaccination status: Fully vaccinated against Covid as of July 6074  HPI: 64 year old male admitted with suicidal intent and presented with drug overdose with oxycodone and Restoril along with wrist lacerations.  Patient initially admitted by PCCM.  CT head was noted to have a small occipital intraparenchymal hemorrhage and subarachnoid extension.  Psychiatry consulted and recommended inpatient psych.  Patient now awaiting [facility] psych placement.  Subjective: Safety sitter at bedside.  States that he is doing okay.  Reported 2 episodes of streaks of blood on toilet paper over the last couple of days and also reported some constipation.  Denied rectal pain.  No melena, nausea or vomiting.  Objective: Vitals:   03/19/20 0749 03/19/20 0938  BP: (!) 116/92   Pulse: 68   Resp: 20   Temp: 98.2 F (36.8 C)   SpO2: 100% 99%    Intake/Output Summary (Last 24 hours) at 03/19/2020 1802 Last data filed at 03/19/2020 3536 Gross per 24 hour  Intake 240 ml  Output 3400 ml  Net -3160 ml   Filed Weights   03/18/20 0500 03/19/20 0055 03/19/20 0454  Weight: 104.3 kg 105.1 kg 105.1 kg    Exam:  General exam: Pleasant middle-age male, moderately built and nourished sitting up comfortably in chair without distress. Respiratory system: Clear to auscultation.  No increased work of breathing. Cardiovascular system: S1 and S2 heard, RRR.  No JVD,  murmurs or pedal edema Abdominal exam: Nondistended, soft and nontender.  No organomegaly or masses appreciated.  Normal bowel sounds heard. CNS alert and oriented.  No focal neurological deficits. Psychiatric: Pleasant and appropriate.  No A/V hallucinations or suicidal or homicidal ideations reported.    Assessment/Plan: Non-aneurysmal small left occipital intraparenchymal hemorrhage with subarachnoid extension/ Acute toxic encephalopathy  -Secondary to drug overdose with oxycodone and Restoril along with trauma in the light of anticoagulant (Xarelto) use -Prior polypharmacy and alcohol abuse as well with last alcohol use about 4 years ago -Xarelto reversed with Kcentra in the ED;Neurosurgery consulted;resumedxarelto 10/11 follow-up CT head unremarkable -Acute metabolic encephalopathy has resolved.  Reported history of Wernicke's encephalopathy/history of memory loss/chronic insomnia -Has also been followed by [name] psych at [facility]: Differential during evaluation September 2021 included Alzheimer's dementia, frontotemporal alcoholic dementia, pseudodementia with depression or possible vascular etiology -September 2021 he was started on mirtazapine 15 mg HS by [name] psych-given symptoms complicated by ongoing depression and insomnia -MRI of the brain was ordered at that September visit -he was also referred to Dr. Paula Libra with geriatric psychiatry-he needs outpatient MRI and outpatient follow-up with Geri psych -TSH, RPR, and HIV negative. Iron was low and was replaced IV.  Suicide attempt - bilateral wrist laceration and intentional drug overdose -IP psych evaluated and recommended inpatient geri-psych placement-unfortunately [place] does not have this  type of bed available in our behavioral health inpatient unit -Continue suicide precautions with safety sitter at bedside-psychiatry documents high risk for suicide since lived alone prior to admission -LCSW  request routine psych follow-up every 72 hours per request of outside facilities.  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  Recommend continuing Lexapro (had previously been on Cymbalta but had self discontinued this medication prior to September visit with [facility] psychiatrist and was subsequently started on Remeron).  - Remeron and melatonin were discontinued and Rozerem was started at bedtime. (OP documentation re: chronic insomnia) -Wrist lacerations repaired on 10/5 sutures removed on 10/25 with incisions unremarkable in appearance -10/27 patient requesting Cymbalta be renewed because previously prescribed to treat RLS along with Requip.  Patient has no recollection of telling providers he had voluntarily stopped this medication including conversations with providers at [facility] and at this facility since arrival. -10/27 DC'd Lexapro in favor of low-dose Cymbalta currently at increased from 20-40 mg but will increase to prior 60 mg/day -Ongoing difficulty in getting him to an inpatient Geri psych unit.  Insomnia/back pain -Continue Voltaren gel and if ineffective after 1 hour has ibuprofen q HS prn available  -Encouraged to use Zanaflex at bedtime as well No pain reported.  RLS -Resume preadmission Cymbalta but at much lower dose and slowly titrate up every 3 to 5 days to in dose of 60 mg daily -Requip 2 mg q. HS  Protein calorie malnutrition Nutrition Status: Nutrition Problem: Inadequate oral intake Etiology: decreased appetite Signs/Symptoms: meal completion < 50% Interventions: Ensure Enlive (each supplement provides 350kcal and 20 grams of protein), MVI  Iron deficiency anemia -Likely related to poor nutrition in context of recurrent nausea since June -Iron 38 and was given 1000 mg of IV iron on 10/21  Chronic  diastolic heart failure/pulmonary hypertension/TR -Currently appears to be euvolemic and compensated -Continue to monitor I's and O's, daily weights -Has been followed by outpatient cardiology for this as well as atrial fibrillation -Echocardiogram 12/31/19 from [facility] instead preserved EF 55 to 60% with mild aortic regurgitation and mild to moderate TR with RVSP 42 mmHg and mild pulmonary hypertension, aortic sinus moderately dilated at 4.9 cm and a moderately dilated ascending aorta 5.0 cm  Chronic atrial fibrillation/flutter -Continue Xarelto -Rate controlled on metoprolol -Followed by cardiology and EP at [facility] clinics -Reported very minimal rectal bleeding which may be related to constipation/large-volume BM with associated?  Hemorrhoids or fissures.  Aggressive bowel regimen.  Monitor closely and consider further measures if he has increased bleeding.  Recent Massive PE -Hospitalized in July of this year at [facility] non-STEMI characterized as demand ischemia at same time -Continue Xarelto.  Please see discussion above regarding rectal bleeding.  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 Joan Mayans at [facility]; patient remains in observational status without any active treatments  Obesity/OSA -BMI 31.33  -Patient will need to follow-up with his PCP on discharge and discuss lifestyle modifications -It appears patient underwent sleep study in the past few months but apparently [employer] denied CPAP  Chronic nausea -In review of outside records patient has been having these symptoms since June 2021 -Tolerating regular diet after SLP evaluation including MBSS demonstrated no evidence of dysphagia or other swallowing abnormalities -EGD this admission with esophageal plaques pathology negative for Candida. Mild duodenal bulb  erythema and mildly congested mucosa in the second end of the  duodenum pathology demonstrating acute inflammation.  -Continue PPI.  Discontinued famotidine and Reglan.   -Continue Zofran (on scheduled as well as prn for breakthrough). -Of note patient has been chronically using hemp flower and CBD oil as well as CBD vaping to assist with nausea symptoms-states has not used marijuana after returning to [place] from [place] neck pain -Followed by Dr. Celedonio Miyamoto at the clinics at [place] [facility] with mild to moderate degenerative disc disease with mild to severe facet joint arthrosis contributing to multilevel mild to severe neural foraminal stenosis and mild to moderate acquired spinal canal stenosis -Recommendation for PT  Acute kidney injury Creatinine has abruptly gone from 1.17-1.35.?  Related to ibuprofen-discontinued.  Avoid nephrotoxic's.  Follow BMP in a.m.   Data Reviewed: Basic Metabolic Panel: Recent Labs  Lab 03/13/20 0256 03/18/20 0915 03/19/20 0513  NA 137 138 139  K 3.9 4.0 4.4  CL 102 103 103  CO2 27 28 27   GLUCOSE 98 102* 95  BUN 16 20 21   CREATININE 1.12 1.17 1.35*  CALCIUM 9.5 9.3 9.3   Liver Function Tests: Recent Labs  Lab 03/13/20 0256 03/18/20 0915 03/19/20 0513  AST 18 19 17   ALT 13 14 15   ALKPHOS 66 54 59  BILITOT 0.8 1.0 0.8  PROT 6.6 6.6 6.1*  ALBUMIN 3.8 3.7 3.7   No results for input(s): LIPASE, AMYLASE in the last 168 hours. No results for input(s): AMMONIA in the last 168 hours. CBC: Recent Labs  Lab 03/13/20 0256 03/18/20 0915 03/19/20 0513  WBC 14.2* 11.4* 13.0*  NEUTROABS  --  6.3 6.7  HGB 12.4* 12.0* 12.1*  HCT 39.4 38.8* 38.4*  MCV 89.7 91.5 89.3  PLT 215 179 166   Cardiac Enzymes: No results for input(s): CKTOTAL, CKMB, CKMBINDEX, TROPONINI in the last 168 hours. BNP (last 3 results) Recent Labs    02/21/20 1144  BNP 359.3*    ProBNP (last 3 results) No results for input(s): PROBNP in the last 8760 hours.  CBG: No  results for input(s): GLUCAP in the last 168 hours.  No results found for this or any previous visit (from the past 240 hour(s)).   Studies: No results found.  Scheduled Meds: . diclofenac Sodium  4 g Topical QID  . DULoxetine  40 mg Oral Daily  . feeding supplement  237 mL Oral BID BM  . metoprolol tartrate  12.5 mg Oral BID  . mometasone-formoterol  2 puff Inhalation BID  . multivitamin with minerals  1 tablet Oral Daily  . ondansetron  4 mg Oral TID WC & HS  . pantoprazole  40 mg Oral BID  . ramelteon  8 mg Oral QHS  . rivaroxaban  20 mg Oral QHS  . rOPINIRole  2 mg Oral QHS  . rosuvastatin  40 mg Oral Daily  . thiamine  100 mg Oral Daily  . 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 [facility]  To contact the attending provider between 7A-7P or the covering provider during after hours 7P-7A, please log into the web site [email or web address] and access using universal [place] password for that web site. If you do not have the password, please call the hospital operator.         [name]

## 2020-03-19 NOTE — TOC Progression Note (Addendum)
Transition of Care [facility]) - Progression Note    Patient Details  Name: David Oneill MRN: [id number] Date of Birth: 1956-04-24  Transition of Care [facility]) CM/SW [place], [place] Phone Number: 03/19/2020, 9:37 AM  Clinical Narrative:    CSW reached out to [facility], declined pt due medical needs. CSW inquired on the exact reasoning and was told that pt had a brain bleed and a PE recently. CSW asked if MD/NP could speak with someone, intake representative stated someone could speak with an RN but the decision stands as it was made by the medical team. CSW will continue to follow and attempt to secure placement for pt.         Expected Discharge Plan and Services                                                 Social Determinants of Health (SDOH) Interventions    Readmission Risk Interventions No flowsheet data found.

## 2020-03-20 LAB — CBC WITH DIFFERENTIAL/PLATELET
Abs Immature Granulocytes: 0.05 10*3/uL (ref 0.00–0.07)
Basophils Absolute: 0.1 10*3/uL (ref 0.0–0.1)
Basophils Relative: 1 %
Eosinophils Absolute: 0.2 10*3/uL (ref 0.0–0.5)
Eosinophils Relative: 1 %
HCT: 38.3 % — ABNORMAL LOW (ref 39.0–52.0)
Hemoglobin: 12.1 g/dL — ABNORMAL LOW (ref 13.0–17.0)
Immature Granulocytes: 0 %
Lymphocytes Relative: 31 %
Lymphs Abs: 4.1 10*3/uL — ABNORMAL HIGH (ref 0.7–4.0)
MCH: 28.3 pg (ref 26.0–34.0)
MCHC: 31.6 g/dL (ref 30.0–36.0)
MCV: 89.7 fL (ref 80.0–100.0)
Monocytes Absolute: 1.5 10*3/uL — ABNORMAL HIGH (ref 0.1–1.0)
Monocytes Relative: 12 %
Neutro Abs: 7.4 10*3/uL (ref 1.7–7.7)
Neutrophils Relative %: 55 %
Platelets: 161 10*3/uL (ref 150–400)
RBC: 4.27 MIL/uL (ref 4.22–5.81)
RDW: 15.3 % (ref 11.5–15.5)
WBC: 13.3 10*3/uL — ABNORMAL HIGH (ref 4.0–10.5)
nRBC: 0 % (ref 0.0–0.2)

## 2020-03-20 LAB — COMPREHENSIVE METABOLIC PANEL
ALT: 16 U/L (ref 0–44)
AST: 18 U/L (ref 15–41)
Albumin: 3.5 g/dL (ref 3.5–5.0)
Alkaline Phosphatase: 58 U/L (ref 38–126)
Anion gap: 6 (ref 5–15)
BUN: 26 mg/dL — ABNORMAL HIGH (ref 8–23)
CO2: 27 mmol/L (ref 22–32)
Calcium: 9.1 mg/dL (ref 8.9–10.3)
Chloride: 105 mmol/L (ref 98–111)
Creatinine, Ser: 1.26 mg/dL — ABNORMAL HIGH (ref 0.61–1.24)
GFR, Estimated: >60 mL/min (ref >60)
Glucose, Bld: 101 mg/dL — ABNORMAL HIGH (ref 70–99)
Potassium: 4.4 mmol/L (ref 3.5–5.1)
Sodium: 138 mmol/L (ref 135–145)
Total Bilirubin: 0.7 mg/dL (ref 0.3–1.2)
Total Protein: 6.1 g/dL — ABNORMAL LOW (ref 6.5–8.1)

## 2020-03-20 MED ORDER — FREE WATER
100.0000 mL | Status: AC
  Administered 2020-03-20 – 2020-03-21 (×6): 100 mL via ORAL

## 2020-03-20 NOTE — Progress Notes (Addendum)
[facility] PROGRESS NOTE  David Oneill PNT:[id number] DOB: 08-May-1956 DOA: 02/21/2020 PCP: [name] [facility])    10/21: Kairyn sitting up in chair after performing ADLs  Status: Inpatient-Remains inpatient appropriate because:Altered mental status and Unsafe d/c plan   Dispo: The patient is from: Home              Anticipated d/c is to: Inpatient Geri Psych unit              Anticipated d/c date is: > 3 days              Patient currently is medically stable to d/c.  [place].  Needs Geripsych placement 2/2 presenting with suicide attempt-no geripsych beds available thru [facility].  Dr. Hulen Skains has communicated with administration at [facility] who are working on obtaining a bed for the patient.  Code Status: Full Family Communication: Patient only DVT prophylaxis: Xarelto Vaccination status: Fully vaccinated against Covid as of July 2122  HPI: 64 year old male admitted with suicidal intent and presented with drug overdose with oxycodone and Restoril along with wrist lacerations.  Patient initially admitted by PCCM.  CT head was noted to have a small occipital intraparenchymal hemorrhage and subarachnoid extension.  Psychiatry consulted and recommended inpatient psych.  Patient now awaiting [facility] psych placement.  Subjective: Awake.  Appears to be very frustrated regarding current diagnosis and preadmission circumstances. Pt tells me his exam at [facility] was "wrong" because he was reacting to the meds they gave him causing him to "not do well on the testing". He also is reporting that his family "took all my money, built a house with a room for me and a place to work on my truck then after I got sick kicked me out. They gave me a credit card (2nd time he said debit card) but it didn't have any money so I ended up sleeping in the yard. They're why I got so angry and did what I did". Needless to say difficult to fathom truth from fiction but I'm leaning toward dementia related  paranoia. I explained to pt that with early dementia thinks seem OK until functional tests of memory and day to day abilities turn up safety issues. He believes his family is trying to "put me away".   Objective: Vitals:   03/20/20 0613 03/20/20 0746  BP: 120/77   Pulse: 68   Resp: 16   Temp: 98.3 F (36.8 C)   SpO2: 98% 98%    Intake/Output Summary (Last 24 hours) at 03/20/2020 1223 Last data filed at 03/20/2020 0803 Gross per 24 hour  Intake 480 ml  Output 1600 ml  Net -1120 ml   Filed Weights   03/19/20 0055 03/19/20 0454 03/20/20 0613  Weight: 105.1 kg 105.1 kg 104 kg    Exam: Constitutional: NAD, awakened, comfortable Respiratory: clear to auscultation bilaterally, no wheezing, no crackles. Normal respiratory effort. No accessory muscle use.  Room air Cardiovascular: Irregular rate and rhythm underlying atrial fibrillation, no murmurs / rubs / gallops. No extremity edema.  Abdomen: no tenderness, no masses palpated. Bowel sounds positive.  Musculoskeletal: no clubbing / cyanosis. No joint deformity upper and lower extremities. Good ROM, no contractures. Normal muscle tone.  Skin: no rashes, lesions, ulcers. No induration.  Previous wrist lacerations well-healed Neurologic: CN 2-12 grossly intact. Sensation intact, DTR normal. Strength 5/5 x all 4 extremities.  Psychiatric: Alert and oriented x 3.  Does not appear to have appropriate insight into current circumstances and situation.  Flat affect although more animated today when discussing preadmission circumstances and possibility given previous diagnosis of dementia may need to discharge to an assisted living facility once cleared from a psychiatric standpoint.   Assessment/Plan: Non-aneurysmal small left occipital intraparenchymal hemorrhage with subarachnoid extension/ Acute toxic encephalopathy  -Secondary to drug overdose with oxycodone and Restoril along with trauma in the light of anticoagulant (Xarelto) use -OP  geriatric psych documentation patient has a history of polypharmacy and alcohol abuse as well with last alcohol use about 4 years ago -Xarelto reversed with Kcentra in the ED;Neurosurgery consulted;resumedxarelto 10/11 follow-up CT head unremarkable  Reported history of Wernicke's encephalopathy/history of memory loss/chronic insomnia -Has also been followed by [name] psych at [facility]: Differential during evaluation September 2021 included Alzheimer's dementia, frontotemporal alcoholic dementia, pseudodementia with depression or possible vascular etiology -September 2021 he was started on mirtazapine 15 mg HS by [name] psych-given symptoms complicated by ongoing depression and insomnia -MRI of the brain was ordered at that September visit -he was also referred to Dr. Paula Libra with geriatric psychiatry-it does not appear that MRI was completed nor has patient followed up with outpatient Geri psych  -TSH, RPR, and HIV negative. Iron was low and was replaced IV.  Suicide attempt - bilateral wrist laceration and intentional drug overdose -IP psych evaluated and recommended inpatient geri-psych placement-unfortunately [facility] does not have this type of bed available in our behavioral health inpatient unit -Continue suicide precautions with safety sitter at bedside-psychiatry documents high risk for suicide since lived alone prior to admission -LCSW request routine psych follow-up every 72 hours per request of outside facilities.  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  Recommend continuing Lexapro (had previously been on Cymbalta but had self discontinued this medication prior to September visit with [facility] psychiatrist and was subsequently started on Remeron).  -During this hospitalization Remeron and melatonin  were discontinued and Rozerem was started at bedtime. (OP documentation re: chronic insomnia) -Wrist lacerations repaired on 10/5 sutures removed on 10/25 with incisions unremarkable in appearance -10/27 patient requesting Cymbalta be renewed because previously prescribed to treat RLS along with Requip.  Patient has no recollection of telling providers he had voluntarily stopped this medication including conversations with providers at [facility] and at this facility since arrival. -10/27 DC'd Lexapro in favor of low-dose Cymbalta which will be slowly titrated up to previous dose of 60 mg/day  Insomnia/back pain -Continue Voltaren gel  -Had great results with ibuprofen but unfortunately creatinine steadily increased therefore this was discontinued.  Creatinine has decreased from 1.35 to 1.26 after discontinuation of ibuprofen.  Also with inconsistent oral intake of fluids which may also be influencing creatinine stability. -Encouraged to use Zanaflex at bedtime as well  RLS -Resume preadmission Cymbalta but at much lower dose and slowly titrate up every 3 to 5 days to in dose of 60 mg daily -Requip 2 mg q. HS  Protein calorie malnutrition Nutrition Status: Nutrition Problem: Inadequate oral intake Etiology: decreased appetite Signs/Symptoms: meal completion < 50% Interventions: Ensure Enlive (each supplement provides 350kcal and 20 grams of protein), MVI  Iron deficiency anemia -Likely related to poor nutrition in context of recurrent nausea since June -Iron 38 and was given 1000 mg of IV iron on 66/29  Chronic diastolic heart failure/pulmonary hypertension/TR -Euvolemic and compensated; cont I/O w/ daily wts -Has been followed by outpatient cardiology  for this as well as atrial fibrillation -Echocardiogram 12/31/19 from [facility] instead preserved EF 55 to 60% with mild aortic regurgitation and mild to moderate TR with RVSP 42 mmHg and mild pulmonary hypertension, aortic sinus moderately dilated  at 4.9 cm and a moderately dilated ascending aorta 5.0 cm  Chronic atrial fibrillation/flutter -Continue Xarelto -Rate controlled on metoprolol -Followed by cardiology and EP at [facility]  Recent Massive PE -Hospitalized in July of this year at [facility] non-STEMI characterized as demand ischemia at same time -Continue Xarelto  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 Joan Mayans at [facility]; patient remains in observational status without any active treatments  Obesity/OSA -BMI 31.33  -Patient will need to follow-up with his PCP on discharge and discuss lifestyle modifications -It appears patient underwent sleep study in the past few months but apparently [employer] denied CPAP  Chronic nausea -In review of outside records patient has been having these symptoms since June 2021 -Tolerating regular diet after SLP evaluation including MBSS demonstrated no evidence of dysphagia or other swallowing abnormalities -EGD this admission with esophageal plaques pathology negative for Candida. Mild duodenal bulb erythema and mildly congested mucosa in the second end of the duodenum pathology demonstrating acute inflammation.  -Continue PPI.  Discontinued famotidine and Reglan.   -Continue Zofran (on scheduled as well as prn for breakthrough). -Of note patient has been chronically using hemp flower and CBD oil as well as CBD vaping to assist with nausea symptoms-states has not used marijuana after returning to [place] from [place] neck pain -Followed by Dr. Celedonio Miyamoto at the clinics at [facility] -Recent MRI with mild to moderate degenerative disc disease with mild to severe facet joint arthrosis contributing to multilevel mild to severe neural foraminal stenosis and mild to moderate acquired spinal canal stenosis -Recommendation for PT    Data  Reviewed: Basic Metabolic Panel: Recent Labs  Lab 03/18/20 0915 03/19/20 0513 03/20/20 0335  NA 138 139 138  K 4.0 4.4 4.4  CL 103 103 105  CO2 28 27 27   GLUCOSE 102* 95 101*  BUN 20 21 26*  CREATININE 1.17 1.35* 1.26*  CALCIUM 9.3 9.3 9.1   Liver Function Tests: Recent Labs  Lab 03/18/20 0915 03/19/20 0513 03/20/20 0335  AST 19 17 18   ALT 14 15 16   ALKPHOS 54 59 58  BILITOT 1.0 0.8 0.7  PROT 6.6 6.1* 6.1*  ALBUMIN 3.7 3.7 3.5   No results for input(s): LIPASE, AMYLASE in the last 168 hours. No results for input(s): AMMONIA in the last 168 hours. CBC: Recent Labs  Lab 03/18/20 0915 03/19/20 0513 03/20/20 0335  WBC 11.4* 13.0* 13.3*  NEUTROABS 6.3 6.7 7.4  HGB 12.0* 12.1* 12.1*  HCT 38.8* 38.4* 38.3*  MCV 91.5 89.3 89.7  PLT 179 166 161   Cardiac Enzymes: No results for input(s): CKTOTAL, CKMB, CKMBINDEX, TROPONINI in the last 168 hours. BNP (last 3 results) Recent Labs    02/21/20 1144  BNP 359.3*    ProBNP (last 3 results) No results for input(s): PROBNP in the last 8760 hours.  CBG: No results for input(s): GLUCAP in the last 168 hours.  No results found for this or any previous visit (from the past 240 hour(s)).   Studies: No results found.  Scheduled Meds: . diclofenac Sodium  4 g Topical QID  . docusate sodium  100 mg Oral BID  . DULoxetine  40 mg Oral Daily  . feeding supplement  237 mL Oral BID BM  . free water  100 mL Oral Q4H  . metoprolol tartrate  12.5 mg Oral BID  . mometasone-formoterol  2 puff Inhalation BID  . multivitamin with minerals  1 tablet Oral Daily  . ondansetron  4 mg Oral TID WC & HS  . pantoprazole  40 mg Oral BID  . polyethylene glycol  17 g Oral Daily  . ramelteon  8 mg Oral QHS  . rivaroxaban  20 mg Oral QHS  . rOPINIRole  2 mg Oral QHS  . rosuvastatin  40 mg Oral Daily  . thiamine  100 mg Oral Daily  . 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 If 7PM-7AM, please contact night-coverage at [email or web address] 03/20/2020, 12:23 PM  LOS: 28 days          .

## 2020-03-20 NOTE — Progress Notes (Signed)
Nutrition Follow-up  RD working remotely.  DOCUMENTATION CODES:   Obesity unspecified  INTERVENTION:   -Continue Ensure Enlive po BID, each supplement provides 350 kcal and 20 grams of protein -Continue MVI with minerals daily -Continue Magic cup TID with meals, each supplement provides 290 kcal and 9 grams of protein  NUTRITION DIAGNOSIS:   Inadequate oral intake related to decreased appetite as evidenced by meal completion < 50%.  Ongoing  GOAL:   Patient will meet greater than or equal to 90% of their needs  Progressing   MONITOR:   PO intake, Supplement acceptance, Weight trends, Skin, Labs, I & O's  REASON FOR ASSESSMENT:   Malnutrition Screening Tool    ASSESSMENT:   Pt admitted with AMS 2/2 intentional drug OD and head trauma after fall. Pt also slit both wrists. PMH includes A.fib, s/p ablation x2, restrictive lung disease, OSA, HTN, large PEs 11/2019 prompting NSTEMI, CLL, and h/o EtOH abuse. 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  10/19- s/p EGD- revealed esophageal plaques suspicious for candidiasis (biopsied), mild duodenal bulb erythema and mildly congested mucosa in second portion of duodenum 10/20- s/p BSE- advanced to dysphagia 3 diet with thin liquids 10/21- s/p MBSS- advanced to regular diet with thin liquids 10/25- sutures removed from bilateral wrist wounds  Reviewed I/O's: -1.4 L x 24 hours and -8.8 L since 03/06/20  UOP: 1.6 L x 24 hous  Attempted to speak with pt via call to hospital room phone, however, no answer.   Pt remains with good appetite; noted meal completion 80-100%. He is consuming Ensure Enlive supplements.   Wt has been stable over the past month.   Medications reviewed and include colace, miralax, thiamine, and vitamin B-12.   Per MD notes, pt remains inpatient due to need for inpatient geri-psych placement.   Labs reviewed.   Diet Order:   Diet Order            Diet  regular Room service appropriate? Yes; Fluid consistency: Thin  Diet effective now                 EDUCATION NEEDS:   Not appropriate for education at this time  Skin:  Skin Assessment: Skin Integrity Issues: Skin Integrity Issues:: Other (Comment) Other: lacerations bilateral wrists  Last BM:  03/18/20  Height:   Ht Readings from Last 1 Encounters:  02/28/20 6' (1.829 m)    Weight:   Wt Readings from Last 1 Encounters:  03/20/20 104 kg    Ideal Body Weight:  80.91 kg  BMI:  Body mass index is 31.09 kg/m.  Estimated Nutritional Needs:   Kcal:  2200-2400  Protein:  120-135 grams  Fluid:  > 2 L    Loistine Chance, RD, LDN, [place] Registered Dietitian II Certified Diabetes Care and Education Specialist Please refer to [facility] for RD and/or RD on-call/weekend/after hours pager

## 2020-03-21 LAB — BASIC METABOLIC PANEL
Anion gap: 8 (ref 5–15)
BUN: 25 mg/dL — ABNORMAL HIGH (ref 8–23)
CO2: 27 mmol/L (ref 22–32)
Calcium: 9.7 mg/dL (ref 8.9–10.3)
Chloride: 102 mmol/L (ref 98–111)
Creatinine, Ser: 1.33 mg/dL — ABNORMAL HIGH (ref 0.61–1.24)
GFR, Estimated: 59 mL/min — ABNORMAL LOW (ref >60)
Glucose, Bld: 103 mg/dL — ABNORMAL HIGH (ref 70–99)
Potassium: 4.9 mmol/L (ref 3.5–5.1)
Sodium: 137 mmol/L (ref 135–145)

## 2020-03-21 MED ORDER — PANTOPRAZOLE SODIUM 40 MG PO TBEC
40.0000 mg | DELAYED_RELEASE_TABLET | Freq: Every day | ORAL | Status: DC
  Administered 2020-03-21: 40 mg via ORAL
  Filled 2020-03-21 (×2): qty 1

## 2020-03-21 NOTE — TOC Progression Note (Signed)
Transition of Care [facility]) - Progression Note    Patient Details  Name: David Oneill MRN: [id number] Date of Birth: 1956/02/22  Transition of Care [facility]) CM/SW [place], [place] Phone Number: 03/21/2020, 2:01 PM  Clinical Narrative:    No geripsych beds available for pt. CSW will continue to try and find placement for pt.         Expected Discharge Plan and Services                                                 Social Determinants of Health (SDOH) Interventions    Readmission Risk Interventions No flowsheet data found.

## 2020-03-21 NOTE — Care Management Important Message (Signed)
Important Message  Patient Details  Name: Tawan Degroote MRN: [id number] Date of Birth: 08-Jun-1955   Medicare Important Message Given:  Yes     Shelda Altes 03/21/2020, 9:02 AM

## 2020-03-21 NOTE — Progress Notes (Signed)
[facility] PROGRESS NOTE  David Oneill [facility]MC:[id number] DOB: 08-Mar-1956 DOA: 02/21/2020 PCP: [name])    10/21: [name] sitting up in chair after performing ADLs  Status: Inpatient-Remains inpatient appropriate because:Altered mental status and Unsafe d/c plan   Dispo: The patient is from: Home              Anticipated d/c is to: Inpatient Geri Psych unit              Anticipated d/c date is: > 3 days              Patient currently is medically stable to d/c.  [place].  Needs Geripsych placement 2/2 presenting with suicide attempt-no geripsych beds available thru [facility].  Dr. Hulen Skains has communicated with administration at [facility] who are working on obtaining a bed for the patient.  Code Status: Full Family Communication: Patient only DVT prophylaxis: Xarelto Vaccination status: Fully vaccinated against Covid as of July 449  HPI: 64 year old male admitted with suicidal intent and presented with drug overdose with oxycodone and Restoril along with wrist lacerations.  Patient initially admitted by PCCM.  CT head was noted to have a small occipital intraparenchymal hemorrhage and subarachnoid extension.  Psychiatry consulted and recommended inpatient psych.  Patient now awaiting [facility] psych placement.  Subjective: Awake.  No specific complaints.  Patient continues to verbalize concerns that previous neurocognitive evaluation as outpatient memory care clinic adversely affected by acute changes in physical health as well as undesired side effects from new psychiatric medications.  Patient is requesting new neurocognitive evaluation once placed at [place] facility  Objective: Vitals:   03/20/20 2117 03/21/20 0814  BP: 106/67   Pulse: 67   Resp: 20   Temp: 98.5 F (36.9 C)   SpO2: 99% 99%    Intake/Output Summary (Last 24 hours) at 03/21/2020 1209 Last data filed at 03/21/2020 0243 Gross per 24 hour  Intake 880 ml  Output 2300 ml  Net -1420 ml   Filed  Weights   03/19/20 0454 03/20/20 0613 03/21/20 0246  Weight: 105.1 kg 104 kg 104.1 kg    Exam: Constitutional: NAD, awake, comfortable; sitting up in chair.  Very animated discussing his favorite hobby of building cars and motors Respiratory: clear to auscultation bilaterally, no wheezing, no crackles. Normal respiratory effort. No accessory muscle use.  Room air Cardiovascular: Irregular rate and rhythm underlying atrial fibrillation, no murmurs / rubs / gallops. No extremity edema.  Abdomen: no tenderness, no masses palpated. Bowel sounds positive.  Musculoskeletal: no clubbing / cyanosis. No joint deformity upper and lower extremities. Good ROM, no contractures. Normal muscle tone.  Skin: no rashes, lesions, ulcers. No induration.  Previous wrist lacerations well-healed Neurologic: CN 2-12 grossly intact. Sensation intact, DTR normal. Strength 5/5 x all 4 extremities.  Psychiatric: Alert and oriented x 3.  At times appears to have lack of insight into current medical status.  Patient repeatedly reports issues regarding family not letting him have his money which has influenced his ability to maintain self-care in the outpatient setting.  Patient appears to be quite intelligent and was able to relate recent interest over the past several months regarding motor building and his research on YouTube.   Assessment/Plan: Non-aneurysmal small left occipital intraparenchymal hemorrhage with subarachnoid extension/ Acute toxic encephalopathy  -Secondary to drug overdose with oxycodone and Restoril along with trauma in the light of anticoagulant (Xarelto) use -OP geriatric psych documentation patient has a history of polypharmacy and alcohol abuse  as well with last alcohol use about 4 years ago -Xarelto reversed with Kcentra in the ED;Neurosurgery consulted;resumedxarelto 10/11 follow-up CT head unremarkable  Reported history of Wernicke's encephalopathy/history of memory loss/chronic insomnia -Has  also been followed by [name] psych at [facility]: Differential during evaluation September 2021 included Alzheimer's dementia, frontotemporal alcoholic dementia, pseudodementia with depression or possible vascular etiology -September 2021 he was started on mirtazapine 15 mg HS by [name] psych-given symptoms complicated by ongoing depression and insomnia -MRI of the brain was ordered at that September visit -he was also referred to Dr. Paula Libra with geriatric psychiatry-it does not appear that MRI was completed nor has patient followed up with outpatient Geri psych  -TSH, RPR, and HIV negative. Iron was low and was replaced IV. -Patient very concerned that previous outpatient neurocognitive evaluation adversely influenced by acute physical illness as well as undesired side effects from psychotropic medications he had recently been prescribed.  Of note during my interaction with the patient he has been alert and oriented x3, he is quite intelligent.  Today he was discussing in detail his hobby of fixing cars.  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 -Patient is quite intelligent therefore basic screening for dementia likely not efficient.  Agree that patient would benefit from repeat neurocognitive evaluation once placed at [place]  Suicide attempt - bilateral wrist laceration and intentional drug overdose -IP psych evaluated and recommended inpatient geri-psych placement-unfortunately [place] does not have this type of bed available in our behavioral health inpatient unit -Continue suicide precautions with safety sitter at bedside-psychiatry documents high risk for suicide since lived alone prior to admission -LCSW request routine psych follow-up every 72 hours per request of outside facilities.  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  Recommend continuing Lexapro (had previously been on Cymbalta but had self discontinued this medication prior to September visit with [facility] psychiatrist and was subsequently started on Remeron).  -During this hospitalization Remeron and melatonin were discontinued and Rozerem was started at bedtime. (OP documentation re: chronic insomnia) -Wrist lacerations repaired on 10/5 sutures removed on 10/25 with incisions unremarkable in appearance -10/27 patient requesting Cymbalta be renewed because previously prescribed to treat RLS along with Requip.  Patient has no recollection of telling providers he had voluntarily stopped this medication including conversations with providers at [facility] and at this facility since arrival. -10/27 DC'd Lexapro in favor of low-dose Cymbalta which will be slowly titrated up to previous dose of 60 mg/day  Insomnia/back pain -Continue Voltaren gel  -Had great results with ibuprofen but unfortunately creatinine steadily increased therefore this was discontinued.  Creatinine has decreased from 1.35 to 1.26 after discontinuation of ibuprofen.  Also with inconsistent oral intake of fluids which may also be influencing creatinine stability. -Encouraged to use Zanaflex at bedtime as well -Patient has been performing musculoskeletal stretches in the room which has also improved his back pain  RLS -Resume preadmission Cymbalta but at much lower dose and slowly titrate up every 3 to 5 days to in dose of 60 mg daily -Requip 2 mg q. HS  Protein calorie malnutrition Nutrition Status: Nutrition Problem: Inadequate oral intake Etiology: decreased appetite Signs/Symptoms: meal completion < 50% Interventions: Ensure Enlive (each supplement provides 350kcal and 20  grams of protein), MVI  Iron deficiency anemia -Likely related  to poor nutrition in context of recurrent nausea since June -Iron 38 and was given 1000 mg of IV iron on 26/83  Chronic diastolic heart failure/pulmonary hypertension/TR -Euvolemic and compensated; cont I/O w/ daily wts -Has been followed by outpatient cardiology for this as well as atrial fibrillation -Echocardiogram 12/31/19 from [facility] instead preserved EF 55 to 60% with mild aortic regurgitation and mild to moderate TR with RVSP 42 mmHg and mild pulmonary hypertension, aortic sinus moderately dilated at 4.9 cm and a moderately dilated ascending aorta 5.0 cm  Chronic atrial fibrillation/flutter -Continue Xarelto -Rate controlled on metoprolol -Followed by cardiology and EP at [facility] clinics  Recent [place] PE -Hospitalized in July of this year at [facility] non-STEMI characterized as demand ischemia at same time -Continue Xarelto  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 Joan Mayans at [facility]; patient remains in observational status without any active treatments  Obesity/OSA -BMI 31.33  -Patient will need to follow-up with his PCP on discharge and discuss lifestyle modifications -It appears patient underwent sleep study in the past few months but apparently [employer] denied CPAP  Chronic nausea -In review of outside records patient has been having these symptoms since June 2021 -Tolerating regular diet after SLP evaluation including MBSS demonstrated no evidence of dysphagia or other swallowing abnormalities -EGD this admission with esophageal plaques pathology negative for Candida. Mild duodenal bulb erythema and mildly congested mucosa in the second end of the duodenum pathology demonstrating acute inflammation.  -Continue PPI.  Discontinued famotidine and Reglan.   -Continue Zofran (on scheduled as well as prn for breakthrough). -Of note patient has been chronically using hemp  flower and CBD oil as well as CBD vaping to assist with nausea symptoms-states has not used marijuana after returning to [place] from [place] neck pain -Followed by Dr. Celedonio Miyamoto at the clinics at [facility] -Recent MRI with mild to moderate degenerative disc disease with mild to severe facet joint arthrosis contributing to multilevel mild to severe neural foraminal stenosis and mild to moderate acquired spinal canal stenosis -Recommendation for PT    Data Reviewed: Basic Metabolic Panel: Recent Labs  Lab 03/18/20 0915 03/19/20 0513 03/20/20 0335 03/21/20 0244  NA 138 139 138 137  K 4.0 4.4 4.4 4.9  CL 103 103 105 102  CO2 28 27 27 27   GLUCOSE 102* 95 101* 103*  BUN 20 21 26* 25*  CREATININE 1.17 1.35* 1.26* 1.33*  CALCIUM 9.3 9.3 9.1 9.7   Liver Function Tests: Recent Labs  Lab 03/18/20 0915 03/19/20 0513 03/20/20 0335  AST 19 17 18   ALT 14 15 16   ALKPHOS 54 59 58  BILITOT 1.0 0.8 0.7  PROT 6.6 6.1* 6.1*  ALBUMIN 3.7 3.7 3.5   No results for input(s): LIPASE, AMYLASE in the last 168 hours. No results for input(s): AMMONIA in the last 168 hours. CBC: Recent Labs  Lab 03/18/20 0915 03/19/20 0513 03/20/20 0335  WBC 11.4* 13.0* 13.3*  NEUTROABS 6.3 6.7 7.4  HGB 12.0* 12.1* 12.1*  HCT 38.8* 38.4* 38.3*  MCV 91.5 89.3 89.7  PLT 179 166 161   Cardiac Enzymes: No results for input(s): CKTOTAL, CKMB, CKMBINDEX, TROPONINI in the last 168 hours. BNP (last 3 results) Recent Labs    02/21/20 1144  BNP 359.3*    ProBNP (last 3 results) No results for input(s):  PROBNP in the last 8760 hours.  CBG: No results for input(s): GLUCAP in the last 168 hours.  No results found for this or any previous visit (from the past 240 hour(s)).   Studies: No results found.  Scheduled Meds: . diclofenac Sodium  4 g Topical QID  . docusate sodium  100 mg Oral BID  . DULoxetine  40 mg Oral Daily  . feeding supplement  237 mL  Oral BID BM  . metoprolol tartrate  12.5 mg Oral BID  . mometasone-formoterol  2 puff Inhalation BID  . multivitamin with minerals  1 tablet Oral Daily  . ondansetron  4 mg Oral TID WC & HS  . pantoprazole  40 mg Oral Daily  . polyethylene glycol  17 g Oral Daily  . ramelteon  8 mg Oral QHS  . rivaroxaban  20 mg Oral QHS  . rOPINIRole  2 mg Oral QHS  . rosuvastatin  40 mg Oral Daily  . thiamine  100 mg Oral Daily  . 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 If 7PM-7AM, please contact night-coverage at [email or web address] 03/21/2020, 12:09 PM  LOS: 29 days          .

## 2020-03-22 ENCOUNTER — Inpatient Hospital Stay: Payer: Medicare Other

## 2020-03-22 DIAGNOSIS — Z23 Encounter for immunization: Secondary | ICD-10-CM

## 2020-03-22 LAB — BASIC METABOLIC PANEL
Anion gap: 8 (ref 5–15)
BUN: 25 mg/dL — ABNORMAL HIGH (ref 8–23)
CO2: 28 mmol/L (ref 22–32)
Calcium: 9.5 mg/dL (ref 8.9–10.3)
Chloride: 103 mmol/L (ref 98–111)
Creatinine, Ser: 1.33 mg/dL — ABNORMAL HIGH (ref 0.61–1.24)
GFR, Estimated: 59 mL/min — ABNORMAL LOW (ref >60)
Glucose, Bld: 107 mg/dL — ABNORMAL HIGH (ref 70–99)
Potassium: 5.1 mmol/L (ref 3.5–5.1)
Sodium: 139 mmol/L (ref 135–145)

## 2020-03-22 MED ORDER — FAMOTIDINE 20 MG PO TABS
20.0000 mg | ORAL_TABLET | Freq: Two times a day (BID) | ORAL | Status: DC
  Administered 2020-03-22 – 2020-03-27 (×11): 20 mg via ORAL
  Filled 2020-03-22 (×11): qty 1

## 2020-03-22 MED ORDER — CARBAMIDE PEROXIDE 6.5 % OT SOLN
5.0000 [drp] | Freq: Two times a day (BID) | OTIC | Status: AC
  Administered 2020-03-22 – 2020-03-23 (×4): 5 [drp] via OTIC
  Filled 2020-03-22: qty 15

## 2020-03-22 NOTE — Progress Notes (Signed)
[facility] PROGRESS NOTE  David Oneill VFI:[id number] DOB: [birth year] DOA: 02/21/2020 PCP: [name] [facility])    10/21: Raman sitting up in chair after performing ADLs  Status: Inpatient-Remains inpatient appropriate because:Altered mental status and Unsafe d/c plan   Dispo: The patient is from: Home              Anticipated d/c is to: Inpatient Geri Psych unit              Anticipated d/c date is: > 3 days              Patient currently is medically stable to d/c.  [place].  Needs Geripsych placement 2/2 presenting with suicide attempt-no geripsych beds available thru [facility].  Dr. Hulen Skains has communicated with administration at [facility] who are working on obtaining a bed for the patient.  Code Status: Full Family Communication: Patient only DVT prophylaxis: Xarelto Vaccination status: Received first Covid vaccine on 7/8.  Patient requesting second dose.  Some concerns voiced by patient that PE in July may have been directly related to Covid vaccine.  Discussed with infectious disease physician Dr. [name] both agree that timing between patient's vaccine dose and presentation with PE symptoms not consistent with vaccine related coagulopathy (developed PE symptoms 48 hours after vaccine).  Also any concerns over possible activation of platelet antibodies related to vaccine would be ameliorated by current full dose anticoagulation for PE and A. fib.  HPI: 64 year old male admitted with suicidal intent and presented with drug overdose with oxycodone and Restoril along with wrist lacerations.  Patient initially admitted by PCCM.  CT head was noted to have a small occipital intraparenchymal hemorrhage and subarachnoid extension.  Psychiatry consulted and recommended inpatient psych.  Patient now awaiting [facility] psych placement.  Subjective: Awake.  Requesting something for water in ear canals which is a chronic problem for him.  Also requesting second Covid  dose.  Objective: Vitals:   03/22/20 0421 03/22/20 0720  BP: 110/86   Pulse: 68   Resp: 20   Temp: (!) 97.5 F (36.4 C)   SpO2: 100% 98%    Intake/Output Summary (Last 24 hours) at 03/22/2020 1144 Last data filed at 03/22/2020 0223 Gross per 24 hour  Intake 240 ml  Output 300 ml  Net -60 ml   Filed Weights   03/20/20 0613 03/21/20 0246 03/22/20 0224  Weight: 104 kg 104.1 kg 104.8 kg    Exam: Constitutional: NAD, awake, comfortable; sitting up in chair. Respiratory: clear to auscultation bilaterally, no wheezing, no crackles. Normal respiratory effort. No accessory muscle use.  Room air Cardiovascular: Irregular rate and rhythm w/ underlying atrial fibrillation, no murmurs / rubs / gallops. No extremity edema.  Abdomen: no tenderness, no masses palpated. Bowel sounds positive.  Musculoskeletal: no clubbing / cyanosis. Normal muscle tone.  Skin: no rashes, lesions, ulcers. No induration.  Previous wrist lacerations well-healed Neurologic: CN 2-12 grossly intact. Sensation intact, DTR normal. Strength 5/5 x all 4 extremities.  Psychiatric: Alert and oriented x 3.  At times appears to have lack of insight into current medical status.  Patient repeatedly reports issues regarding family not letting him have his money which has influenced his ability to maintain self-care in the outpatient setting.    Assessment/Plan: Non-aneurysmal small left occipital intraparenchymal hemorrhage with subarachnoid extension/ Acute toxic encephalopathy  -Secondary to drug overdose with oxycodone and Restoril along with trauma in the light of anticoagulant (Xarelto) use -OP geriatric psych documentation patient has a history  of polypharmacy and alcohol abuse although no documented alcohol intake since 2017 -Xarelto reversed with Kcentra in the ED;Neurosurgery consulted;resumedxarelto 10/11 follow-up CT head unremarkable  Reported history of Wernicke's encephalopathy/history of memory loss/chronic  insomnia -Has also been followed by [name] at [facility]: Differential during evaluation September 2021 included Alzheimer's dementia, frontotemporal alcoholic dementia, pseudodementia with depression or possible vascular etiology -September 2021 he was started on mirtazapine 15 mg HS by [name] psych-given symptoms complicated by ongoing depression and insomnia -MRI of the brain was ordered at that September visit -he was also referred to Dr. Paula Libra with geriatric psychiatry-it does not appear that MRI was completed nor has patient followed up with outpatient Geri psych  -TSH, RPR, and HIV negative. Iron was low and was replaced IV. -Patient very concerned that previous outpatient neurocognitive evaluation adversely influenced by acute physical illness as well as undesired side effects from psychotropic medications he had recently been prescribed.  Of note during my interaction with the patient he has been alert and oriented x3, he is quite intelligent.  Today he was discussing in detail his hobby of fixing cars.  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 -Patient is quite intelligent therefore basic screening for dementia likely not efficient.  Agree that patient would benefit from repeat neurocognitive evaluation once placed at [facility] note, one of the differentials stated at initial [place] psych evaluation in September 2021 was pseudodementia with depression  Suicide attempt - bilateral wrist laceration and intentional drug overdose -IP psych evaluated and recommended inpatient geri-psych placement-unfortunately [place] does not have this type of bed available in our behavioral health inpatient unit -Continue suicide precautions with safety sitter at bedside-psychiatry documents high risk for suicide since lived alone prior to admission -LCSW  request routine psych follow-up every 72 hours per request of outside facilities.  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  Recommend continuing Lexapro (had previously been on Cymbalta but had reportedly self discontinued this medication prior to September visit with [facility] psychiatrist and was subsequently started on Remeron).  -During this hospitalization Remeron and melatonin were discontinued and Rozerem was started at bedtime. (OP indication for Remeron re: chronic insomnia) -10/27 patient requesting Cymbalta be renewed because previously prescribed to treat RLS along with Requip.  Patient has no recollection of telling providers he had voluntarily stopped this medication including conversations with providers at [facility] and at this facility since arrival. -10/27 DC'd Lexapro in favor of low-dose Cymbalta which will be slowly titrated up to previous dose of 60 mg/day -11/4 has tolerated Cymbalta without any evidence of nausea or vomiting  Insomnia/back pain -Continue Voltaren gel  -Had great results with ibuprofen but unfortunately creatinine steadily increased therefore this was discontinued.  Creatinine has decreased from 1.35 to 1.26 after discontinuation of ibuprofen.  Also with inconsistent oral intake of fluids which may also be influencing creatinine stability. -Encouraged to use Zanaflex at bedtime as well -Patient has been performing musculoskeletal stretches in the room which has also improved his back pain  RLS -Resume preadmission Cymbalta but at much lower dose and slowly titrate up every 3 to 5 days to in dose of 60 mg daily -Requip 2 mg q. HS  Protein calorie malnutrition Nutrition Status: Nutrition Problem:  Inadequate oral intake Etiology: decreased appetite Signs/Symptoms: meal completion <  50% Interventions: Ensure Enlive (each supplement provides 350kcal and 20 grams of protein), MVI  Iron deficiency anemia -Likely related to poor nutrition in context of recurrent nausea since June -Iron 38 and was given 1000 mg of IV iron on 96/78  Chronic diastolic heart failure/pulmonary hypertension/TR -Euvolemic and compensated; cont I/O w/ daily wts -Has been followed by outpatient cardiology for this as well as atrial fibrillation -Echocardiogram 12/31/19 from [facility] instead preserved EF 55 to 60% with mild aortic regurgitation and mild to moderate TR with RVSP 42 mmHg and mild pulmonary hypertension, aortic sinus moderately dilated at 4.9 cm and a moderately dilated ascending aorta 5.0 cm  Chronic atrial fibrillation/flutter -Continue Xarelto -Rate controlled on metoprolol -Followed by cardiology and EP at [facility]  Recent Massive PE -Hospitalized in July of this year at [facility] non-STEMI characterized as demand ischemia at same time -Continue Xarelto  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 Joan Mayans at [facility]; patient remains in observational status without any active treatments  Obesity/OSA -BMI 31.33  -Patient will need to follow-up with his PCP on discharge and discuss lifestyle modifications -It appears patient underwent sleep study in the past few months but apparently [employer] denied CPAP  Chronic nausea -In review of outside records patient has been having these symptoms since June 2021 -Tolerating regular diet after SLP evaluation including MBSS demonstrated no evidence of dysphagia or other swallowing abnormalities -EGD this admission with esophageal plaques pathology negative for Candida. Mild duodenal bulb erythema and mildly congested mucosa in the second end of the duodenum pathology demonstrating acute inflammation.  -Continue PPI.  Discontinued  famotidine and Reglan.   -Continue Zofran (on scheduled as well as prn for breakthrough). -Of note patient has been chronically using hemp flower and CBD oil as well as CBD vaping to assist with nausea symptoms-states has not used marijuana after returning to [place] from [place] neck pain -Followed by Dr. Celedonio Miyamoto at the clinics at [facility] -Recent MRI with mild to moderate degenerative disc disease with mild to severe facet joint arthrosis contributing to multilevel mild to severe neural foraminal stenosis and mild to moderate acquired spinal canal stenosis -Recommendation for PT    Data Reviewed: Basic Metabolic Panel: Recent Labs  Lab 03/18/20 0915 03/19/20 0513 03/20/20 0335 03/21/20 0244 03/22/20 0240  NA 138 139 138 137 139  K 4.0 4.4 4.4 4.9 5.1  CL 103 103 105 102 103  CO2 28 27 27 27 28   GLUCOSE 102* 95 101* 103* 107*  BUN 20 21 26* 25* 25*  CREATININE 1.17 1.35* 1.26* 1.33* 1.33*  CALCIUM 9.3 9.3 9.1 9.7 9.5   Liver Function Tests: Recent Labs  Lab 03/18/20 0915 03/19/20 0513 03/20/20 0335  AST 19 17 18   ALT 14 15 16   ALKPHOS 54 59 58  BILITOT 1.0 0.8 0.7  PROT 6.6 6.1* 6.1*  ALBUMIN 3.7 3.7 3.5   No results for input(s): LIPASE, AMYLASE in the last 168 hours. No results for input(s): AMMONIA in the last 168 hours. CBC: Recent Labs  Lab 03/18/20 0915 03/19/20 0513 03/20/20 0335  WBC 11.4* 13.0* 13.3*  NEUTROABS 6.3 6.7 7.4  HGB 12.0* 12.1* 12.1*  HCT 38.8* 38.4* 38.3*  MCV 91.5 89.3 89.7  PLT 179 166 161   Cardiac Enzymes: No results for input(s): CKTOTAL, CKMB, CKMBINDEX, TROPONINI  in the last 168 hours. BNP (last 3 results) Recent Labs    02/21/20 1144  BNP 359.3*    ProBNP (last 3 results) No results for input(s): PROBNP in the last 8760 hours.  CBG: No results for input(s): GLUCAP in the last 168 hours.  No results found for this or any previous visit (from the past 240 hour(s)).    Studies: No results found.  Scheduled Meds: . carbamide peroxide  5 drop Both EARS BID  . diclofenac Sodium  4 g Topical QID  . docusate sodium  100 mg Oral BID  . DULoxetine  40 mg Oral Daily  . famotidine  20 mg Oral BID  . feeding supplement  237 mL Oral BID BM  . metoprolol tartrate  12.5 mg Oral BID  . mometasone-formoterol  2 puff Inhalation BID  . multivitamin with minerals  1 tablet Oral Daily  . ondansetron  4 mg Oral TID WC & HS  . polyethylene glycol  17 g Oral Daily  . ramelteon  8 mg Oral QHS  . rivaroxaban  20 mg Oral QHS  . rOPINIRole  2 mg Oral QHS  . rosuvastatin  40 mg Oral Daily  . thiamine  100 mg Oral Daily  . 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 If 7PM-7AM, please contact night-coverage at [email or web address] 03/22/2020, 11:44 AM  LOS: 30 days          .

## 2020-03-22 NOTE — Progress Notes (Signed)
   Covid-19 Vaccination Clinic  Name:  David Oneill    MRN: [id number] DOB: [birth year]  03/22/2020  [name] was observed post Covid-19 immunization for 15 minutes without incident. He was provided with Vaccine Information Sheet and instruction to access the V-Safe system.   [name] was instructed to call 911 with any severe reactions post vaccine: [name] Difficulty breathing  . Swelling of face and throat  . A fast heartbeat  . A bad rash all over body  . Dizziness and weakness   Immunizations Administered    Name Date Dose VIS Date Route   Pfizer COVID-19 Vaccine 03/22/2020 11:58 AM 0.3 mL 03/07/2020 Intramuscular   Manufacturer: [place]   Lot: [id number]   [place]: [address]-0

## 2020-03-23 DIAGNOSIS — F1210 Cannabis abuse, uncomplicated: Secondary | ICD-10-CM

## 2020-03-23 DIAGNOSIS — R1115 Cyclical vomiting syndrome unrelated to migraine: Secondary | ICD-10-CM

## 2020-03-23 MED ORDER — DULOXETINE HCL 60 MG PO CPEP
60.0000 mg | ORAL_CAPSULE | Freq: Every day | ORAL | Status: DC
  Administered 2020-03-24 – 2020-03-27 (×4): 60 mg via ORAL
  Filled 2020-03-23 (×4): qty 1

## 2020-03-23 NOTE — Progress Notes (Addendum)
[facility] PROGRESS NOTE  David Oneill KYH:[id number] DOB: 10/05/1955 DOA: 02/21/2020 PCP: David Oneill [name])    10/21: David Oneill sitting up in chair after performing ADLs  Status: Inpatient-Remains inpatient appropriate because:Altered mental status and Unsafe d/c plan   Dispo: The patient is from: Home              Anticipated d/c is to: Inpatient Geri Psych unit              Anticipated d/c date is: > 3 days              Patient currently is medically stable to d/c.  [place].  Needs Geripsych placement 2/2 presenting with suicide attempt-no geripsych beds available thru [facility].  Dr. Hulen Skains has communicated with administration at [facility] who are working on obtaining a bed for the patient. **Extensive discussion with patient's daughter David Oneill on 11/5.  Outlined history as below.  Patient cannot go live with his mother in [place] because of her severe dementia.  He cannot come live with her given his previous behaviors and most recent suicide attempt in front of her children.  We discussed that ALF may be an option given his dementia and of dementia severe enough and he lacks capacity he may need to be placed on a locked dementia unit to prevent elopement  Code Status: Full Family Communication: Patient only DVT prophylaxis: Xarelto Vaccination status: Received first Covid vaccine on 7/8.  Patient requesting second dose.  Some concerns voiced by patient that PE in July may have been directly related to Covid vaccine.  Discussed with infectious disease physician Dr. [name] both agree that timing between patient's vaccine dose and presentation with PE symptoms not consistent with vaccine related coagulopathy (developed PE symptoms 48 hours after vaccine).  Also any concerns over possible activation of platelet antibodies related to vaccine would be ameliorated by current full dose anticoagulation for PE and A. fib.  HPI: 64 year old male admitted with suicidal intent and  presented with drug overdose with oxycodone and Restoril along with wrist lacerations.  Patient initially admitted by PCCM.  CT head was noted to have a small occipital intraparenchymal hemorrhage and subarachnoid extension.  Psychiatry consulted and recommended inpatient psych.  Patient now awaiting [facility] psych placement.  Subjective: Awake.  Continues to have no insight into current memory issues and psychiatric condition.  Is fixated on discharging to [place] now that he has second Covid vaccine.  When I discussed where he would live if he moved to [place] states he could visit his mother but was unable to tell me if he could live with her or had another option available.  Of note, patient had no symptoms after second Covid vaccine  Objective: Vitals:   03/23/20 0415 03/23/20 0836  BP: 120/83   Pulse: 70   Resp: 18   Temp: 98.2 F (36.8 C)   SpO2: 100% 99%    Intake/Output Summary (Last 24 hours) at 03/23/2020 0921 Last data filed at 03/23/2020 0626 Gross per 24 hour  Intake --  Output 2600 ml  Net -2600 ml   Filed Weights   03/22/20 0224 03/23/20 0414 03/23/20 0415  Weight: 104.8 kg 104.5 kg 104.5 kg    Exam: Constitutional: NAD, awake, comfortable; sitting up in chair. Respiratory: clear to auscultation bilaterally, no wheezing, no crackles. Normal respiratory effort. No accessory muscle use.  Room air Cardiovascular: Irregular rate and rhythm w/ underlying atrial fibrillation, no murmurs / rubs / gallops. No extremity edema.  Abdomen: no tenderness, no masses palpated. Bowel sounds positive.  Musculoskeletal: no clubbing / cyanosis. Normal muscle tone.  Skin: no rashes, lesions, ulcers. No induration.  Previous wrist lacerations well-healed Neurologic: CN 2-12 grossly intact. Sensation intact, DTR normal. Strength 5/5 x all 4 extremities.  Psychiatric: Alert and oriented x 3.  At times appears to have lack of insight into current medical status.  Patient repeatedly reports  issues regarding family not letting him have his money which has influenced his ability to maintain self-care in the outpatient setting.    Assessment/Plan: Non-aneurysmal small left occipital intraparenchymal hemorrhage with subarachnoid extension/ Acute toxic encephalopathy  -RESOLVED AND STABLE ON FULL DOSE ANTICOAGULATION [facility] 02/27/2020-FU HEAD CT UNREMARKABLE -Secondary to drug overdose with oxycodone and Restoril along with trauma in the light of anticoagulant (Xarelto) use  Reported history of Wernicke's encephalopathy/Dementia -Has also been followed by [name] psych at [facility]: Differential during evaluation September 2021 included Alzheimer's dementia, frontotemporal alcoholic dementia, pseudodementia with depression or possible vascular etiology. OF NOTE HIS MOTHER HAS SEVERE DEMENTIA -OP geriatric psych documentation patient has a history of polypharmacy and alcohol abuse although no documented alcohol intake since 2017 -September 2021 he was started on mirtazapine 15 mg HS by [name] psych -11/5: Per my discussion with patient's daughter he has been exhibiting symptoms of depression and dementia symptoms for some time against by poor hygiene refusing to bathe, inability to remember to take medications unless prepared for him, refusal of medications when offered, going to pharmacies and refilling medications that had previously been discontinued, apparent hallucinations with reports that the ceiling looks like a lava lamp, difficulty remembering names of people, familiar streets, list of items and incoherent thought processes as well as paranoia regarding belief that his family is stealing his money and belongings. -Initial evaluation in September at memory clinic at [facility] was done to rule out reversible causes with Alzheimer's dementia as primary differential  Suicide attempt - bilateral wrist laceration and intentional drug overdose -IP psych evaluated and  recommended inpatient geri-psych placement-unfortunately [facility] does not have this type of bed available in our behavioral health inpatient unit -Continue suicide precautions with safety sitter at bedside-psychiatry documents high risk for suicide since lived alone prior to admission -LCSW request routine psych follow-up every 72 hours per request of outside facilities.  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  Recommend continuing Lexapro (had previously been on Cymbalta but had reportedly self discontinued this medication prior to September visit with [facility] psychiatrist and was subsequently started on Remeron).  -During this hospitalization Remeron and melatonin were discontinued and Rozerem was started at bedtime. (OP indication for Remeron re: chronic insomnia) -10/27 patient requesting Cymbalta be renewed because previously prescribed to treat RLS along with Requip.  Patient has no recollection of telling providers he had voluntarily stopped this medication including conversations with providers at [facility] and at this facility since arrival. -10/27 DC'd Lexapro in favor of low-dose Cymbalta which will be slowly titrated up to previous dose of 60 mg/day -11/4 has tolerated Cymbalta without any evidence of nausea or vomiting  CHRONIC Insomnia/back pain -Continue Voltaren gel  -No NSAIDs PO due to borderline GFR -Continue Zanaflex at HS -Patient has been performing musculoskeletal stretches in the room which has also improved his back pain  CHRONIC RLS -Continue Cymbalta tittrate dose to to end point 60 mg -  Continue Requip 2 mg q. HS  Protein calorie malnutrition Nutrition Status: Nutrition Problem: Inadequate oral intake (RESOLVED) Etiology: decreased appetite Signs/Symptoms: meal completion < 50%  (RESOLVED) Interventions: Ensure Enlive (each supplement provides 350kcal and 20 grams of protein), MVI  Iron deficiency anemia -Likely related to poor nutrition in context of recurrent nausea since June -Iron 38 and was given 1000 mg of IV iron on 10/21 -repeat anemia panel in 3-6 months  Chronic diastolic heart failure/pulmonary hypertension/TR -Euvolemic and compensated; cont I/O w/ daily wts -Has been followed by outpatient cardiology for this as well as atrial fibrillation -Echocardiogram 12/31/19 from [facility] instead preserved EF 55 to 60% with mild aortic regurgitation and mild to moderate TR with RVSP 42 mmHg and mild pulmonary hypertension, aortic sinus moderately dilated at 4.9 cm and a moderately dilated ascending aorta 5.0 cm  Chronic atrial fibrillation/flutter -Continue Xarelto -Rate controlled on metoprolol -Followed by cardiology and EP at [facility] clinics  Recent PE (July 2021) -Hospitalized in July of this year at [facility] non-STEMI characterized as demand ischemia at same time -CT at admission to [facility] in July: Small burden of distal segmental/subsegmental pulmonary emboli involving the left upper lobe. FU CT 3 days later: Small segmental/subsegmental pulmonary emboli in the left upper lobe described on recent PE study are is not well seen on today's exam.  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 Joan Mayans at [facility] patient remains in observational status without any active treatments  Obesity/OSA -BMI 31.33  -Patient will need to follow-up with his PCP on discharge and discuss lifestyle modifications -It appears patient underwent sleep study in the past few months but apparently [employer] denied CPAP  Chronic nausea with cyclic vomiting 2/2 daily THC use POA -STABLE AND TOLERATING  SOLID DIET -In review of outside records patient has been having these symptoms since June 2021 and according to daughter had been informed that his daily use of marijuana was the cause but patient refused to believe -Now that patient is in the hospital and does not have access to marijuana his nausea has resolved -Tolerating regular diet after SLP evaluation including MBSS demonstrated no evidence of dysphagia or other swallowing abnormalities -EGD this admission with esophageal plaques pathology negative for Candida. Mild duodenal bulb erythema and mildly congested mucosa in the second end of the duodenum pathology demonstrating acute inflammation.  -Continue H2 blocker -11/5: Discontinue scheduled Zofran but continue Zofran prn  Chronic neck pain -Followed by Dr. Celedonio Miyamoto at the clinics at [facility] with mild to moderate degenerative disc disease with mild to severe facet joint arthrosis contributing to multilevel mild to severe neural foraminal stenosis and mild to moderate acquired spinal canal stenosis -Recommendation for PT    Data Reviewed: Basic Metabolic Panel: Recent Labs  Lab 03/18/20 0915 03/19/20 0513 03/20/20 0335 03/21/20 0244 03/22/20 0240  NA 138 139 138 137 139  K 4.0 4.4 4.4 4.9 5.1  CL 103 103 105 102 103  CO2 28 27 27 27 28   GLUCOSE 102* 95 101* 103* 107*  BUN 20 21 26* 25* 25*  CREATININE 1.17 1.35* 1.26* 1.33* 1.33*  CALCIUM 9.3 9.3 9.1 9.7 9.5   Liver Function Tests: Recent Labs  Lab 03/18/20 0915 03/19/20 0513 03/20/20 0335  AST 19 17 18   ALT 14 15 16   ALKPHOS 54 59  58  BILITOT 1.0 0.8 0.7  PROT 6.6 6.1* 6.1*  ALBUMIN 3.7 3.7 3.5   No results for input(s): LIPASE, AMYLASE in the last 168 hours. No results for input(s): AMMONIA in the last 168 hours. CBC: Recent Labs  Lab 03/18/20 0915 03/19/20 0513 03/20/20 0335  WBC 11.4* 13.0* 13.3*  NEUTROABS 6.3 6.7 7.4  HGB 12.0* 12.1* 12.1*  HCT 38.8*  38.4* 38.3*  MCV 91.5 89.3 89.7  PLT 179 166 161   Cardiac Enzymes: No results for input(s): CKTOTAL, CKMB, CKMBINDEX, TROPONINI in the last 168 hours. BNP (last 3 results) Recent Labs    02/21/20 1144  BNP 359.3*    ProBNP (last 3 results) No results for input(s): PROBNP in the last 8760 hours.  CBG: No results for input(s): GLUCAP in the last 168 hours.  No results found for this or any previous visit (from the past 240 hour(s)).   Studies: No results found.  Scheduled Meds: . carbamide peroxide  5 drop Both EARS BID  . diclofenac Sodium  4 g Topical QID  . docusate sodium  100 mg Oral BID  . DULoxetine  40 mg Oral Daily  . famotidine  20 mg Oral BID  . feeding supplement  237 mL Oral BID BM  . metoprolol tartrate  12.5 mg Oral BID  . mometasone-formoterol  2 puff Inhalation BID  . multivitamin with minerals  1 tablet Oral Daily  . ondansetron  4 mg Oral TID WC & HS  . polyethylene glycol  17 g Oral Daily  . ramelteon  8 mg Oral QHS  . rivaroxaban  20 mg Oral QHS  . rOPINIRole  2 mg Oral QHS  . rosuvastatin  40 mg Oral Daily  . thiamine  100 mg Oral Daily  . 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 If  7PM-7AM, please contact night-coverage at [email or web address] 03/23/2020, 9:21 AM  LOS: 31 days          .

## 2020-03-23 NOTE — TOC Progression Note (Signed)
Transition of Care [facility]) - Progression Note    Patient Details  Name: David Oneill MRN: [id number] Date of Birth: 11/17/1955  Transition of Care [facility]) CM/SW [place], [place] Phone Number: 03/23/2020, 12:51 PM  Clinical Narrative:    CSW spoke with pt's daughter/POA [name] in reference to other possible dc options if pt cleared by psych. Pt disclosed to NP Ebony Hail that he could possibly go to live with his mother in [place]. [name] stated pt's mom has severe dementia and is being cared for by another relative. [name] stated pt's brother is a travel Therapist, sports and is gone on assignments every three months. [name] stated that it was not an option for pt to return to her home as she and her family have dealt with pt's ongoing behaviors for 3 1/2 years. [name] stated that the hospital needs to have him evaluated, I advised that pt has been evaluated and is checked on by psych weekly. [name] stated she will have pt IVC'd if she needs too as he needs to go to long term geri-psych. CSW has reached out to four facilities with no luck on placement due to pt medical status. TOC leadership has been made aware and will look into seeing if pt is a candidate for [facility]        Expected Discharge Plan and Services                                                 Social Determinants of Health (SDOH) Interventions    Readmission Risk Interventions No flowsheet data found.

## 2020-03-24 DIAGNOSIS — F4321 Adjustment disorder with depressed mood: Secondary | ICD-10-CM

## 2020-03-24 NOTE — Consult Note (Signed)
[facility] Consult   Reason for Consult: ''capacity evaluation for discharge and medical decision making''.  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 Today, patient is alert, awake, oriented to time, person, place and situation. Patient denies that he was intentionally trying to kill himself, he reports that he took all those medications and cut his wrist most likely due to the effect of medications that he was prescribed at [facility] after he developed  "blood clots and a heart attack after I got Covid-19 vaccine.'' and is now unable to drive. Currently, he denies delusions, psychosis, self harming thoughts but says he is only disappointed that his daughter with whom he lives is not happy with him because of what happened. Patient denies prior history of suicide attempt or memory problem. He scored 28/30 on mini-mental status test. He reports that he is in stable  condition and has not refused any medical treatment offered to him.   Past Psychiatric History: depression  Risk to Self:  denies Risk to Others:  none Prior Inpatient Therapy:  denies Prior Outpatient Therapy:  none  Past Medical History:  Past Medical History:  Diagnosis Date  . Chronic back pain   . Sleep apnea   . Suicide attempt ([place]) 02/22/2020    Past Surgical History:  Procedure Laterality Date  . ABLATION OF DYSRHYTHMIC FOCUS    . BIOPSY  03/06/2020   Procedure: BIOPSY;  Surgeon: Ladene Artist, MD;  Location: [facility] ENDOSCOPY;  Service: Gastroenterology;;  . ESOPHAGOGASTRODUODENOSCOPY N/A 03/06/2020   Procedure: ESOPHAGOGASTRODUODENOSCOPY (EGD);  Surgeon: Ladene Artist, MD;  Location: [place];  Service: Gastroenterology;  Laterality: N/A;   Family History: History reviewed. No pertinent family history. Family Psychiatric  History: none Social History:  Social History   Substance and Sexual Activity  Alcohol Use Not Currently     Social History   Substance and Sexual Activity  Drug Use Yes  . Types: Marijuana    Social History   Socioeconomic History  . Marital status: Single    Spouse name: Not on file  . Number of children: Not on file  . Years of education: Not on file  . Highest education level: Not on file  Occupational History  . Not on file  Tobacco Use  . Smoking status: Never Smoker  . Smokeless tobacco: Never Used  Vaping Use  . Vaping Use: Never used  Substance and Sexual Activity  . Alcohol use: Not Currently  . Drug use: Yes    Types: Marijuana  . Sexual activity: Not on  file  Other Topics Concern  . Not on file  Social History Narrative  . Not on file   Social Determinants of Health   Financial Resource Strain:   . Difficulty of Paying Living Expenses: Not on file  Food Insecurity:   . Worried About Charity fundraiser in the Last Year: Not on file  . Ran Out of Food in the Last Year: Not on file  Transportation Needs:    . Lack of Transportation (Medical): Not on file  . Lack of Transportation (Non-Medical): Not on file  Physical Activity:   . Days of Exercise per Week: Not on file  . Minutes of Exercise per Session: Not on file  Stress:   . Feeling of Stress : Not on file  Social Connections:   . Frequency of Communication with Friends and Family: Not on file  . Frequency of Social Gatherings with Friends and Family: Not on file  . Attends Religious Services: Not on file  . Active Member of Clubs or Organizations: Not on file  . Attends Archivist Meetings: Not on file  . Marital Status: Not on file   Additional Social History:    Allergies:  No Known Allergies  Labs:  No results found for this or any previous visit (from the past 48 hour(s)).  Current Facility-Administered Medications  Medication Dose Route Frequency Provider Last Rate Last Admin  . acetaminophen (TYLENOL) tablet 650 mg  650 mg Oral Q6H PRN Jose Persia, MD   650 mg at 03/24/20 0610  . albuterol (PROVENTIL) (2.5 MG/3ML) 0.083% nebulizer solution 2.5 mg  2.5 mg Nebulization Q2H PRN Magdalen Spatz, NP      . carbamide peroxide (DEBROX) 6.5 % OTIC (EAR) solution 5 drop  5 drop Both EARS BID Samella Parr, NP   5 drop at 03/23/20 2251  . diclofenac Sodium (VOLTAREN) 1 % topical gel 4 g  4 g Topical QID Samella Parr, NP   4 g at 03/24/20 0911  . docusate sodium (COLACE) capsule 100 mg  100 mg Oral BID Modena Jansky, MD   100 mg at 03/24/20 0910  . DULoxetine (CYMBALTA) DR capsule 60 mg  60 mg Oral Daily Samella Parr, NP   60 mg at 03/24/20 0910  . famotidine (PEPCID) tablet 20 mg  20 mg Oral BID Samella Parr, NP   20 mg at 03/24/20 0910  . feeding supplement (ENSURE ENLIVE / ENSURE PLUS) liquid 237 mL  237 mL Oral BID BM Samella Parr, NP   237 mL at 03/24/20 0911  . fluticasone (FLONASE) 50 MCG/ACT nasal spray 2 spray  2 spray Each Nare Daily PRN Eulogio Bear U, DO   2 spray at 03/22/20 2047  .  metoprolol tartrate (LOPRESSOR) tablet 12.5 mg  12.5 mg Oral BID Eulogio Bear U, DO   12.5 mg at 03/24/20 0910  . mometasone-formoterol (DULERA) 100-5 MCG/ACT inhaler 2 puff  2 puff Inhalation BID Jose Persia, MD   2 puff at 03/24/20 0743  . multivitamin with minerals tablet 1 tablet  1 tablet Oral Daily Jacky Kindle, MD   1 tablet at 03/24/20 0910  . ondansetron (ZOFRAN) injection 4 mg  4 mg Intravenous Q6H PRN Magdalen Spatz, NP   4 mg at 03/23/20 2120  . polyethylene glycol (MIRALAX / GLYCOLAX) packet 17 g  17 g Oral Daily Modena Jansky, MD   17 g at 03/24/20 0910  . ramelteon (ROZEREM) tablet  8 mg  8 mg Oral QHS Patrecia Pour, NP   8 mg at 03/23/20 2250  . rivaroxaban (XARELTO) tablet 20 mg  20 mg Oral QHS Nelida Meuse III, MD   20 mg at 03/23/20 1716  . rOPINIRole (REQUIP) tablet 2 mg  2 mg Oral QHS Samella Parr, NP   2 mg at 03/23/20 2250  . rosuvastatin (CRESTOR) tablet 40 mg  40 mg Oral Daily Samella Parr, NP   40 mg at 03/24/20 0910  . thiamine tablet 100 mg  100 mg Oral Daily Eulogio Bear U, DO   100 mg at 03/24/20 0910  . tiZANidine (ZANAFLEX) tablet 4 mg  4 mg Oral Q8H PRN Samella Parr, NP   4 mg at 03/24/20 0745  . vitamin B-12 (CYANOCOBALAMIN) tablet 1,000 mcg  1,000 mcg Oral Daily Eulogio Bear U, DO   1,000 mcg at 03/24/20 0910    Musculoskeletal: Strength & Muscle Tone: not tested Gait & Station: normal Patient leans: N/A  Psychiatric Specialty Exam: Physical Exam Vitals and nursing note reviewed.  Constitutional:      Appearance: Normal appearance.  HENT:     Head: Normocephalic.     Nose: Nose normal.  Pulmonary:     Effort: Pulmonary effort is normal.  Musculoskeletal:        General: Normal range of motion.     Cervical back: Normal range of motion.  Neurological:     General: No focal deficit present.     Mental Status: He is alert and oriented to person, place, and time.  Psychiatric:        Attention and Perception: Attention and  perception normal.        Mood and Affect: Mood and affect normal.        Speech: Speech normal.        Behavior: Behavior normal. Behavior is cooperative.        Thought Content: Thought content normal.        Cognition and Memory: Cognition and memory normal.        Judgment: Judgment normal.     Review of Systems  Constitutional: Negative.   HENT: Negative.   Eyes: Negative.   Psychiatric/Behavioral: Positive for dysphoric mood.  All other systems reviewed and are negative.   Blood pressure 131/76, pulse 76, temperature 98.1 F (36.7 C), temperature source Oral, resp. rate 17, height 6' (1.829 m), weight 104.3 kg, SpO2 98 %.Body mass index is 31.18 kg/m.  General Appearance: Casual  Eye Contact:  Good  Speech:  Clear and Coherent and Normal Rate  Volume:  Normal  Mood:  Dysphoric, anxious  Affect:  Appropriate  Thought Process:  Coherent, Linear and Descriptions of Associations: Intact  Orientation:  Full (Time, Place, and Person)  Thought Content:  Logical  Suicidal Thoughts:  No  Homicidal Thoughts:  No  Memory:  Immediate;   Good Recent;   Good Remote;   Good  Judgement:  Intact  Insight:  Present  Psychomotor Activity:  Normal  Concentration:  Concentration: Good and Attention Span: Good  Recall:  Good  Fund of Knowledge:  Good  Language:  Good  Akathisia:  No  Handed:  Right  AIMS (if indicated):     Assets:  Communication Skills Desire for Improvement Leisure Time Resilience Social Support  ADL's:  Intact  Cognition:  WNL  Sleep:        Treatment Plan Summary: 64 year old male who was admitted  to the hospital after he accidentally overdosed on his medications and cut his wrist but he states that he was not trying to end his life. Today, he is alert, awake, oriented, denies psychosis, delusions, self harming thoughts but only disappointed that his daughter does not want him to come back home to live with her. Patient scored 28/30 on mini-mental status  examination as at today, a score higher than 25/30 indicates no cognitive. Based on my evaluation, patient has capacity to make medication decision and no longer meet criteria for admission to Geriatric psychiatric hospital. But be aware that capacity to make medical decision is a subjective evaluation that changes from day to day and a more objective evaluation is competency evaluation that can be ordered by a judge.  Recommendations: -Consider reaching out to patient's family to explore Competency assessment through a Judge   Disposition: No evidence of imminent risk to self or others at present.   Patient does not meet criteria for psychiatric inpatient admission. Supportive therapy provided about ongoing stressors. Psychiatric service signing out. Re-consult as needed  Corena Pilgrim, MD 03/24/2020 12:52 PM

## 2020-03-24 NOTE — Progress Notes (Addendum)
[facility] PROGRESS NOTE  David Oneill XFG:[id number] DOB: October 28, 1955 DOA: 02/21/2020 PCP: David Oneill [facility])    10/21: David Oneill sitting up in chair after performing ADLs  Status: Inpatient-Remains inpatient appropriate because:Altered mental status and Unsafe d/c plan   Dispo: The patient is from: Home              Anticipated d/c is to: Inpatient Geri Psych unit              Anticipated d/c date is: > 3 days              Patient currently is medically stable to d/c.  [place].  Needs Geripsych placement 2/2 presenting with suicide attempt-no geripsych beds available thru [facility].  Dr. Hulen Skains has communicated with administration at [facility] who are working on obtaining a bed for the patient. **Extensive discussion with patient's daughter [name] on 11/5.  Outlined history as below.  Patient cannot go live with his mother in [place] because of her severe dementia.  He cannot come live with her given his previous behaviors and most recent suicide attempt in front of her children.  We discussed that ALF may be an option given his dementia and of dementia severe enough and he lacks capacity he may need to be placed on a locked dementia unit to prevent elopement  Code Status: Full Family Communication: Patient only DVT prophylaxis: Xarelto Vaccination status: Received first Covid vaccine on 7/8.  Patient requesting second dose.  Some concerns voiced by patient that PE in July may have been directly related to Covid vaccine.  Discussed with infectious disease physician Dr. [name] both agree that timing between patient's vaccine dose and presentation with PE symptoms not consistent with vaccine related coagulopathy (developed PE symptoms 48 hours after vaccine).  Also any concerns over possible activation of platelet antibodies related to vaccine would be ameliorated by current full dose anticoagulation for PE and A. fib.  HPI: 64 year old male admitted with suicidal intent and  presented with drug overdose with oxycodone and Restoril along with wrist lacerations.  Patient initially admitted by PCCM.  CT head was noted to have a small occipital intraparenchymal hemorrhage and subarachnoid extension.  Psychiatry consulted and recommended inpatient psych.  Patient now awaiting [facility] psych placement.  Subjective: Patient was seen and examined at bedside.  Overnight events noted.  He reports of being nauseous,  he was given Zofran which improved his nausea otherwise he denies any other concerns.  Objective: Vitals:   03/24/20 0744 03/24/20 1149  BP:  131/76  Pulse:  76  Resp:  17  Temp:  98.1 F (36.7 C)  SpO2: 99% 98%    Intake/Output Summary (Last 24 hours) at 03/24/2020 1510 Last data filed at 03/24/2020 1231 Gross per 24 hour  Intake 1420 ml  Output 3250 ml  Net -1830 ml   Filed Weights   03/23/20 0414 03/23/20 0415 03/24/20 0242  Weight: 104.5 kg 104.5 kg 104.3 kg    Exam: Constitutional: NAD, awake, comfortable; sitting up in chair. Respiratory: clear to auscultation bilaterally, no wheezing, no crackles. Normal respiratory effort.  Cardiovascular: Irregular rate and rhythm w/ underlying atrial fibrillation, no murmurs / rubs / gallops. No extremity edema.  Abdomen: no tenderness, no masses palpated. Bowel sounds positive.  Musculoskeletal: no clubbing / cyanosis. Normal muscle tone.  Skin: no rashes, lesions, ulcers. No induration.  Previous wrist lacerations well-healed Neurologic: CN 2-12 grossly intact. Sensation intact, DTR normal. Strength 5/5 x all 4 extremities.  Psychiatric: Alert and oriented x 3.  At times appears to have lack of insight into current medical status.     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 OF NOTE HIS MOTHER HAS SEVERE DEMENTIA -OP geriatric psych documentation patient has a history of polypharmacy and alcohol abuse although no documented alcohol intake since 2017 -September 2021 he was started on mirtazapine 15 mg HS by [name] psych -11/5: Per my discussion with patient's daughter he has been exhibiting symptoms of depression and dementia symptoms for some time against by poor hygiene refusing to bathe, inability to remember to take medications unless prepared for him, refusal of medications when offered, going to pharmacies and refilling medications that had previously been discontinued, apparent hallucinations with reports that the ceiling looks like a lava lamp, difficulty remembering names of people, familiar streets, list of items and incoherent thought processes as well as paranoia regarding belief that his family is stealing his money and belongings. -Initial evaluation in September at memory clinic at [facility] was done to rule out reversible causes with Alzheimer's dementia as primary differential  Suicide attempt - bilateral wrist laceration and intentional drug overdose -IP psych evaluated and recommended inpatient geri-psych placement-unfortunately [facility] does not have this type of bed available in our behavioral health inpatient unit -Continue suicide precautions with safety sitter at bedside-psychiatry documents high risk for suicide since lived alone prior to admission -LCSW request routine psych follow-up every 72 hours per request of outside facilities.  Labs and chest x-ray have remained  stable -Psychiatry reevaluated patient on 03/03/2020,03/09/2020, 03/12/2020 and 03/14/2020 in regards to major depressive disorder which is recurrent and severe without psychosis.  Recommend continuing Lexapro (had previously been on Cymbalta but had reportedly self discontinued this medication prior to September visit with [facility] psychiatrist and was subsequently started on Remeron).  -During this hospitalization Remeron and melatonin were discontinued and Rozerem was started at bedtime. (OP indication for Remeron re: chronic insomnia) -10/27 patient requesting Cymbalta be renewed because previously prescribed to treat RLS along with Requip.  Patient has no recollection of telling providers he had voluntarily stopped this medication including conversations with providers at [facility] and at this facility since arrival. -10/27 DC'd Lexapro in favor of low-dose Cymbalta which will be slowly titrated up to previous dose of 60 mg/day -11/4 has tolerated Cymbalta without any evidence of nausea or vomiting  CHRONIC Insomnia/back pain -Continue Voltaren gel  -No NSAIDs PO due to borderline GFR -Continue Zanaflex at HS -Patient has been performing musculoskeletal stretches in the room which has also improved his back pain  CHRONIC RLS -Continue Cymbalta tittrate dose to to end point 60 mg -Continue Requip 2 mg q. HS  Protein calorie malnutrition Nutrition Status: Nutrition Problem: Inadequate oral intake (RESOLVED) Etiology: decreased appetite Signs/Symptoms: meal completion < 50% (RESOLVED) Interventions: Ensure Enlive (each supplement provides 350kcal and 20 grams of protein), MVI  Iron deficiency anemia -Likely related to poor nutrition in context of recurrent nausea since June -Iron 38 and was given 1000 mg of IV iron on 10/21 -repeat anemia panel  in 3-6 months  Chronic diastolic heart failure/pulmonary hypertension/TR -Euvolemic and compensated; cont I/O w/ daily wts -Has been followed by  outpatient cardiology for this as well as atrial fibrillation -Echocardiogram 12/31/19 from [facility] instead preserved EF 55 to 60% with mild aortic regurgitation and mild to moderate TR with RVSP 42 mmHg and mild pulmonary hypertension, aortic sinus moderately dilated at 4.9 cm and a moderately dilated ascending aorta 5.0 cm  Chronic atrial fibrillation/flutter -Continue Xarelto -Rate controlled on metoprolol -Followed by cardiology and EP at [facility] clinics  Recent PE (July 2021) -Hospitalized in July of this year at [facility] non-STEMI characterized as demand ischemia at same time -CT at admission to [facility] in July: Small burden of distal segmental/subsegmental pulmonary emboli involving the left upper lobe. FU CT 3 days later: Small segmental/subsegmental pulmonary emboli in the left upper lobe described on recent PE study are is not well seen on today's exam.  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 Joan Mayans at [facility]; patient remains in observational status without any active treatments  Obesity/OSA -BMI 31.33  -Patient will need to follow-up with his PCP on discharge and discuss lifestyle modifications -It appears patient underwent sleep study in the past few months but apparently [employer] denied CPAP  Chronic nausea with cyclic vomiting 2/2 daily THC use POA -STABLE AND TOLERATING SOLID DIET -In review of outside records patient has been having these symptoms since June 2021 and according to daughter had been informed that his daily use of marijuana was the cause but patient refused to believe -Now that patient is in the hospital and does not have access to marijuana his nausea has resolved -Tolerating regular diet after SLP evaluation including MBSS demonstrated no evidence of dysphagia or other  swallowing abnormalities -EGD this admission with esophageal plaques pathology negative for Candida. Mild duodenal bulb erythema and mildly congested mucosa in the second end of the duodenum pathology demonstrating acute inflammation.  -Continue H2 blocker -11/5: Discontinue scheduled Zofran but continue Zofran prn  Chronic neck pain -Followed by Dr. Celedonio Miyamoto at the clinics at [facility] -Recent MRI with mild to moderate degenerative disc disease with mild to severe facet joint arthrosis contributing to multilevel mild to severe neural foraminal stenosis and mild to moderate acquired spinal canal stenosis -Recommendation for PT    Data Reviewed: Basic Metabolic Panel: Recent Labs  Lab 03/18/20 0915 03/19/20 0513 03/20/20 0335 03/21/20 0244 03/22/20 0240  NA 138 139 138 137 139  K 4.0 4.4 4.4 4.9 5.1  CL 103 103 105 102 103  CO2 28 27 27 27 28   GLUCOSE 102* 95 101* 103* 107*  BUN 20 21 26* 25* 25*  CREATININE 1.17 1.35* 1.26* 1.33* 1.33*  CALCIUM 9.3 9.3 9.1 9.7 9.5   Liver Function Tests: Recent Labs  Lab 03/18/20 0915 03/19/20 0513 03/20/20 0335  AST 19 17 18   ALT 14 15 16   ALKPHOS 54 59 58  BILITOT 1.0 0.8 0.7  PROT 6.6 6.1* 6.1*  ALBUMIN 3.7 3.7 3.5   No results for input(s): LIPASE, AMYLASE in the last 168 hours. No results for input(s): AMMONIA in the last 168 hours. CBC: Recent Labs  Lab 03/18/20 0915 03/19/20 0513 03/20/20 0335  WBC 11.4* 13.0* 13.3*  NEUTROABS 6.3 6.7 7.4  HGB 12.0* 12.1* 12.1*  HCT 38.8* 38.4* 38.3*  MCV 91.5 89.3 89.7  PLT 179 166 161   Cardiac Enzymes: No results for input(s): CKTOTAL, CKMB, CKMBINDEX, TROPONINI in the last 168 hours. BNP (last 3 results) Recent Labs    02/21/20 1144  BNP 359.3*    ProBNP (last 3 results) No results for input(s): PROBNP in the last 8760 hours.  CBG: No results for input(s): GLUCAP in the last 168 hours.  No results found for this or any previous visit  (from the past 240 hour(s)).   Studies: No results found.  Scheduled Meds: . carbamide peroxide  5 drop Both EARS BID  . diclofenac Sodium  4 g Topical QID  . docusate sodium  100 mg Oral BID  . DULoxetine  60 mg Oral Daily  . famotidine  20 mg Oral BID  . feeding supplement  237 mL Oral BID BM  . metoprolol tartrate  12.5 mg Oral BID  . mometasone-formoterol  2 puff Inhalation BID  . multivitamin with minerals  1 tablet Oral Daily  . polyethylene glycol  17 g Oral Daily  . ramelteon  8 mg Oral QHS  . rivaroxaban  20 mg Oral QHS  . rOPINIRole  2 mg Oral QHS  . rosuvastatin  40 mg Oral Daily  . thiamine  100 mg Oral Daily  . 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    Shawna Clamp , MD [facility] Pager [phone]. If 7PM-7AM, please contact night-coverage at [email or web address] 03/24/2020, 3:10 PM  LOS: 32 days          .

## 2020-03-25 NOTE — Progress Notes (Signed)
[facility] PROGRESS NOTE  David Oneill ELF:[id number] DOB: [birth year] DOA: 02/21/2020 PCP: [name])   Status: Inpatient-Remains inpatient appropriate because:Altered mental status and Unsafe d/c plan   Dispo: The patient is from: Home              Anticipated d/c is to: Inpatient Geri Psych unit vs. ALF              Anticipated d/c date is: > 3 days              Patient currently is medically stable to d/c.  [place].  Needs Geripsych placement 2/2 presenting with suicide attempt-no geripsych beds available thru [facility].  Dr. Hulen Skains has communicated with administration at [facility] who are working on obtaining a bed for the patient. **Extensive discussion with patient's daughter [name] on 11/5.  Outlined history as below.  Patient cannot go live with his mother in [place] because of her severe dementia.  He cannot come live with her given his previous behaviors and most recent suicide attempt in front of her children.  We discussed that ALF may be an option given his dementia and of dementia severe enough and he lacks capacity he may need to be placed on a locked dementia unit to prevent elopement. 11/6:  Psychiatry revaluation: No evidence of imminent risk to self or others at present.   Patient does not meet criteria for psychiatric inpatient admission.  Code Status: Full Family Communication: Patient only DVT prophylaxis: Xarelto Vaccination status: Received first Covid vaccine on 7/8.  Patient requesting second dose.  Some concerns voiced by patient that PE in July may have been directly related to Covid vaccine.  Discussed with infectious disease physician Dr. [name] both agree that timing between patient's vaccine dose and presentation with PE symptoms not consistent with vaccine related coagulopathy (developed PE symptoms 48 hours after vaccine).  Also any concerns over possible activation of platelet antibodies related to vaccine would be ameliorated by current  full dose anticoagulation for PE and A. fib.  HPI: 64 year old male admitted with suicidal intent and presented with drug overdose with oxycodone and Restoril along with wrist lacerations. Patient initially admitted by PCCM.  CT head was noted to have a small occipital intraparenchymal hemorrhage and subarachnoid extension. Psychiatry consulted and recommended inpatient psych.  Patient now awaiting [facility] psych placement.  Subjective: Patient was seen and examined at bedside.  Overnight events noted.  He reports Left arm soreness from vaccine, states he is forgetting to get Zanaflex at night , asks if he gets half tablet Zanaflex every 4 hrs instead of every 8 hrs.  Objective: Vitals:   03/25/20 0900 03/25/20 0956  BP: 139/76   Pulse: 70 97  Resp: 17 14  Temp: 98.2 F (36.8 C)   SpO2: 100% 97%    Intake/Output Summary (Last 24 hours) at 03/25/2020 1253 Last data filed at 03/25/2020 0804 Gross per 24 hour  Intake 840 ml  Output --  Net 840 ml   Filed Weights   03/23/20 0415 03/24/20 0242 03/25/20 0324  Weight: 104.5 kg 104.3 kg 104.6 kg    Exam: Constitutional: NAD, awake, comfortable; sitting up in chair. Respiratory: clear to auscultation bilaterally, no wheezing, no crackles. Normal respiratory effort.  Cardiovascular: Irregular rate and rhythm w/ underlying atrial fibrillation, no murmurs / rubs / gallops. No extremity edema.  Abdomen: no tenderness, no masses palpated. Bowel sounds positive.  Musculoskeletal: no clubbing / cyanosis. Normal muscle tone.  Skin: no  rashes, lesions, ulcers. No induration.  Previous wrist lacerations well-healed Neurologic: CN 2-12 grossly intact. Sensation intact, DTR normal. Strength 5/5 x all 4 extremities.  Psychiatric: Alert and oriented x 3.  At times appears to have lack of insight into current medical status.     Assessment/Plan: Non-aneurysmal small left occipital intraparenchymal hemorrhage with subarachnoid extension/ Acute toxic  encephalopathy  -RESOLVED AND STABLE ON FULL DOSE ANTICOAGULATION [facility] 02/27/2020-FU HEAD CT UNREMARKABLE -Secondary to drug overdose with oxycodone and Restoril along with trauma in the light of anticoagulant (Xarelto) use  Reported history of Wernicke's encephalopathy/Dementia -Has also been followed by [name] psych at [facility]: Differential during evaluation September 2021 included Alzheimer's dementia, frontotemporal alcoholic dementia, pseudodementia with depression or possible vascular etiology. OF NOTE HIS MOTHER HAS SEVERE DEMENTIA -OP geriatric psych documentation patient has a history of polypharmacy and alcohol abuse although no documented alcohol intake since 2017 -September 2021 he was started on mirtazapine 15 mg HS by [name] psych -11/5: Per my discussion with patient's daughter he has been exhibiting symptoms of depression and dementia symptoms for some time against by poor hygiene refusing to bathe, inability to remember to take medications unless prepared for him, refusal of medications when offered, going to pharmacies and refilling medications that had previously been discontinued, apparent hallucinations with reports that the ceiling looks like a lava lamp, difficulty remembering names of people, familiar streets, list of items and incoherent thought processes as well as paranoia regarding belief that his family is stealing his money and belongings. -Initial evaluation in September at memory clinic at [facility] was done to rule out reversible causes with Alzheimer's dementia as primary differential  Suicide attempt - bilateral wrist laceration and intentional drug overdose -IP psych evaluated and recommended inpatient geri-psych placement-unfortunately [facility] does not have this type of bed available in our behavioral health inpatient unit -Continue suicide precautions with safety sitter at bedside-psychiatry documents high risk for suicide since lived alone  prior to admission -LCSW request routine psych follow-up every 72 hours per request of outside facilities.  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  Recommend continuing Lexapro (had previously been on Cymbalta but had reportedly self discontinued this medication prior to September visit with [facility] psychiatrist and was subsequently started on Remeron).  -During this hospitalization Remeron and melatonin were discontinued and Rozerem was started at bedtime. (OP indication for Remeron re: chronic insomnia) -10/27 patient requesting Cymbalta be renewed because previously prescribed to treat RLS along with Requip.  Patient has no recollection of telling providers he had voluntarily stopped this medication including conversations with providers at [facility] and at this facility since arrival. -10/27 DC'd Lexapro in favor of low-dose Cymbalta which will be slowly titrated up to previous dose of 60 mg/day -11/4 has tolerated Cymbalta without any evidence of nausea or vomiting  CHRONIC Insomnia/back pain -Continue Voltaren gel  -No NSAIDs PO due to borderline GFR -Continue Zanaflex at HS -Patient has been performing musculoskeletal stretches in the room which has also improved his back pain  CHRONIC RLS -Continue Cymbalta tittrate dose to to end point 60 mg -Continue Requip 2 mg q. HS  Protein calorie malnutrition Nutrition Status: Nutrition Problem: Inadequate oral intake (RESOLVED) Etiology: decreased appetite Signs/Symptoms: meal completion < 50% (RESOLVED) Interventions: Ensure Enlive (each supplement provides 350kcal and 20 grams of protein), MVI  Iron deficiency anemia -Likely  related to poor nutrition in context of recurrent nausea since June -Iron 38 and was given 1000 mg of IV iron on 10/21 -repeat anemia panel in 3-6  months  Chronic diastolic heart failure/pulmonary hypertension/TR -Euvolemic and compensated; cont I/O w/ daily wts -Has been followed by outpatient cardiology for this as well as atrial fibrillation -Echocardiogram 12/31/19 from [facility] instead preserved EF 55 to 60% with mild aortic regurgitation and mild to moderate TR with RVSP 42 mmHg and mild pulmonary hypertension, aortic sinus moderately dilated at 4.9 cm and a moderately dilated ascending aorta 5.0 cm  Chronic atrial fibrillation/flutter -Continue Xarelto -Rate controlled on metoprolol -Followed by cardiology and EP at [facility] clinics  Recent PE (July 2021) -Hospitalized in July of this year at [facility] non-STEMI characterized as demand ischemia at same time -CT at admission to [facility] in July: Small burden of distal segmental/subsegmental pulmonary emboli involving the left upper lobe. FU CT 3 days later: Small segmental/subsegmental pulmonary emboli in the left upper lobe described on recent PE study are is not well seen on today's exam.  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 Joan Mayans at [facility]; patient remains in observational status without any active treatments  Obesity/OSA -BMI 31.33  -Patient will need to follow-up with his PCP on discharge and discuss lifestyle modifications -It appears patient underwent sleep study in the past few months but apparently [employer] denied CPAP  Chronic nausea with cyclic vomiting 2/2 daily THC use POA -STABLE AND TOLERATING SOLID DIET -In review of outside records patient has been having these symptoms since June 2021 and according to daughter had been informed that his daily use of marijuana was the cause but patient refused to believe -Now that patient is in the hospital and does not have access to marijuana  his nausea has resolved -Tolerating regular diet after SLP evaluation including MBSS demonstrated no evidence of dysphagia or other swallowing abnormalities -EGD this admission with esophageal plaques pathology negative for Candida. Mild duodenal bulb erythema and mildly congested mucosa in the second end of the duodenum pathology demonstrating acute inflammation.  -Continue H2 blocker -11/5: Discontinue scheduled Zofran but continue Zofran prn -11/6: Has developed Nausea, received Zofran with improvement.  Chronic neck pain -Followed by Dr. Celedonio Miyamoto at the clinics at [facility] -Recent MRI with mild to moderate degenerative disc disease with mild to severe facet joint arthrosis contributing to multilevel mild to severe neural foraminal stenosis and mild to moderate acquired spinal canal stenosis -Recommendation for PT    Data Reviewed: Basic Metabolic Panel: Recent Labs  Lab 03/19/20 0513 03/20/20 0335 03/21/20 0244 03/22/20 0240  NA 139 138 137 139  K 4.4 4.4 4.9 5.1  CL 103 105 102 103  CO2 27 27 27 28   GLUCOSE 95 101* 103* 107*  BUN 21 26* 25* 25*  CREATININE 1.35* 1.26* 1.33* 1.33*  CALCIUM 9.3 9.1 9.7 9.5   Liver Function Tests: Recent Labs  Lab 03/19/20 0513 03/20/20 0335  AST 17 18  ALT 15 16  ALKPHOS 59 58  BILITOT 0.8 0.7  PROT 6.1* 6.1*  ALBUMIN 3.7 3.5   No results for input(s): LIPASE, AMYLASE in the last 168 hours. No results for input(s): AMMONIA in the last 168 hours. CBC: Recent Labs  Lab 03/19/20 0513 03/20/20 0335  WBC 13.0* 13.3*  NEUTROABS 6.7  7.4  HGB 12.1* 12.1*  HCT 38.4* 38.3*  MCV 89.3 89.7  PLT 166 161   Cardiac Enzymes: No results for input(s): CKTOTAL, CKMB, CKMBINDEX, TROPONINI in the last 168 hours. BNP (last 3 results) Recent Labs    02/21/20 1144  BNP 359.3*    ProBNP (last 3 results) No results for input(s): PROBNP in the last 8760 hours.  CBG: No results for input(s): GLUCAP in the  last 168 hours.  No results found for this or any previous visit (from the past 240 hour(s)).   Studies: No results found.  Scheduled Meds: . diclofenac Sodium  4 g Topical QID  . docusate sodium  100 mg Oral BID  . DULoxetine  60 mg Oral Daily  . famotidine  20 mg Oral BID  . feeding supplement  237 mL Oral BID BM  . metoprolol tartrate  12.5 mg Oral BID  . mometasone-formoterol  2 puff Inhalation BID  . multivitamin with minerals  1 tablet Oral Daily  . polyethylene glycol  17 g Oral Daily  . ramelteon  8 mg Oral QHS  . rivaroxaban  20 mg Oral QHS  . rOPINIRole  2 mg Oral QHS  . rosuvastatin  40 mg Oral Daily  . thiamine  100 mg Oral Daily  . 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    Shawna Clamp , MD [facility] Pager [phone]. If 7PM-7AM, please contact night-coverage at [email or web address] 03/25/2020, 12:53 PM  LOS: 33 days          .

## 2020-03-25 NOTE — Plan of Care (Signed)
  Problem: Safety: Goal: Ability to remain free from injury will improve Outcome: Progressing Safety sitter in room with patient   Problem: Nutrition: Goal: Adequate nutrition will be maintained Outcome: Not Progressing Patient is not eating his meals. He did drink his supplement this morning.

## 2020-03-26 ENCOUNTER — Other Ambulatory Visit (HOSPITAL_COMMUNITY): Payer: Self-pay | Admitting: Nurse Practitioner

## 2020-03-26 DIAGNOSIS — F4321 Adjustment disorder with depressed mood: Secondary | ICD-10-CM

## 2020-03-26 LAB — CBC
HCT: 38.8 % — ABNORMAL LOW (ref 39.0–52.0)
Hemoglobin: 12.3 g/dL — ABNORMAL LOW (ref 13.0–17.0)
MCH: 28.2 pg (ref 26.0–34.0)
MCHC: 31.7 g/dL (ref 30.0–36.0)
MCV: 89 fL (ref 80.0–100.0)
Platelets: 164 10*3/uL (ref 150–400)
RBC: 4.36 MIL/uL (ref 4.22–5.81)
RDW: 15.4 % (ref 11.5–15.5)
WBC: 10.6 10*3/uL — ABNORMAL HIGH (ref 4.0–10.5)
nRBC: 0 % (ref 0.0–0.2)

## 2020-03-26 LAB — BASIC METABOLIC PANEL
Anion gap: 8 (ref 5–15)
BUN: 17 mg/dL (ref 8–23)
CO2: 27 mmol/L (ref 22–32)
Calcium: 9.3 mg/dL (ref 8.9–10.3)
Chloride: 102 mmol/L (ref 98–111)
Creatinine, Ser: 1.13 mg/dL (ref 0.61–1.24)
GFR, Estimated: >60 mL/min (ref >60)
Glucose, Bld: 114 mg/dL — ABNORMAL HIGH (ref 70–99)
Potassium: 4 mmol/L (ref 3.5–5.1)
Sodium: 137 mmol/L (ref 135–145)

## 2020-03-26 MED ORDER — ROSUVASTATIN CALCIUM 40 MG PO TABS
40.0000 mg | ORAL_TABLET | Freq: Every day | ORAL | 4 refills | Status: DC
Start: 2020-03-26 — End: 2020-03-26

## 2020-03-26 MED ORDER — XARELTO 20 MG PO TABS
20.0000 mg | ORAL_TABLET | Freq: Every day | ORAL | 4 refills | Status: DC
Start: 2020-03-26 — End: 2020-03-26

## 2020-03-26 MED ORDER — TIZANIDINE HCL 4 MG PO TABS: 4.0000 mg | ORAL_TABLET | Freq: Four times a day (QID) | ORAL | 4 refills | Status: AC | PRN

## 2020-03-26 MED ORDER — CYANOCOBALAMIN 1000 MCG PO TABS
1000.0000 ug | ORAL_TABLET | Freq: Every day | ORAL | 4 refills | Status: DC
Start: 2020-03-26 — End: 2020-03-26

## 2020-03-26 MED ORDER — ONDANSETRON HCL 4 MG PO TABS
4.0000 mg | ORAL_TABLET | Freq: Four times a day (QID) | ORAL | Status: DC
  Administered 2020-03-26 – 2020-03-27 (×5): 4 mg via ORAL
  Filled 2020-03-26 (×5): qty 1

## 2020-03-26 MED ORDER — ADULT MULTIVITAMIN W/MINERALS CH: 1.0000 | ORAL_TABLET | Freq: Every day | ORAL | 4 refills | Status: AC

## 2020-03-26 MED ORDER — DULOXETINE HCL 60 MG PO CPEP
60.0000 mg | ORAL_CAPSULE | Freq: Every day | ORAL | 4 refills | Status: DC
Start: 2020-03-26 — End: 2020-03-26

## 2020-03-26 MED ORDER — BUDESONIDE-FORMOTEROL FUMARATE 160-4.5 MCG/ACT IN AERO: 2.0000 | INHALATION_SPRAY | Freq: Every day | RESPIRATORY_TRACT | 12 refills | Status: AC

## 2020-03-26 MED ORDER — ADULT MULTIVITAMIN W/MINERALS CH
1.0000 | ORAL_TABLET | Freq: Every day | ORAL | 4 refills | Status: DC
Start: 2020-03-27 — End: 2020-03-26

## 2020-03-26 MED ORDER — ACETAMINOPHEN 325 MG PO TABS: 650.0000 mg | ORAL_TABLET | Freq: Four times a day (QID) | ORAL | 0 refills | Status: AC | PRN

## 2020-03-26 MED ORDER — BUDESONIDE-FORMOTEROL FUMARATE 160-4.5 MCG/ACT IN AERO: 2.0000 | INHALATION_SPRAY | Freq: Every day | RESPIRATORY_TRACT | 12 refills | Status: DC

## 2020-03-26 MED ORDER — ROSUVASTATIN CALCIUM 40 MG PO TABS: 40.0000 mg | ORAL_TABLET | Freq: Every day | ORAL | 4 refills | Status: AC

## 2020-03-26 MED ORDER — METOPROLOL TARTRATE 25 MG PO TABS: 12.5000 mg | ORAL_TABLET | Freq: Two times a day (BID) | ORAL | 4 refills | Status: AC

## 2020-03-26 MED ORDER — DICLOFENAC SODIUM 1 % EX GEL: 4.0000 g | Freq: Four times a day (QID) | CUTANEOUS | 4 refills | Status: AC

## 2020-03-26 MED ORDER — PANTOPRAZOLE SODIUM 40 MG PO TBEC
40.0000 mg | DELAYED_RELEASE_TABLET | Freq: Every day | ORAL | 4 refills | Status: DC
Start: 2020-03-26 — End: 2020-03-26

## 2020-03-26 MED ORDER — ALBUTEROL SULFATE HFA 108 (90 BASE) MCG/ACT IN AERS
1.0000 | INHALATION_SPRAY | Freq: Four times a day (QID) | RESPIRATORY_TRACT | 4 refills | Status: AC | PRN
Start: 2020-03-26 — End: ?

## 2020-03-26 MED ORDER — ONDANSETRON HCL 4 MG PO TABS
4.0000 mg | ORAL_TABLET | Freq: Four times a day (QID) | ORAL | 4 refills | Status: DC
Start: 2020-03-26 — End: 2020-03-26

## 2020-03-26 MED ORDER — DULOXETINE HCL 60 MG PO CPEP: 60.0000 mg | ORAL_CAPSULE | Freq: Every day | ORAL | 4 refills | Status: AC

## 2020-03-26 MED ORDER — TIZANIDINE HCL 4 MG PO TABS: 4.0000 mg | ORAL_TABLET | Freq: Four times a day (QID) | ORAL | 4 refills | Status: DC | PRN

## 2020-03-26 MED ORDER — RAMELTEON 8 MG PO TABS
8.0000 mg | ORAL_TABLET | Freq: Every day | ORAL | 4 refills | Status: DC
Start: 2020-03-26 — End: 2020-03-26

## 2020-03-26 MED ORDER — ONDANSETRON HCL 4 MG PO TABS: 4.0000 mg | ORAL_TABLET | Freq: Four times a day (QID) | ORAL | 4 refills | Status: AC

## 2020-03-26 MED ORDER — DICLOFENAC SODIUM 1 % EX GEL
4.0000 g | Freq: Four times a day (QID) | CUTANEOUS | 4 refills | Status: DC
Start: 2020-03-26 — End: 2020-03-26

## 2020-03-26 MED ORDER — ACETAMINOPHEN 325 MG PO TABS: 650.0000 mg | ORAL_TABLET | Freq: Four times a day (QID) | ORAL | Status: DC | PRN

## 2020-03-26 MED ORDER — PANTOPRAZOLE SODIUM 40 MG PO TBEC
40.0000 mg | DELAYED_RELEASE_TABLET | Freq: Every day | ORAL | Status: DC
  Administered 2020-03-26 – 2020-03-27 (×2): 40 mg via ORAL
  Filled 2020-03-26 (×2): qty 1

## 2020-03-26 MED ORDER — FAMOTIDINE 20 MG PO TABS
20.0000 mg | ORAL_TABLET | Freq: Every day | ORAL | 4 refills | Status: DC
Start: 2020-03-26 — End: 2020-03-26

## 2020-03-26 MED ORDER — ALBUTEROL SULFATE HFA 108 (90 BASE) MCG/ACT IN AERS
1.0000 | INHALATION_SPRAY | Freq: Four times a day (QID) | RESPIRATORY_TRACT | 4 refills | Status: DC | PRN
Start: 2020-03-26 — End: 2020-03-26

## 2020-03-26 MED ORDER — FAMOTIDINE 20 MG PO TABS: 20.0000 mg | ORAL_TABLET | Freq: Every day | ORAL | 4 refills | Status: AC

## 2020-03-26 MED ORDER — THIAMINE HCL 500 MG PO TABS
1000.0000 mg | ORAL_TABLET | Freq: Every day | ORAL | 4 refills | Status: DC
Start: 2020-03-26 — End: 2020-03-26

## 2020-03-26 MED ORDER — THIAMINE HCL 500 MG PO TABS: 1000.0000 mg | ORAL_TABLET | Freq: Every day | ORAL | 4 refills | Status: AC

## 2020-03-26 MED ORDER — CYANOCOBALAMIN 1000 MCG PO TABS: 1000.0000 ug | ORAL_TABLET | Freq: Every day | ORAL | 4 refills | Status: AC

## 2020-03-26 MED ORDER — PANTOPRAZOLE SODIUM 40 MG PO TBEC: 40.0000 mg | DELAYED_RELEASE_TABLET | Freq: Every day | ORAL | 4 refills | Status: AC

## 2020-03-26 MED ORDER — METOPROLOL TARTRATE 25 MG PO TABS
12.5000 mg | ORAL_TABLET | Freq: Two times a day (BID) | ORAL | 4 refills | Status: DC
Start: 2020-03-26 — End: 2020-03-26

## 2020-03-26 MED ORDER — RAMELTEON 8 MG PO TABS: 8.0000 mg | ORAL_TABLET | Freq: Every day | ORAL | 4 refills | Status: AC

## 2020-03-26 MED FILL — XARELTO 20 MG TABLET: 20 | 30 days supply | Qty: 30 | Fill #0

## 2020-03-26 NOTE — Progress Notes (Signed)
[facility] PROGRESS NOTE  David Oneill CNO:[id number] DOB: Mar 14, 1956 DOA: 02/21/2020 PCP: David Oneill [facility])    10/21: David Oneill sitting up in chair after performing ADLs  Status: Inpatient-Remains inpatient appropriate because:Altered mental status and Unsafe d/c plan   Dispo: The patient is from: Home              Anticipated d/c is to: Inpatient Geri Psych unit              Anticipated d/c date is: > 3 days              Patient currently is medically stable to d/c.  David Oneill.  Needs Geripsych placement 2/2 presenting with suicide attempt-no geripsych beds available thru [facility].  Dr. Hulen Oneill has communicated with administration at [facility] who are working on obtaining a bed for the patient. **Extensive discussion with patient's daughter David Oneill on 11/5.  Outlined history as below.  Patient cannot go live with his mother in [place] because of her severe dementia.  He cannot come live with her given his previous behaviors and most recent suicide attempt in front of her children.  We discussed that ALF may be an option given his dementia and of dementia severe enough and he lacks capacity he may need to be placed on a locked dementia unit to prevent elopement  Code Status: Full Family Communication: Patient only DVT prophylaxis: Xarelto Vaccination status: Received first Covid vaccine on 7/8.  Patient requesting second dose.  Some concerns voiced by patient that PE in July may have been directly related to Covid vaccine.  Discussed with infectious disease physician Dr. Janit Oneill both agree that timing between patient's vaccine dose and presentation with PE symptoms not consistent with vaccine related coagulopathy (developed PE symptoms 48 hours after vaccine).  Also any concerns over possible activation of platelet antibodies related to vaccine would be ameliorated by current full dose anticoagulation for PE and A. fib.  HPI: 64 year old male admitted with suicidal intent and  presented with drug overdose with oxycodone and Restoril along with wrist lacerations.  Patient initially admitted by PCCM.  CT head was noted to have a small occipital intraparenchymal hemorrhage and subarachnoid extension.  Psychiatry consulted and recommended inpatient psych.  Patient now awaiting [facility] psych placement.  Subjective: Lengthy discussion held with patient today regarding findings and recommendation of psych team.  Patient confirmed that he does have funding in the form of thousand dollars a month check, he has a vehicle.  Emphasized to patient that unfortunately his daughter does not want him physically in the home although he can continue to call her and see if she is willing to at least talk.  Emphasized to patient that if he does return to [place] that he cannot live with his mother due to her severe dementia and requirement for caretaker in the home.  Also discussed with patient that given his issues with chronic nausea (that when at its worst is associated with vomiting) he should not use any more marijuana or CBD products given these can precipitate the symptoms in a certain subset of the population that utilizes THC regularly.  Patient did not agree with me regarding these findings stating he is still having mild nausea while here.  Explained that on Friday I had stopped his Zofran to see if he would have nausea and that some people will always have a degree of chronic nausea from regular THC use and also many of the medications he takes now may cause  mild nausea but as long as he is able to eat and drink this may be an unfortunate chronic side effect.  Reassured patient he would be prescribed Zofran at discharge.  Objective: Vitals:   03/26/20 0857 03/26/20 1029  BP:  135/74  Pulse:  68  Resp:  20  Temp:  97.8 F (36.6 C)  SpO2: 98% 100%    Intake/Output Summary (Last 24 hours) at 03/26/2020 1211 Last data filed at 03/26/2020 0551 Gross per 24 hour  Intake 720 ml  Output  1650 ml  Net -930 ml   Filed Weights   03/24/20 0242 03/25/20 0324 03/26/20 0214  Weight: 104.3 kg 104.6 kg 104.2 kg    Exam: Constitutional: NAD, awake, comfortable; sitting up in chair. Respiratory: clear to auscultation bilaterally, no wheezing, no crackles. Normal respiratory effort. No accessory muscle use.  Room air Cardiovascular: Irregular rate and rhythm w/ underlying atrial fibrillation, no murmurs / rubs / gallops. No extremity edema.  Abdomen: no tenderness, no masses palpated. Bowel sounds positive.  Musculoskeletal: no clubbing / cyanosis. Normal muscle tone.  Skin: no rashes, lesions, ulcers. No induration.  Previous wrist lacerations well-healed Neurologic: CN 2-12 grossly intact. Sensation intact, DTR normal. Strength 5/5 x all 4 extremities.  Psychiatric: Alert and oriented x 3.  Became somewhat agitated discussing preadmission events including excessive use of THC/THC based products prior to admission likely resulted in many of his GI symptoms he has been having for some time.  Assessment/Plan: Non-aneurysmal small left occipital intraparenchymal hemorrhage with subarachnoid extension/ Acute toxic encephalopathy  -RESOLVED AND STABLE ON FULL DOSE ANTICOAGULATION SINCW 02/27/2020-FU HEAD CT UNREMARKABLE -Secondary to drug overdose with oxycodone and Restoril along with trauma in the light of anticoagulant (Xarelto) use  Reported history of Wernicke's encephalopathy/suspected dementia -Has also been followed by David Oneill psych at [facility]: Differential during evaluation September 2021 included Alzheimer's dementia, frontotemporal alcoholic dementia, pseudodementia with depression or possible vascular etiology. OF NOTE HIS MOTHER HAS SEVERE DEMENTIA -OP geriatric psych documentation patient has a history of polypharmacy and alcohol abuse although no documented alcohol intake since 2017 -September 2021 he was started on mirtazapine 15 mg HS by David Oneill  psych -11/5: I waited by psychiatry.  Mini-Mental status exam not consistent with dementia.  Psych has cleared in regards to capacity and patient is otherwise ready for discharge once appropriate plan can be put into place.  Suicide attempt - bilateral wrist laceration and intentional drug overdose -IP psych evaluated and recommended inpatient geri-psych placement-unfortunately David Oneill does not have this type of bed available in our behavioral health inpatient unit-currently as of 11/5 psychiatry has deemed patient now appropriate for outpatient psychiatric follow-up he has capacity to make his own medical decisions -During this hospitalization preadmission Remeron and melatonin were discontinued and Rozerem was started at bedtime. (OP indication for Remeron re: chronic insomnia) -Continue Cymbalta to concurrently treat depression and restless leg syndrome  -11/4 has tolerated Cymbalta without any evidence of nausea or vomiting -Psychiatry will assist in providing patient resources regarding outpatient follow-up  CHRONIC Insomnia/back pain -Continue Voltaren gel  -No NSAIDs PO due to borderline GFR -Continue Zanaflex at HS -Patient has been performing musculoskeletal stretches in the room which has also improved his back pain  CHRONIC RLS -Continue Cymbalta 60 mg daily -Continue Requip 2 mg q. HS  Protein calorie malnutrition Nutrition Status: Nutrition Problem: Inadequate oral intake (RESOLVED) Etiology: decreased appetite Signs/Symptoms: meal completion < 50% (RESOLVED) Interventions: Ensure Enlive (each supplement provides 350kcal  and 20 grams of protein), MVI  Iron deficiency anemia -Likely related to poor nutrition in context of recurrent nausea since June -Iron 38 and was given 1000 mg of IV iron on 10/21 -repeat anemia panel in 3-6 months  Chronic diastolic heart failure/pulmonary hypertension/TR -Euvolemic and compensated; cont I/O w/ daily wts -Has been followed by  outpatient cardiology for this as well as atrial fibrillation -Echocardiogram 12/31/19 from [facility] instead preserved EF 55 to 60% with mild aortic regurgitation and mild to moderate TR with RVSP 42 mmHg and mild pulmonary hypertension, aortic sinus moderately dilated at 4.9 cm and a moderately dilated ascending aorta 5.0 cm  Chronic atrial fibrillation/flutter -Continue Xarelto -Rate controlled on metoprolol -Followed by cardiology and EP at [facility] clinics  Recent PE (July 2021) -Hospitalized in July of this year at [facility] non-STEMI characterized as demand ischemia at same time -CT at admission to [facility] in July: Small burden of distal segmental/subsegmental pulmonary emboli involving the left upper lobe. FU CT 3 days later: Small segmental/subsegmental pulmonary emboli in the left upper lobe described on recent PE study are is not well seen on today's exam.  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 Joan Mayans at [facility]; patient remains in observational status without any active treatments  Obesity/OSA -BMI 31.33  -Patient will need to follow-up with his PCP on discharge and discuss lifestyle modifications -It appears patient underwent sleep study in the past few months but apparently [employer] denied CPAP  Chronic nausea with cyclic vomiting 2/2 daily THC use POA -STABLE AND TOLERATING SOLID DIET -In review of outside records patient has been having these symptoms since June 2021 and according to daughter had been informed that his daily use of marijuana was the cause but patient refused to believe -Now that patient is in the hospital and does not have access to marijuana his nausea has resolved -Tolerating regular diet after SLP evaluation including MBSS demonstrated no evidence of dysphagia or other  swallowing abnormalities -EGD this admission with esophageal plaques pathology negative for Candida. Mild duodenal bulb erythema and mildly congested mucosa in the second end of the duodenum pathology demonstrating acute inflammation.  -Continue H2 blocker and add daily PPI (creatinine 1.13) -Plan is to continue scheduled Zofran at discharge  Chronic neck pain -Followed by Dr. Celedonio Miyamoto at the clinics at [facility] with mild to moderate degenerative disc disease with mild to severe facet joint arthrosis contributing to multilevel mild to severe neural foraminal stenosis and mild to moderate acquired spinal canal stenosis -Recommendation for PT    Data Reviewed: Basic Metabolic Panel: Recent Labs  Lab 03/20/20 0335 03/21/20 0244 03/22/20 0240 03/26/20 0824  NA 138 137 139 137  K 4.4 4.9 5.1 4.0  CL 105 102 103 102  CO2 27 27 28 27   GLUCOSE 101* 103* 107* 114*  BUN 26* 25* 25* 17  CREATININE 1.26* 1.33* 1.33* 1.13  CALCIUM 9.1 9.7 9.5 9.3   Liver Function Tests: Recent Labs  Lab 03/20/20 0335  AST 18  ALT 16  ALKPHOS 58  BILITOT 0.7  PROT 6.1*  ALBUMIN 3.5   No results for input(s): LIPASE, AMYLASE in the last 168 hours. No results for input(s): AMMONIA in the last 168 hours. CBC: Recent Labs  Lab 03/20/20 0335 03/26/20 0824  WBC 13.3* 10.6*  NEUTROABS  7.4  --   HGB 12.1* 12.3*  HCT 38.3* 38.8*  MCV 89.7 89.0  PLT 161 164   Cardiac Enzymes: No results for input(s): CKTOTAL, CKMB, CKMBINDEX, TROPONINI in the last 168 hours. BNP (last 3 results) Recent Labs    02/21/20 1144  BNP 359.3*    ProBNP (last 3 results) No results for input(s): PROBNP in the last 8760 hours.  CBG: No results for input(s): GLUCAP in the last 168 hours.  No results found for this or any previous visit (from the past 240 hour(s)).   Studies: No results found.  Scheduled Meds: . diclofenac Sodium  4 g Topical QID  . docusate sodium   100 mg Oral BID  . DULoxetine  60 mg Oral Daily  . famotidine  20 mg Oral BID  . feeding supplement  237 mL Oral BID BM  . metoprolol tartrate  12.5 mg Oral BID  . mometasone-formoterol  2 puff Inhalation BID  . multivitamin with minerals  1 tablet Oral Daily  . ondansetron  4 mg Oral Q6H  . polyethylene glycol  17 g Oral Daily  . ramelteon  8 mg Oral QHS  . rivaroxaban  20 mg Oral QHS  . rOPINIRole  2 mg Oral QHS  . rosuvastatin  40 mg Oral Daily  . thiamine  100 mg Oral Daily  . 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 If 7PM-7AM, please contact night-coverage at [email or web address] 03/26/2020, 12:11 PM  LOS: 34 days          .

## 2020-03-26 NOTE — Progress Notes (Signed)
Per Lissa Merlin NP request, shared with the patient that there is possibility that patient will be d/c tomorrow. Per NP family will be bring his belongings to the security and med's will be delivered by TOC. Shared this with patient per request. Confirmed one more time with NP and she confirmed again that this is the plan stating that she also shared information with the pateint this morning. Patient informed.

## 2020-03-26 NOTE — Progress Notes (Signed)
Pt had phone call with son this evening, he states "it did not go well I have no where to go"  Pt requesting to speak to social work in the morning  Will continue to monitor

## 2020-03-26 NOTE — TOC Progression Note (Signed)
Transition of Care [facility]) - Progression Note    Patient Details  Name: David Oneill MRN: [id number] Date of Birth: 14-Dec-1955  Transition of Care [facility]) CM/SW [place], [place] Phone Number: 03/26/2020, 3:34 PM  Clinical Narative:    CSW spoke with [name], pt's daughter, in reference to change in dc plans. CSW told [name] that pt has now been cleared by psych for outpatient psychiatry services. [name] stated she does not want pt at her home and if he returns she will call law enforcement. [name] stated she has already started packing pt's belongings. CSW requested that pt's belongings be bought to the hospital. Rubin Payor stated she will put as much in the truck as she could and bring the car to the ER entrance and take the keys to security. [name] confirmed that she doesn't have any access to pt's financial information but confirmed pt does receive a monthly check directly deposited. [name] stated pt has a debit card and a bank card and pt should have money in his account as he has been here in the hospital.         Expected Discharge Plan and Services                                                 Social Determinants of Health (SDOH) Interventions    Readmission Risk Interventions No flowsheet data found.

## 2020-03-27 NOTE — TOC Transition Note (Signed)
Transition of Care [facility]) - CM/SW Discharge Note   Patient Details  Name: David Oneill MRN: [id number] Date of Birth: [birth year]  Transition of Care [facility]) CM/SW Contact:  [address], [place] Phone Number: 03/27/2020, 11:40 AM   Clinical Narrative:    CSW spoke at length this morning with Clint Guy, sister of pt. Gasper Sells states pt can come and stay with her in [place] until he finds permanent housing. Gasper Sells states she has some concerns but nothing that will prevent pt from coming to live with her in [place]. On 03/26/20 pt's daughter Rubin Payor bought pt's truck with his phone and wallet (with $500) for pt to get to [place]. [name] confirmed today that pt has $1300 in the bank. CSW had a conversation with pt confirming that Gasper Sells would be allowing him to stay with her temporarily, pt was happy to hear this information. CSW advised to pt that his vehicle is parked outside of the ED entrance and that his wallet has $500 in it to help him get to [place]. Pt was appreciative. CSW reminded pt again that he is not able to go back to [place] home.            Patient Goals and CMS Choice        Discharge Placement                       Discharge Plan and Services                                     Social Determinants of Health (SDOH) Interventions     Readmission Risk Interventions No flowsheet data found.

## 2020-03-27 NOTE — Progress Notes (Signed)
Nutrition Follow-up  DOCUMENTATION CODES:   Obesity unspecified  INTERVENTION:   -ContinueEnsure Enlive po BID, each supplement provides 350 kcal and 20 grams of protein -Continue MVI with minerals daily -ContinueMagic cup TID with meals, each supplement provides 290 kcal and 9 grams of protein -RD will sign off secondary to medical stability; pt further nutrition-related concerns arise, please re-consult RD  NUTRITION DIAGNOSIS:   Inadequate oral intake related to decreased appetite as evidenced by meal completion < 50%.  Progressing  GOAL:   Patient will meet greater than or equal to 90% of their needs  Progressing   MONITOR:   PO intake, Supplement acceptance, Weight trends, Skin, Labs, I & O's  REASON FOR ASSESSMENT:   Malnutrition Screening Tool    ASSESSMENT:   Pt admitted with AMS 2/2 intentional drug OD and head trauma after fall. Pt also slit both wrists. PMH includes A.fib, s/p ablation x2, restrictive lung disease, OSA, HTN, large PEs 11/2019 prompting NSTEMI, CLL, and h/o EtOH abuse. 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  10/19- s/p EGD- revealed esophageal plaques suspicious for candidiasis (biopsied), mild duodenal bulb erythema and mildly congested mucosa in second portion of duodenum 10/20- s/p BSE- advanced to dysphagia 3 diet with thin liquids 10/21- s/p MBSS- advanced to regular diet with thin liquids 10/25- sutures removed from bilateral wrist wounds  Reviewed I/O's: -300 ml x 24 hours and -14 L since 03/13/20  UOP: 300 ml x 24 hours  Pt sleeping soundly at time of visit. RD did not disturb.   Pt remains with good appetite. Noted meal completion 100%. He is consuming Ensure Enlive supplements per MAR.   Per psychiatry notes, pt no longer requires inpatient psychiatric hospitalizations. Awaiting safe discharge disposition.   Medications reviewed and include colace, zofran, miraax, rozerm, xarelto,  and vitamin B-12.   Labs reviewed.   Diet Order:   Diet Order            Diet regular Room service appropriate? Yes; Fluid consistency: Thin  Diet effective now                 EDUCATION NEEDS:   Not appropriate for education at this time  Skin:  Skin Assessment: Skin Integrity Issues: Skin Integrity Issues:: Other (Comment) Other: lacerations bilateral wrists  Last BM:  03/26/20  Height:   Ht Readings from Last 1 Encounters:  02/28/20 6' (1.829 m)    Weight:   Wt Readings from Last 1 Encounters:  03/27/20 102.2 kg    Ideal Body Weight:  80.91 kg  BMI:  Body mass index is 30.57 kg/m.  Estimated Nutritional Needs:   Kcal:  2200-2400  Protein:  120-135 grams  Fluid:  > 2 L    David Oneill, RD, LDN, [place] Registered Dietitian II Certified Diabetes Care and Education Specialist Please refer to [facility] for RD and/or RD on-call/weekend/after hours pager

## 2020-03-27 NOTE — Progress Notes (Signed)
Sitter orders discontinued.  Patient is currently getting dressed. Xarelto medications given to patient.   Peripheral iv removed, dressing applied.   Pt has his car keys and shoes.

## 2020-03-27 NOTE — Discharge Summary (Addendum)
Physician Discharge Summary  David Oneill ZOX:[id number] DOB: 1956-04-12 DOA: 02/21/2020  PCP: Patient, No Pcp Per  Admit date: 02/21/2020  Discharge date: 03/27/2020  Time spent: >60 minutes  Recommendations for Outpatient Follow-up:  Patient plans to discharge to family in [place].  He will drive himself. Patient needs to establish with OP psychology/psychiatrist upon discharge. Patient will also need to establish with PCP to follow his chronic medical problems.  It is also recommended he establish with a cardiologist to follow his underlying cardiac issues. Patient's prescriptions have been electronically transmitted to a local pharmacy with multiple refills.  Since he is changing his residency back to [place] once he establishes with an appropriate pharmacy that is covered by his Medicare supplement plan these refills need to be transferred to that pharmacy. 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  Patient initially admitted by PCCM.  CT head was noted to have a small occipital intraparenchymal hemorrhage and subarachnoid extension.  Psychiatry consulted and initially recommended inpatient psych.  Subsequently patient has remained in the hospital for a total of 2 months and after initiation of appropriate pharmacotherapy psych has reevaluated the patient and he has been deemed appropriate for discharge home with outpatient follow-up.  He is no longer an imminent threat to self or others.  See below regarding other issues addressed during the hospitalization.  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  -OP geriatric psych documentation patient has a history of polypharmacy and alcohol abuse although no documented alcohol intake since 2017 -September 2021 he was started on mirtazapine 15 mg HS by [name] psych -11/5:  Evaluated by IP psychiatry.  Mini-Mental status exam not consistent with dementia.  Psych has cleared in regards to capacity and patient is otherwise ready for discharge  Suicide attempt - bilateral wrist laceration and intentional drug overdose -IP psych evaluated and initially recommended inpatient geri-psych placement -Continue Cymbalta to concurrently treat depression and restless leg syndrome  -Patient reevaluated by psychiatry on 11/6 and he was  deemed to have capacity.  Also the following recommendations were clearly documented: "No evidence of  imminent risk to self or others at present.   Patient does not meet criteria for psychiatric inpatient admission. Supportive therapy provided about ongoing stressors. Psychiatric service signing out. Re-consult as needed" -Please be aware that Dr. Darleene Cleaver is a well respected psychiatric provider in the community.  The patient has been reevaluated after 2 months of inpatient hospitalization.  Dr. Darleene Cleaver clearly documented that the patient is no longer an imminent threat to himself or others.  This concurs with our daily assessments of this patient.  On 11/8 Dr. Dwyane Dee with psychiatry also reviewed the chart and agreed with Dr. Marquis Buggy assessment.   CHRONIC Insomnia/back pain -Continue Voltaren gel, Zanaflex prn and continue musculoskeletal stretches   CHRONIC RLS -Continue Cymbalta 60 mg daily and Requip 2 mg q HS   Protein calorie malnutrition Nutrition Status: Resolved   Iron deficiency anemia -Likely related to poor nutrition in context of recurrent nausea since June -Iron 38 and was given 1000 mg of IV iron on 10/21 -repeat anemia panel in 3-6 months   Chronic diastolic heart failure/pulmonary hypertension/TR -Euvolemic and compensated; cont I/O w/ daily wts -Has been followed by outpatient cardiology for this as well as atrial fibrillation -Echocardiogram 12/31/19 from [facility] instead preserved EF 55 to 60% with mild aortic regurgitation and mild to moderate TR with RVSP 42 mmHg and mild pulmonary hypertension, aortic sinus moderately dilated at 4.9 cm and a moderately dilated ascending aorta 5.0 cm   Chronic atrial fibrillation/flutter -Continue Xarelto -Rate controlled on metoprolol -Followed by cardiology and EP at [facility] clinics   Recent PE (July 2021) -Hospitalized in July of this year at [facility] non-STEMI characterized as demand ischemia at  same time -CT at admission to [facility] in July: Small burden of distal segmental/subsegmental pulmonary emboli involving the left upper lobe. FU CT 3 days later: Small segmental/subsegmental pulmonary emboli in the left upper lobe described on recent PE study are is not well seen on today's exam.  -No hypoxia and was not on O2 prior to admission -Continue Xarelto   CHRONIC CLL -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 Joan Mayans at [facility]; patient remains in observational status without any active treatments   Obesity/OSA -BMI 31.33  -Patient will need to follow-up with his PCP on discharge and discuss lifestyle modifications -It appears patient underwent sleep study in the past few months but apparently [employer] denied CPAP   Chronic nausea with cyclic vomiting 2/2 daily THC use POA -STABLE AND TOLERATING SOLID DIET -In review of outside records patient has been having these symptoms since June 2021 and according to daughter had been informed that his daily use of marijuana was the cause but patient refused to believe -Now that patient is in the hospital and does not have access to marijuana his nausea has resolved -Tolerating regular diet after SLP evaluation including MBSS demonstrated no evidence of dysphagia or other swallowing abnormalities -EGD this admission with esophageal plaques pathology negative for Candida. Mild duodenal bulb erythema and mildly congested mucosa in the second end of the duodenum pathology demonstrating acute inflammation.  -Continue H2 blocker and add daily PPI (creatinine 1.13) -Continue scheduled Zofran at discharge   Chronic neck pain -Followed by Dr. Celedonio Miyamoto at the clinics at [facility] with mild to moderate degenerative disc disease with mild to severe facet joint arthrosis contributing to multilevel mild to severe neural foraminal stenosis and  mild to moderate acquired spinal  canal stenosis -Recommendation for PT     Procedures: 10/5 laceration repair in the ER 10/19 EGD  Consultations: PCCM Psychiatry Gastroenterology  Discharge Exam: Vitals:   03/27/20 0754 03/27/20 0823  BP: 127/64   Pulse: 68   Resp: 18   Temp: 98.3 F (36.8 C)   SpO2: 97% 99%   Constitutional: NAD, awake, comfortable Respiratory: clear to auscultation bilaterally, no wheezing, no crackles. Normal respiratory effort. No accessory muscle use.  Room air Cardiovascular: Irregular rate and rhythm w/ underlying atrial fibrillation, no murmurs / rubs / gallops. No extremity edema.  Abdomen: no tenderness, Bowel sounds positive.  Nausea controlled without emesis Neurologic: CN 2-12 grossly intact. Sensation intact, DTR normal. Strength 5/5 x all 4 extremities.  Ambulates easily Psychiatric: Alert and oriented x 3. Normal mood.  Able to verbalize discharge plan and is encouraged that he can live with family in [place] and be able to visit with his mother.   Discharge Instructions Discharge Instructions     (HEART FAILURE PATIENTS) Call MD:  Anytime you have any of the following symptoms: 1) 3 pound weight gain in 24 hours or 5 pounds in 1 week 2) shortness of breath, with or without a dry hacking cough 3) swelling in the hands, feet or stomach 4) if you have to sleep on extra pillows at night in order to breathe.   Complete by: As directed    Call MD for:  extreme fatigue   Complete by: As directed    Call MD for:  persistant dizziness or light-headedness   Complete by: As directed    Call MD for:  persistant nausea and vomiting   Complete by: As directed    Call MD for:  temperature >100.4   Complete by: As directed    Diet - low sodium heart healthy   Complete by: As directed    Discharge instructions   Complete by: As directed    Please pick up all of your prescriptions that were electronically transmitted to [facility] in [place] [address]  You have multiple refills available on all medicines that have been transmitted to [facility].  Once you arrive to [place] it is recommended that you establish with a pharmacy that is covered by your Medicare supplement and have these medications transferred when sure availability of medications over the long-term until you can establish with multiple providers.  Please make sure you establish with a local psychiatrist, internal medicine provider and a cardiologist after you arrive to [place]  As previously discussed you have a sensitivity to cannabis/THC products including CBD edibles and smokables.  This is led to you having issues with chronic nausea.  Please avoid these substances in the future.  You have been prescribed Zofran to use it orally to help minimize your chronic nausea.  You have also been started on Pepcid and will be continued on Protonix to help with your nausea as well.  Thank you for allowing [place] to participate in your care it has been a pleasure taking care of you!   Increase activity slowly   Complete by: As directed    No wound care   Complete by: As directed       Allergies as of 03/27/2020   No Known Allergies      Medication List     STOP taking these medications    aspirin 81 MG EC tablet   fluticasone 50 MCG/ACT nasal spray Commonly known as: FLONASE   ondansetron  4 MG disintegrating tablet Commonly known as: ZOFRAN-ODT   oxyCODONE 5 MG immediate release tablet Commonly known as: Oxy IR/ROXICODONE   promethazine 25 MG tablet Commonly known as: PHENERGAN   rOPINIRole 1 MG tablet Commonly known as: REQUIP       TAKE these medications    acetaminophen 325 MG tablet Commonly known as: TYLENOL Take 2 tablets (650 mg total) by mouth every 6 (six) hours as needed for mild pain, moderate pain, fever or headache. What changed:  how much to take when to take this reasons to take this   albuterol 108 (90 Base) MCG/ACT  inhaler Commonly known as: VENTOLIN HFA Inhale 1-2 puffs into the lungs every 6 (six) hours as needed for wheezing or shortness of breath.   budesonide-formoterol 160-4.5 MCG/ACT inhaler Commonly known as: SYMBICORT Inhale 2 puffs into the lungs daily.   cyanocobalamin 1000 MCG tablet Take 1 tablet (1,000 mcg total) by mouth daily.   diclofenac Sodium 1 % Gel Commonly known as: VOLTAREN Apply 4 g topically 4 (four) times daily.   DULoxetine 60 MG capsule Commonly known as: CYMBALTA Take 1 capsule (60 mg total) by mouth daily.   famotidine 20 MG tablet Commonly known as: PEPCID Take 1 tablet (20 mg total) by mouth daily.   melatonin 3 MG Tabs tablet Take 3 mg by mouth.   metoprolol tartrate 25 MG tablet Commonly known as: LOPRESSOR Take 0.5 tablets (12.5 mg total) by mouth 2 (two) times daily. What changed: when to take this   multivitamin with minerals Tabs tablet Take 1 tablet by mouth daily.   ondansetron 4 MG tablet Commonly known as: ZOFRAN Take 1 tablet (4 mg total) by mouth every 6 (six) hours.   pantoprazole 40 MG tablet Commonly known as: PROTONIX Take 1 tablet (40 mg total) by mouth daily.   ramelteon 8 MG tablet Commonly known as: ROZEREM Take 1 tablet (8 mg total) by mouth at bedtime.   rosuvastatin 40 MG tablet Commonly known as: CRESTOR Take 1 tablet (40 mg total) by mouth daily.   thiamine 500 MG tablet Take 2 tablets (1,000 mg total) by mouth daily.   tiZANidine 4 MG tablet Commonly known as: ZANAFLEX Take 1 tablet (4 mg total) by mouth every 6 (six) hours as needed for muscle spasms. What changed:  how much to take when to take this   Xarelto 20 MG Tabs tablet Generic drug: rivaroxaban Take 1 tablet (20 mg total) by mouth at bedtime.       No Known Allergies  Follow-up Information     Psychiatrist. Schedule an appointment as soon as possible for a visit in 2 week(s).   Why: Call to establish with a local psychiatrist in [place].   Recommend evaluation with in 2 weeks after arrival        Internal medicine. Call in 2 week(s).   Why: Please call to establish as a new patient with an internal medicine physician.  It is recommended that you attempt to obtain an appointment within 2 to 4 weeks after arrival        Cardiologist. Call in 2 month(s).   Why: Please call to establish with a local cardiologist after you arrive to [place].  It is recommended you be seen in 2 to 3 months after arrival to [place]                 The results of significant diagnostics from this hospitalization (including imaging, microbiology, ancillary and laboratory) are  listed below for reference.    Significant Diagnostic Studies: DG CHEST PORT 1 VIEW  Result Date: 03/02/2020 CLINICAL DATA:  Shortness of breath. History of congestive heart failure. EXAM: PORTABLE CHEST 1 VIEW COMPARISON:  Single-view of the chest 02/22/2020 and 02/21/2020. FINDINGS: Lungs clear. Heart size upper normal. No pneumothorax or pleural effusion. No acute or focal bony abnormality. IMPRESSION: No acute disease. Electronically Signed   By: Inge Rise M.D.   On: 03/02/2020 15:39   DG Swallowing Func-Speech Pathology  Result Date: 03/08/2020 Objective Swallowing Evaluation: Type of Study: MBS-Modified Barium Swallow Study  Patient Details Name: David Oneill MRN: [id number] Date of Birth: Nov 16, 1955 Today's Date: 03/08/2020 Time: SLP Start Time (ACUTE ONLY): 1036 -SLP Stop Time (ACUTE ONLY): 1052 SLP Time Calculation (min) (ACUTE ONLY): 16 min Past Medical History: Past Medical History: Diagnosis Date  Chronic back pain   Sleep apnea   Suicide attempt ([place]) 02/22/2020 Past Surgical History: Past Surgical History: Procedure Laterality Date  ABLATION OF DYSRHYTHMIC FOCUS    BIOPSY  03/06/2020  Procedure: BIOPSY;  Surgeon: Ladene Artist, MD;  Location: [facility] ENDOSCOPY;  Service: Gastroenterology;;  ESOPHAGOGASTRODUODENOSCOPY N/A 03/06/2020  Procedure:  ESOPHAGOGASTRODUODENOSCOPY (EGD);  Surgeon: Ladene Artist, MD;  Location: [facility];  Service: Gastroenterology;  Laterality: N/A; HPI: Pt is a 64 y.o. male with history of A. fib, on Xarelto, s/p ablation x 2, restrictive lung disease, OSA, HTN,  Large PEs 11/2019 prompting an NSTEMI, started on heparin and subsequently developed a chest hematoma, CLL with baseline white count of 20-30, history of alcohol abuse. He was diagnosed with likely early stages of Alzheimer's versus possible dementia per [facility] and also has  a history of Wernicke's encephalopathy diagnosed early 2020. He presented on 10/5 for evaluation of suicidal intent, homicidal ideation, and drug overdose. Pt slit both wrists (on Xarelto) and  reportedly overdosed on his Oxycodone and Restoril. He fell and hit the back of his head on his daughters porch. CT Head shows  small occipital bleed with subarachnoid extension. CXR 10/15 negative for acute changes. EGD showed esophageal plaques, suspicious for candidiasis, biopsied.  Mild duodenal bulb erythema and mildly congested mucosa in the second and of the duodenum.  No clear cause of nausea and vomiting.  No data recorded Assessment / Plan / Recommendation CHL IP CLINICAL IMPRESSIONS 03/08/2020 Clinical Impression Pt was seen in radiology suite for modified barium swallow study. Trials of puree solids, regular texture solids, mixed consistency boluses, a 61mm barium tablet, and thin liquids via straw were administered. [name] oropharyngeal swallow mechanism was within functional limits. Similar behaviors, such as an exaggerated posterior head tilt, were noted during the study without evidence of physiological necessity. Pt does have cervical osteophytes at C4 and C5 which push the posterior pharyngeal wall anteriorly, but did not significantly impact bolus flow. The possibility of pt's report of foods "not going down" being due to the sensation of boluses passing the cervical  osteophytes is considered. [name] diet may be advanced to regular texture solids per pt's preference and GI's recommendations. Pt was educated regarding results and recommendations and verbalized understanding. Further skilled SLP services are not clinically indicated at this time.  SLP Visit Diagnosis Dysphagia, unspecified (R13.10) Attention and concentration deficit following -- Frontal lobe and executive function deficit following -- Impact on safety and function No limitations   CHL IP TREATMENT RECOMMENDATION 03/08/2020 Treatment Recommendations No treatment recommended at this time   Prognosis 03/08/2020 Prognosis for Safe Diet Advancement Good Barriers to  Reach Goals Cognitive deficits Barriers/Prognosis Comment -- CHL IP DIET RECOMMENDATION 03/08/2020 SLP Diet Recommendations Regular solids;Thin liquid Liquid Administration via Cup;Straw Medication Administration Whole meds with liquid Compensations -- Postural Changes Seated upright at 90 degrees   CHL IP OTHER RECOMMENDATIONS 03/08/2020 Recommended Consults -- Oral Care Recommendations Oral care BID Other Recommendations --   CHL IP FOLLOW UP RECOMMENDATIONS 03/08/2020 Follow up Recommendations (No Data)   No flowsheet data found.     CHL IP ORAL PHASE 03/08/2020 Oral Phase WFL Oral - Pudding Teaspoon -- Oral - Pudding Cup -- Oral - Honey Teaspoon -- Oral - Honey Cup -- Oral - Nectar Teaspoon -- Oral - Nectar Cup -- Oral - Nectar Straw -- Oral - Thin Teaspoon -- Oral - Thin Cup -- Oral - Thin Straw -- Oral - Puree -- Oral - Mech Soft -- Oral - Regular -- Oral - Multi-Consistency -- Oral - Pill -- Oral Phase - Comment --  CHL IP PHARYNGEAL PHASE 03/08/2020 Pharyngeal Phase WFL Pharyngeal- Pudding Teaspoon -- Pharyngeal -- Pharyngeal- Pudding Cup -- Pharyngeal -- Pharyngeal- Honey Teaspoon -- Pharyngeal -- Pharyngeal- Honey Cup -- Pharyngeal -- Pharyngeal- Nectar Teaspoon -- Pharyngeal -- Pharyngeal- Nectar Cup -- Pharyngeal -- Pharyngeal- Nectar Straw --  Pharyngeal -- Pharyngeal- Thin Teaspoon -- Pharyngeal -- Pharyngeal- Thin Cup -- Pharyngeal -- Pharyngeal- Thin Straw -- Pharyngeal -- Pharyngeal- Puree -- Pharyngeal -- Pharyngeal- Mechanical Soft -- Pharyngeal -- Pharyngeal- Regular -- Pharyngeal -- Pharyngeal- Multi-consistency -- Pharyngeal -- Pharyngeal- Pill -- Pharyngeal -- Pharyngeal Comment --  CHL IP CERVICAL ESOPHAGEAL PHASE 03/08/2020 Cervical Esophageal Phase WFL Pudding Teaspoon -- Pudding Cup -- Honey Teaspoon -- Honey Cup -- Nectar Teaspoon -- Nectar Cup -- Nectar Straw -- Thin Teaspoon -- Thin Cup -- Thin Straw -- Puree -- Mechanical Soft -- Regular -- Multi-consistency -- Pill -- Cervical Esophageal Comment -- Shanika I. [name], [place], [place] Office number [phone] Pager [phone]; Horton Marshall 03/08/2020, 2:44 PM               Microbiology: No results found for this or any previous visit (from the past 240 hour(s)).   Labs: Basic Metabolic Panel: Recent Labs  Lab 03/21/20 0244 03/22/20 0240 03/26/20 0824  NA 137 139 137  K 4.9 5.1 4.0  CL 102 103 102  CO2 27 28 27   GLUCOSE 103* 107* 114*  BUN 25* 25* 17  CREATININE 1.33* 1.33* 1.13  CALCIUM 9.7 9.5 9.3   Liver Function Tests: No results for input(s): AST, ALT, ALKPHOS, BILITOT, PROT, ALBUMIN in the last 168 hours. No results for input(s): LIPASE, AMYLASE in the last 168 hours. No results for input(s): AMMONIA in the last 168 hours. CBC: Recent Labs  Lab 03/26/20 0824  WBC 10.6*  HGB 12.3*  HCT 38.8*  MCV 89.0  PLT 164   Cardiac Enzymes: No results for input(s): CKTOTAL, CKMB, CKMBINDEX, TROPONINI in the last 168 hours. BNP: BNP (last 3 results) Recent Labs    02/21/20 1144  BNP 359.3*    ProBNP (last 3 results) No results for input(s): PROBNP in the last 8760 hours.  CBG: No results for input(s): GLUCAP in the last 168 hours.  Agree with above NP note. He is no longer an imminent threat to self or  others. All the labs, imaging , consult reviewed. Seen and examined independently. Patient is being discharged home.   Signed:  Erin Hearing ANP [facility] 03/27/2020, 11:18 AM

## 2020-03-27 NOTE — Progress Notes (Signed)
D/C instructions given and reviewed. No questions asked but encouraged to call with any concerns. 

## 2021-06-04 IMAGING — DX DG CHEST 1V PORT
1 series · 1 of 1 positions shown · non-contrast
Comparison: 02/21/2020

CLINICAL DATA: Cardiomegaly.

EXAM:
PORTABLE CHEST 1 VIEW

[chest ap]
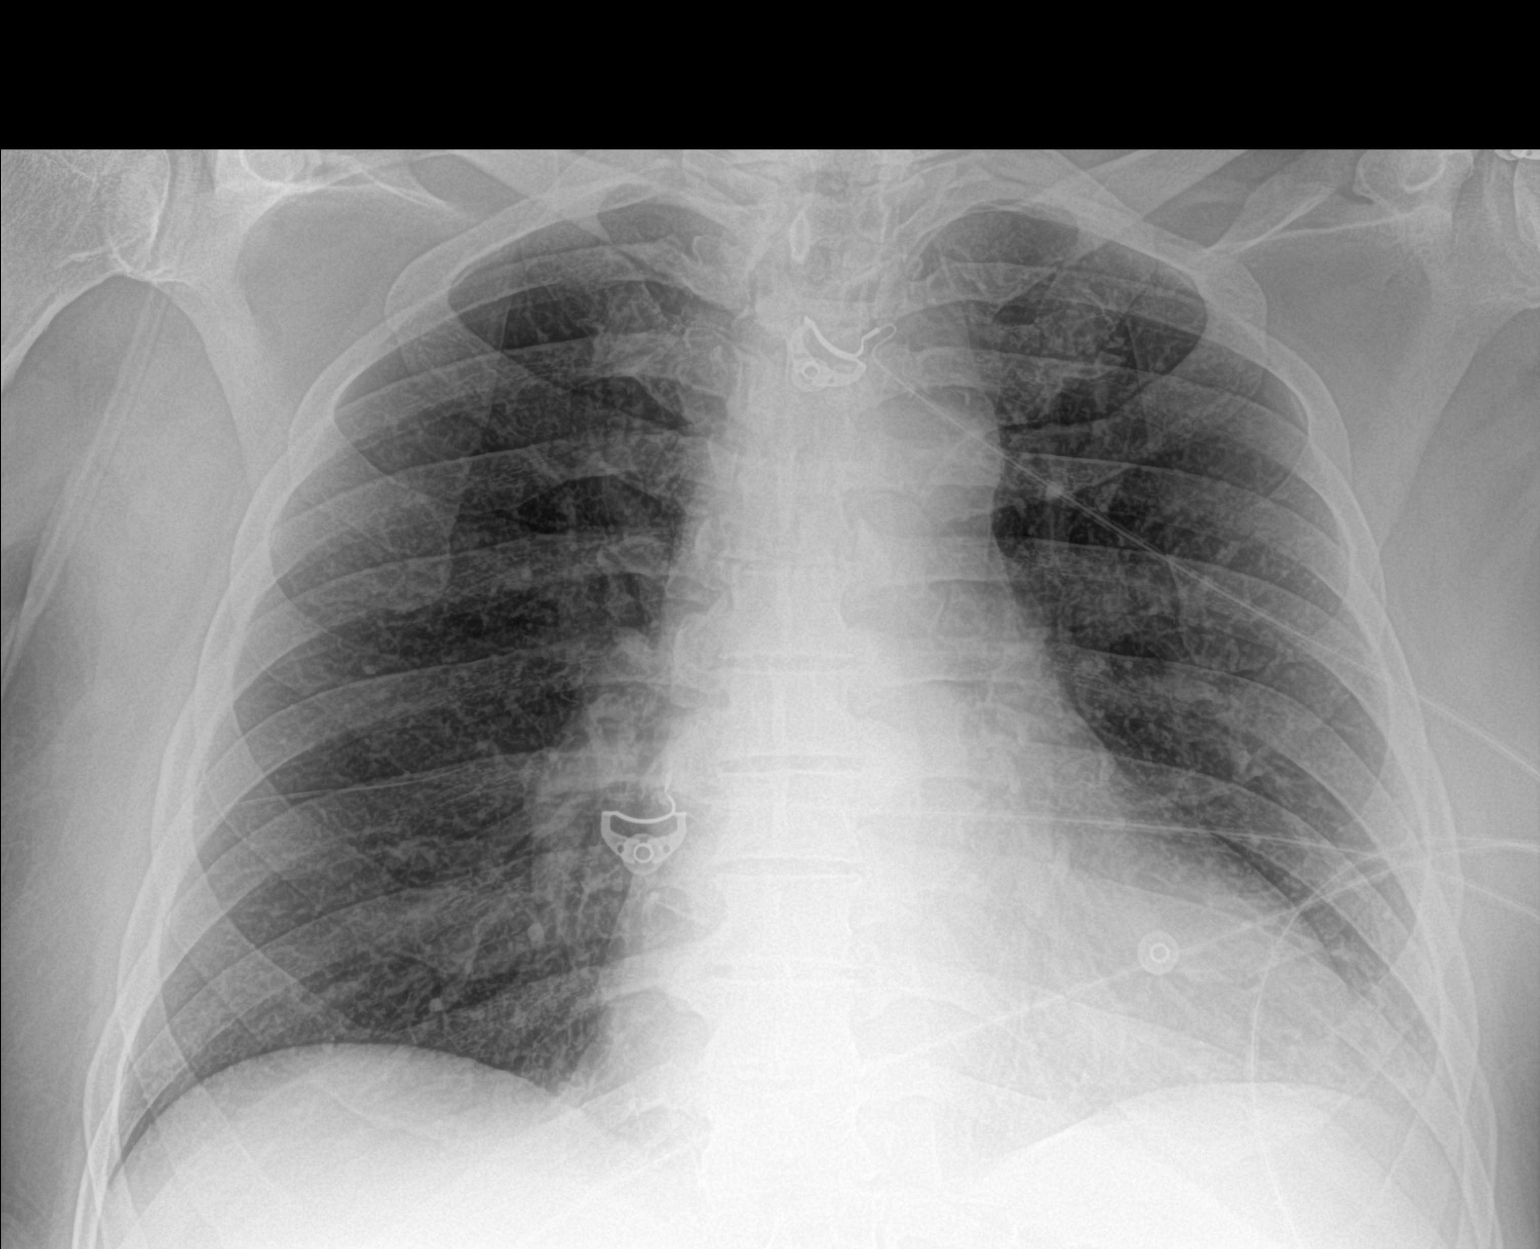

[1 of 1 positions shown; findings below may reference images not displayed]

FINDINGS: 2603 hours. The lungs are clear without focal pneumonia, edema,
pneumothorax or pleural effusion. Cardiopericardial silhouette is at
upper limits of normal for size. Hazy opacity at the cardiac apex
compatible with fat pad as seen on CT of 02/20/2020.
IMPRESSION: No active disease.

## 2021-06-04 IMAGING — CT CT HEAD W/O CM
3 series · 15 of 47 positions shown, 18 images · non-contrast
Comparison: CT head 02/21/2020

CLINICAL DATA: Follow-up intracranial hemorrhage.

EXAM:
CT HEAD WITHOUT CONTRAST
TECHNIQUE: Contiguous axial images were obtained from the base of the skull
through the vertex without intravenous contrast.

[Series 3: head 5.0 h30s · axial · 0.45mm/px · z∈[-167,-27]mm · 9 of 34 slices shown, 12 images]
[im 3/34  brain]
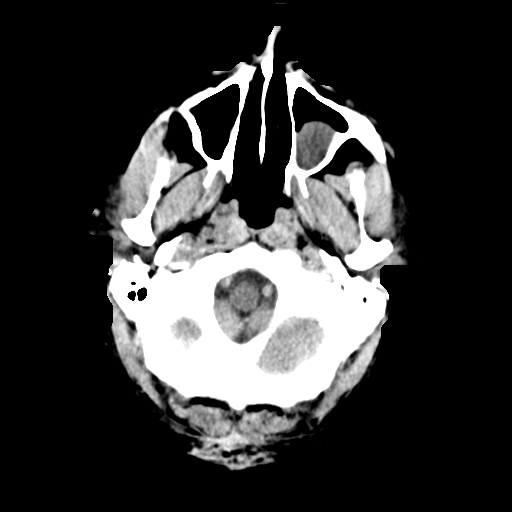
[im 3/34  bone]
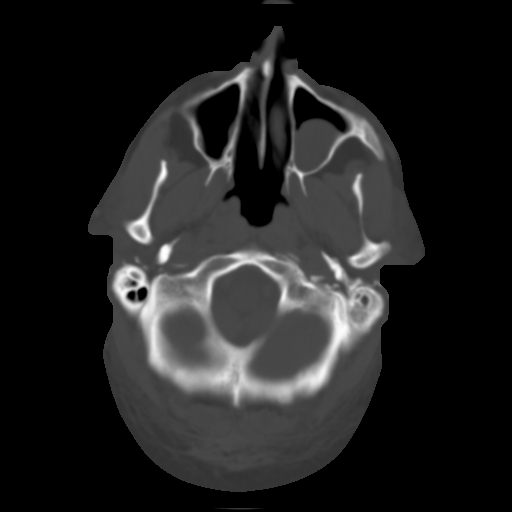
[im 6/34  brain]
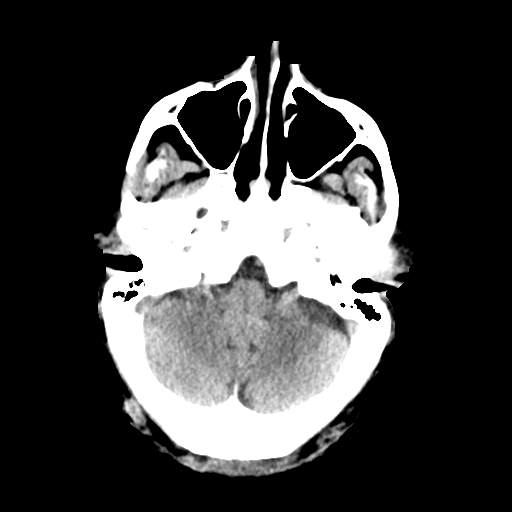
[im 10/34  brain]
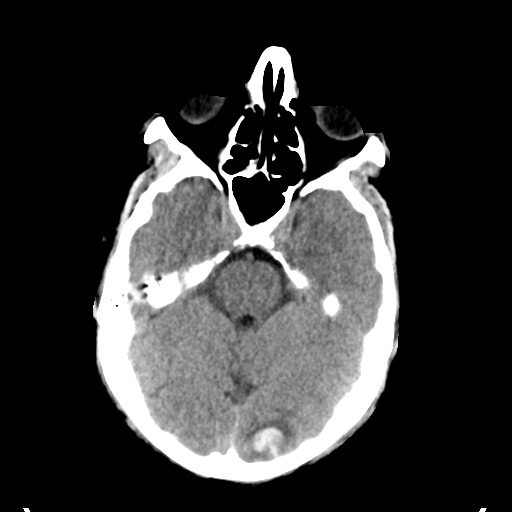
[im 13/34  brain]
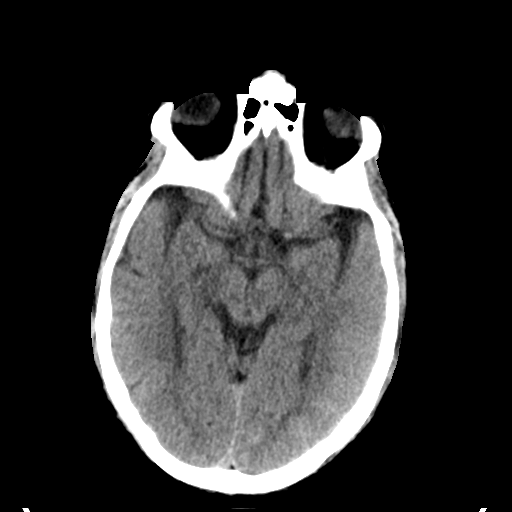
[im 18/34  brain]
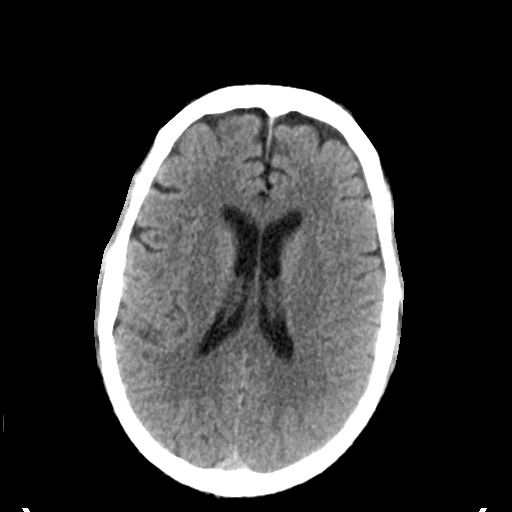
[im 18/34  bone]
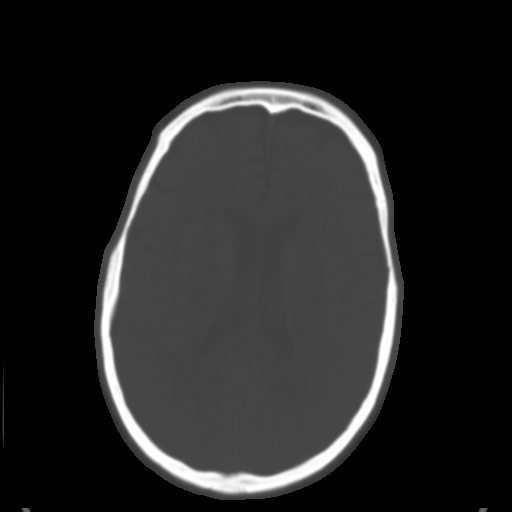
[im 21/34  brain]
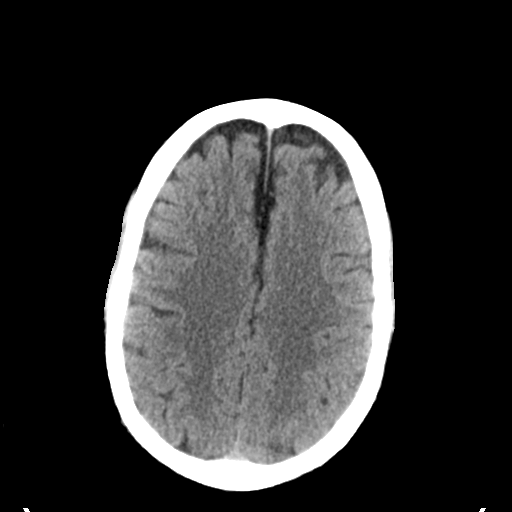
[im 24/34  brain]
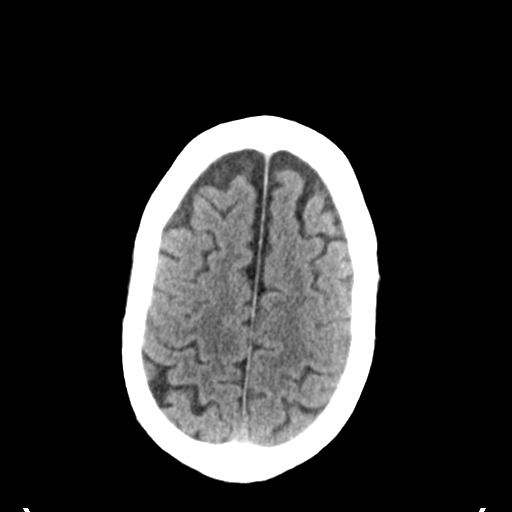
[im 28/34  brain]
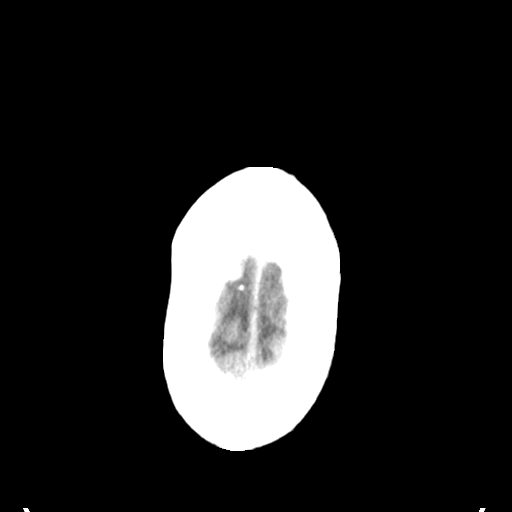
[im 31/34  brain]
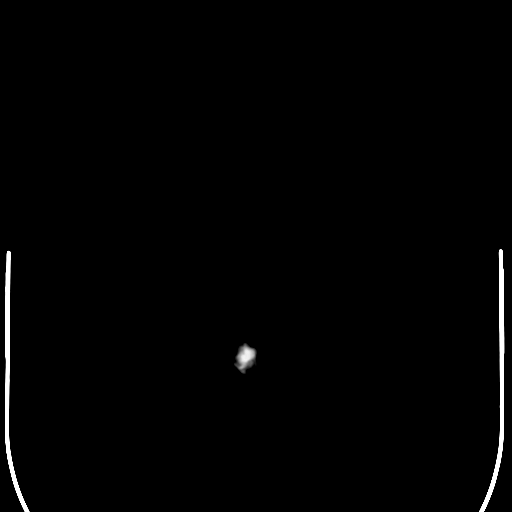
[im 31/34  bone]
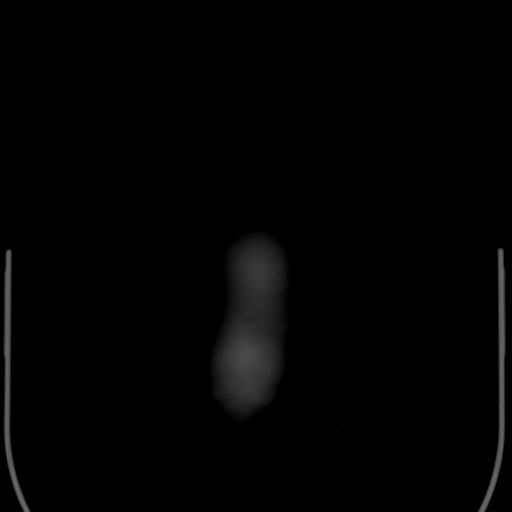

[Series 5: head 3.0 mpr cor · coronal · 0.33mm/px · 3 of 74 slices shown]
[im 25/74  brain]
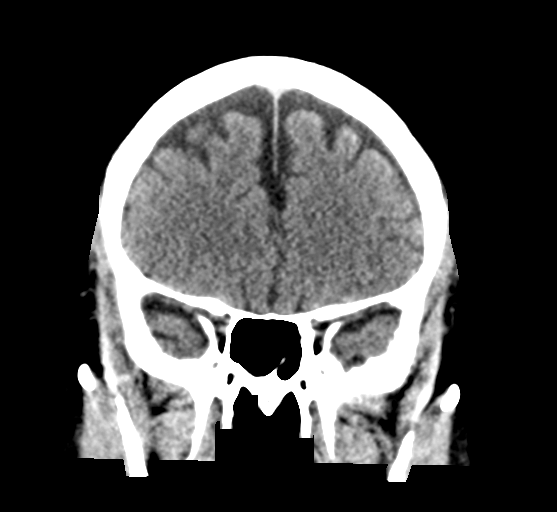
[im 33/74  brain]
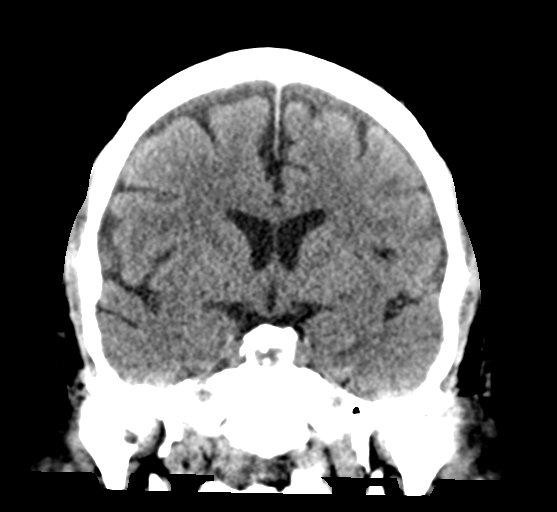
[im 41/74  brain]
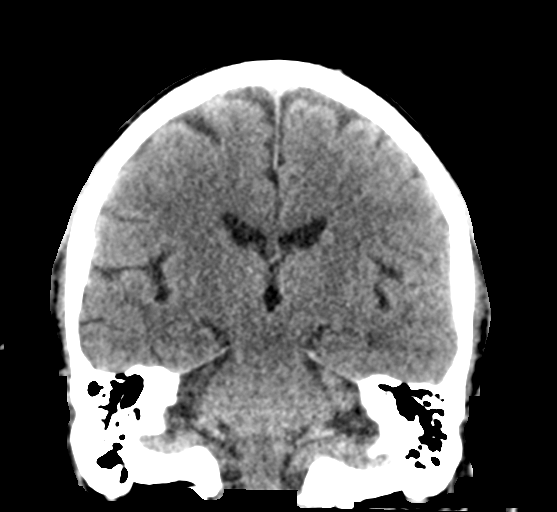

[Series 6: head 3.0 mpr sag · sagittal · 0.37mm/px · 3 of 67 slices shown]
[im 23/67  brain]
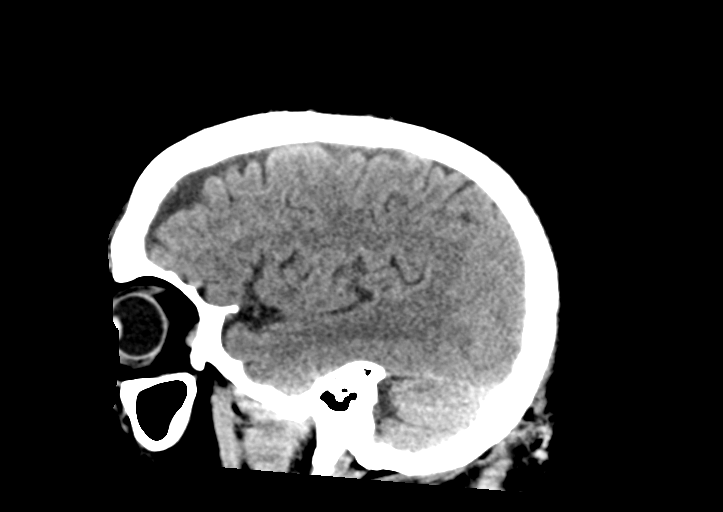
[im 34/67  brain]
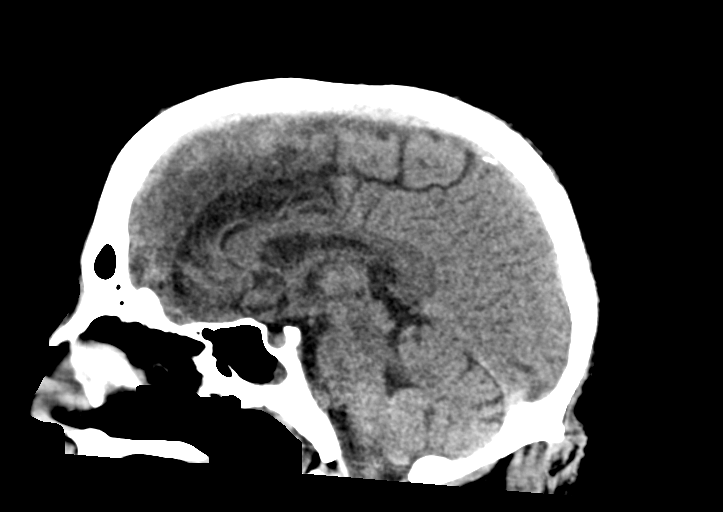
[im 45/67  brain]
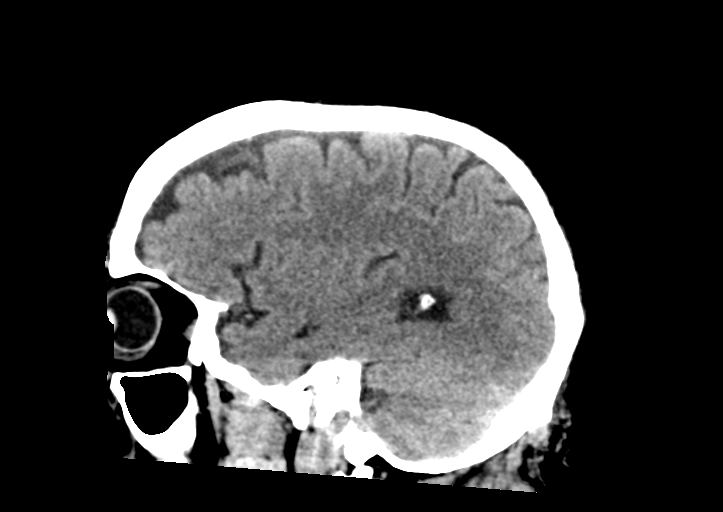

[15 of 47 positions shown; findings below may reference images not displayed]

FINDINGS: Brain: No substantial change in size/appearance of a superficial
left occipital hematoma measuring approximatelyr 18 mm. Similar
small volume of adjacent subarachnoid hemorrhage. Similar
surrounding edema. No evidence of new hemorrhage. No midline shift.
Basal cisterns are patent. No evidence of acute large vascular
territory infarct. No hydrocephalus.

Vascular: No hyperdense vessel or unexpected calcification. Calcific
atherosclerosis.

Skull: Normal. Negative for fracture or focal lesion.

Sinuses/Orbits: Left maxillary sinus retention cyst. Frothy
secretions in the right maxillary sinus and sphenoid sinuses, and
scattered ethmoid air cells.
IMPRESSION: Similar size/appearance of an 18 mm left occipital hematoma with
similar small volume subarachnoid hemorrhage extension. No evidence
of new hemorrhage.

## 2021-06-13 IMAGING — DX DG CHEST 1V PORT
1 series · 1 of 1 positions shown · non-contrast
Comparison: Single-view of the chest 02/22/2020 and 02/21/2020.

CLINICAL DATA: Shortness of breath. History of congestive heart
failure.

EXAM:
PORTABLE CHEST 1 VIEW

[chest ap]
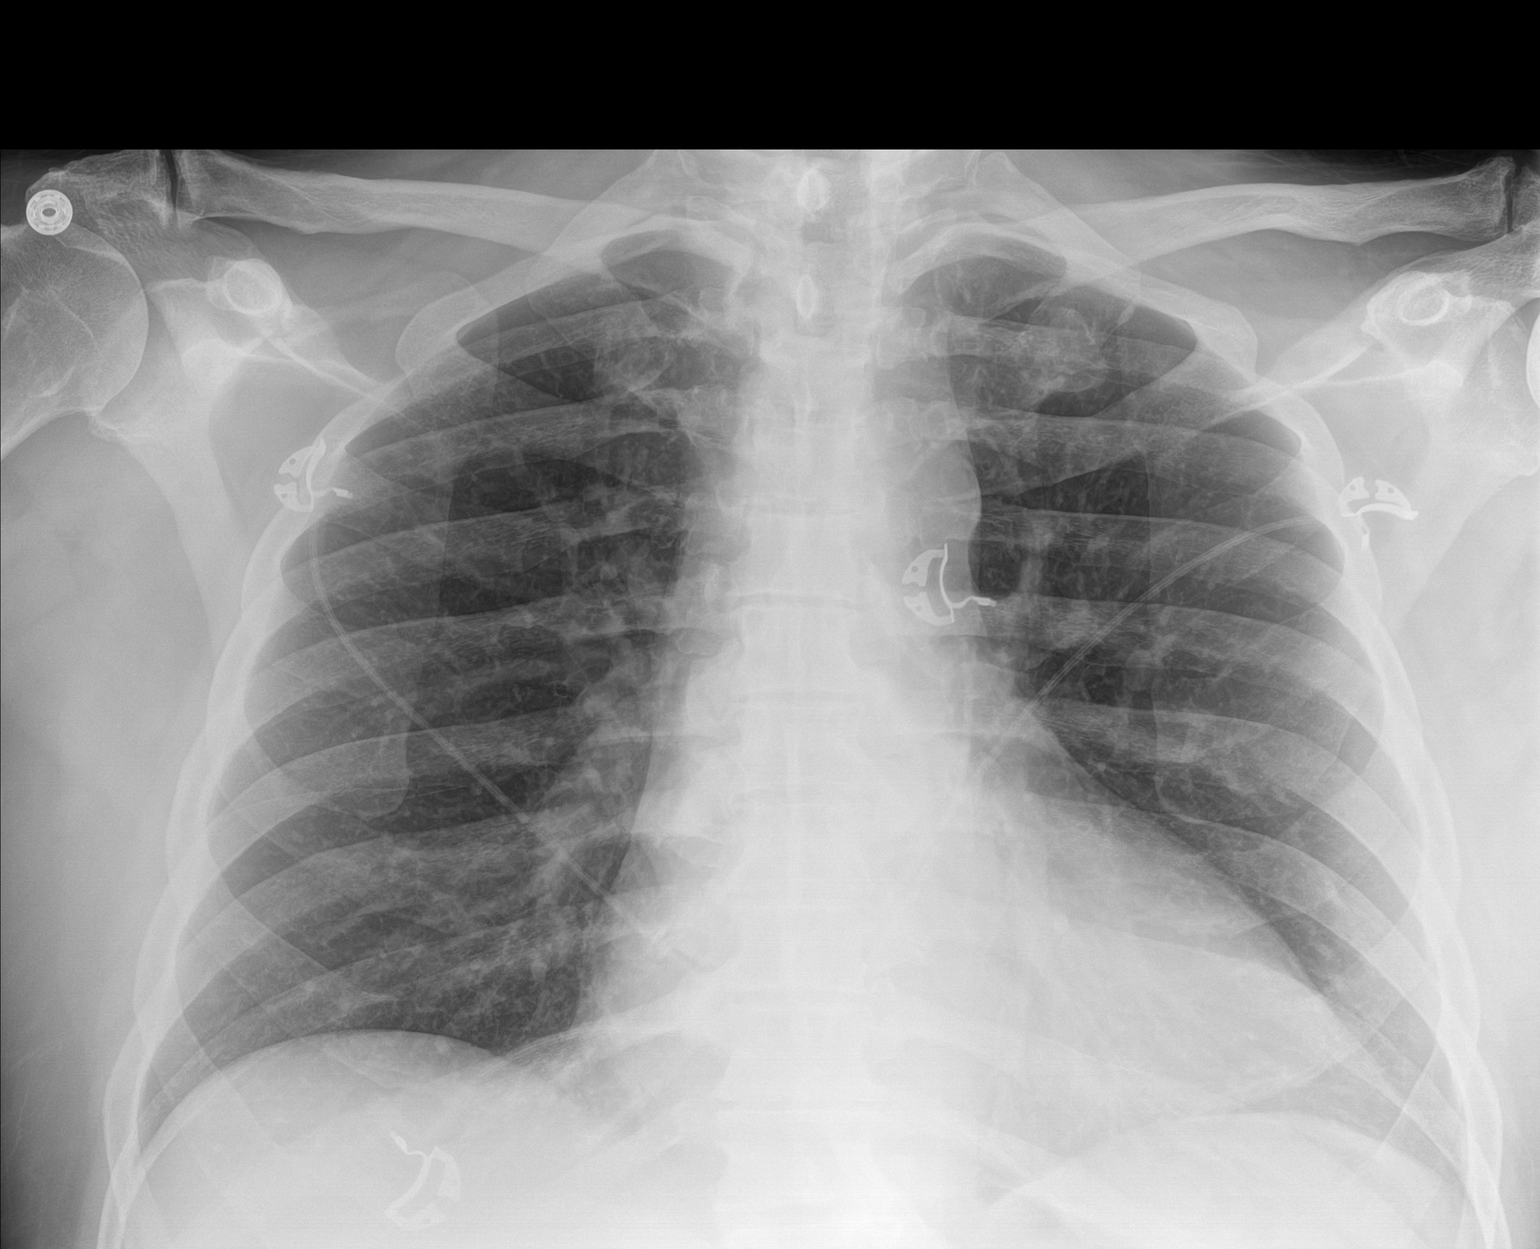

[1 of 1 positions shown; findings below may reference images not displayed]

FINDINGS: Lungs clear. Heart size upper normal. No pneumothorax or pleural
effusion. No acute or focal bony abnormality.
IMPRESSION: No acute disease.

## 2022-01-24 ENCOUNTER — Other Ambulatory Visit: Payer: Self-pay

## 2022-02-21 ENCOUNTER — Other Ambulatory Visit (HOSPITAL_COMMUNITY): Payer: Self-pay

## 2023-05-20 DEATH — deceased
